# Patient Record
Sex: Female | Born: 1935 | Race: White | Hispanic: No | Smoking: Never smoker
Health system: Southern US, Community
[De-identification: ages and names within clinical notes are randomized; demographics above are authoritative.]

## PROBLEM LIST (undated history)

## (undated) DIAGNOSIS — R140 Abdominal distension (gaseous): Secondary | ICD-10-CM

## (undated) DIAGNOSIS — I10 Essential (primary) hypertension: Secondary | ICD-10-CM

## (undated) DIAGNOSIS — F32A Depression, unspecified: Secondary | ICD-10-CM

## (undated) DIAGNOSIS — H269 Unspecified cataract: Secondary | ICD-10-CM

## (undated) DIAGNOSIS — D649 Anemia, unspecified: Secondary | ICD-10-CM

## (undated) DIAGNOSIS — T7840XA Allergy, unspecified, initial encounter: Secondary | ICD-10-CM

## (undated) DIAGNOSIS — Z5189 Encounter for other specified aftercare: Secondary | ICD-10-CM

## (undated) DIAGNOSIS — F329 Major depressive disorder, single episode, unspecified: Secondary | ICD-10-CM

## (undated) DIAGNOSIS — K625 Hemorrhage of anus and rectum: Secondary | ICD-10-CM

## (undated) DIAGNOSIS — E039 Hypothyroidism, unspecified: Secondary | ICD-10-CM

## (undated) DIAGNOSIS — J449 Chronic obstructive pulmonary disease, unspecified: Secondary | ICD-10-CM

## (undated) DIAGNOSIS — IMO0001 Reserved for inherently not codable concepts without codable children: Secondary | ICD-10-CM

## (undated) DIAGNOSIS — C189 Malignant neoplasm of colon, unspecified: Secondary | ICD-10-CM

## (undated) DIAGNOSIS — F419 Anxiety disorder, unspecified: Secondary | ICD-10-CM

## (undated) DIAGNOSIS — E785 Hyperlipidemia, unspecified: Secondary | ICD-10-CM

## (undated) HISTORY — DX: Hyperlipidemia, unspecified: E78.5

## (undated) HISTORY — PX: TONSILLECTOMY: SUR1361

## (undated) HISTORY — DX: Hemorrhage of anus and rectum: K62.5

## (undated) HISTORY — DX: Allergy, unspecified, initial encounter: T78.40XA

## (undated) HISTORY — DX: Encounter for other specified aftercare: Z51.89

## (undated) HISTORY — DX: Abdominal distension (gaseous): R14.0

## (undated) HISTORY — DX: Unspecified cataract: H26.9

## (undated) HISTORY — DX: Anxiety disorder, unspecified: F41.9

## (undated) HISTORY — PX: BAND HEMORRHOIDECTOMY: SHX1213

## (undated) HISTORY — DX: Depression, unspecified: F32.A

## (undated) HISTORY — DX: Anemia, unspecified: D64.9

## (undated) HISTORY — DX: Reserved for inherently not codable concepts without codable children: IMO0001

## (undated) HISTORY — DX: Chronic obstructive pulmonary disease, unspecified: J44.9

## (undated) HISTORY — PX: COLON SURGERY: SHX602

## (undated) HISTORY — DX: Essential (primary) hypertension: I10

## (undated) HISTORY — DX: Major depressive disorder, single episode, unspecified: F32.9

---

## 1999-12-06 ENCOUNTER — Emergency Department (HOSPITAL_COMMUNITY): Admission: EM | Admit: 1999-12-06 | Discharge: 1999-12-06 | Payer: Self-pay | Admitting: Emergency Medicine

## 1999-12-06 ENCOUNTER — Encounter: Payer: Self-pay | Admitting: Emergency Medicine

## 2000-12-09 ENCOUNTER — Encounter: Admission: RE | Admit: 2000-12-09 | Discharge: 2000-12-09 | Payer: Self-pay

## 2006-05-24 HISTORY — PX: CATARACT EXTRACTION W/ INTRAOCULAR LENS IMPLANT: SHX1309

## 2007-05-25 DIAGNOSIS — Z5189 Encounter for other specified aftercare: Secondary | ICD-10-CM

## 2007-05-25 HISTORY — DX: Encounter for other specified aftercare: Z51.89

## 2009-04-04 ENCOUNTER — Encounter: Admission: RE | Admit: 2009-04-04 | Discharge: 2009-04-04 | Payer: Self-pay | Admitting: Family Medicine

## 2010-04-06 ENCOUNTER — Encounter: Admission: RE | Admit: 2010-04-06 | Discharge: 2010-04-06 | Payer: Self-pay | Admitting: Family Medicine

## 2010-06-15 ENCOUNTER — Encounter: Payer: Self-pay | Admitting: Family Medicine

## 2011-05-25 HISTORY — PX: RIGHT COLECTOMY: SHX853

## 2011-05-28 ENCOUNTER — Other Ambulatory Visit (INDEPENDENT_AMBULATORY_CARE_PROVIDER_SITE_OTHER): Payer: Self-pay

## 2011-05-28 DIAGNOSIS — Z719 Counseling, unspecified: Secondary | ICD-10-CM

## 2011-05-28 DIAGNOSIS — K639 Disease of intestine, unspecified: Secondary | ICD-10-CM

## 2011-05-28 DIAGNOSIS — D259 Leiomyoma of uterus, unspecified: Secondary | ICD-10-CM

## 2011-05-31 ENCOUNTER — Encounter: Payer: Self-pay | Admitting: Gastroenterology

## 2011-05-31 ENCOUNTER — Telehealth (INDEPENDENT_AMBULATORY_CARE_PROVIDER_SITE_OTHER): Payer: Self-pay

## 2011-05-31 NOTE — Telephone Encounter (Signed)
Called Susan Reed to give new appointment date & time, patient will bring CD with imaging from hospital visit last week.  Dr. Elesa Hacker will fax over office notes, diagnostic studies, lab's, etc.  Per Dr. Biagio Quint patient referral to Gastroenterologist and Gynecologist have been ordered and given to our referral coordinator (05/28/2011)

## 2011-06-02 ENCOUNTER — Encounter (INDEPENDENT_AMBULATORY_CARE_PROVIDER_SITE_OTHER): Payer: Self-pay | Admitting: General Surgery

## 2011-06-02 ENCOUNTER — Ambulatory Visit (INDEPENDENT_AMBULATORY_CARE_PROVIDER_SITE_OTHER): Payer: Medicaid Other | Admitting: General Surgery

## 2011-06-02 VITALS — BP 152/74 | HR 80 | Temp 97.3°F | Resp 16 | Ht 61.0 in | Wt 122.4 lb

## 2011-06-02 DIAGNOSIS — K639 Disease of intestine, unspecified: Secondary | ICD-10-CM

## 2011-06-02 DIAGNOSIS — Z789 Other specified health status: Secondary | ICD-10-CM

## 2011-06-02 DIAGNOSIS — Z9189 Other specified personal risk factors, not elsewhere classified: Secondary | ICD-10-CM

## 2011-06-02 DIAGNOSIS — K6389 Other specified diseases of intestine: Secondary | ICD-10-CM

## 2011-06-02 DIAGNOSIS — C189 Malignant neoplasm of colon, unspecified: Secondary | ICD-10-CM

## 2011-06-02 NOTE — Progress Notes (Signed)
Patient ID: Susan Reed, female   DOB: [birth year], 78 y.o.   MRN: [id number]  Chief Complaint  Patient presents with  . Other    new pt- eval cecal mass    HPI Susan Reed is a 78 y.o. female.   HPI   This patient was referred by Dr. Elesa Hacker for evaluation of a right colon mass and cecal mass which was found on CT scan of the abdomen to evaluate for abdominal pain and diarrhea at the current residual emergency room. She states that she has been feeling well until November when she began having diarrhea off and on and she felt that this was due to appreciate or her recent travel to [place]. She has had some black diarrhea as well and she felt that this was due to her current supplementation which she takes for treatment of chronic anemia. She saw her physician who on physical exam noted some right lower quadrant abdominal pain and she was sent to the [facility] ER for evaluation and to rule out appendicitis. CT scan of the abdomen demonstrated thickening of the wall of the cecum with some mesenteric lymph nodes in the right lower quadrant which were enlarged and this was concerning for cecal cancer. She states that she left had a colonoscopy in 2009 but this was not a complete colonoscopy as she describes a poor prep and they were unable to complete the procedure. No followup was recommended for the patient. She denies any family history of colon cancer. She ha bloateds complained of some abdominal pain since November which she describes as "bloating" Past Medical History  Diagnosis Date  . Blood transfusion   . Anemia   . COPD (chronic obstructive pulmonary disease)   . Hyperlipidemia   . Osteoporosis   . Thyroid disease     Past Surgical History  Procedure Date  . Tonsillectomy     Family History  Problem Relation Age of Onset  . Heart disease Father   . Cancer Sister     pt unaware of what kind, but knows it was female reproductive area  . Cancer Brother     lymphoma     Social History History  Substance Use Topics  . Smoking status: Never Smoker   . Smokeless tobacco: Not on file  . Alcohol Use: Yes    Allergies  Allergen Reactions  . Codeine Nausea And Vomiting    Current Outpatient Prescriptions  Medication Sig Dispense Refill  . alendronate (FOSAMAX) 70 MG tablet Take 70 mg by mouth every 7 (seven) days. Take with a full glass of water on an empty stomach.      . Calcium Carbonate-Vit D-Min (CALTRATE PLUS PO) Take by mouth daily.      . Cholecalciferol (VITAMIN D-3 PO) Take by mouth daily.      . ciprofloxacin (CIPRO) 500 MG tablet Take 500 mg by mouth 2 (two) times daily.      . cycloSPORINE (RESTASIS) 0.05 % ophthalmic emulsion 1 drop 2 (two) times daily.      . dorzolamide (TRUSOPT) 2 % ophthalmic solution 1 drop 2 (two) times daily.      . fish oil-omega-3 fatty acids 1000 MG capsule Take 2 g by mouth daily.      . IRON PO Take 65 mg by mouth daily.      Marland Kitchen levothyroxine (SYNTHROID, LEVOTHROID) 25 MCG tablet Take 25 mcg by mouth daily.      Marland Kitchen losartan (COZAAR) 25 MG tablet Take 25 mg by  mouth daily.      . metroNIDAZOLE (FLAGYL) 250 MG tablet Take 250 mg by mouth 2 (two) times daily.      . Multiple Vitamin (MULTIVITAMIN) capsule Take 1 capsule by mouth daily.      Marland Kitchen aspirin 81 MG tablet Take 160 mg by mouth daily.        Review of Systems Review of Systems All other review of systems negative or noncontributory except as stated in the HPI  Blood pressure 152/74, pulse 80, temperature 97.3 F (36.3 C), temperature source Temporal, resp. rate 16, height 5\' 1"  (1.549 m), weight 122 lb 6.4 oz (55.52 kg).  Physical Exam Physical Exam  Vitals reviewed. Constitutional: She is oriented to person, place, and time. She appears well-developed and well-nourished. No distress.  HENT:  Head: Normocephalic and atraumatic.  Mouth/Throat: No oropharyngeal exudate.  Eyes: Conjunctivae are normal. Pupils are equal, round, and reactive to light.  Right eye exhibits no discharge. Left eye exhibits no discharge. No scleral icterus.  Neck: Normal range of motion. No tracheal deviation present.  Cardiovascular: Normal rate, regular rhythm and normal heart sounds.   Pulmonary/Chest: Effort normal and breath sounds normal. No stridor. No respiratory distress. She has no wheezes. She has no rales. She exhibits no tenderness.  Abdominal: Soft. Bowel sounds are normal. She exhibits mass. She exhibits no distension. There is tenderness. There is no rebound and no guarding.       Mild tenderness and palpable mass just inferior and to the right of the umbilicus   Musculoskeletal: Normal range of motion. She exhibits no edema.  Neurological: She is alert and oriented to person, place, and time.  Skin: Skin is warm and dry. No rash noted. She is not diaphoretic. No erythema. No pallor.  Psychiatric: She has a normal mood and affect. Her behavior is normal. Judgment and thought content normal.    Data Reviewed CT, labs  Assessment    Cecal mass.  Likely due to malignancy however, we do not have any pathologic confirmation of this. I recommended GI evaluation and she is due to see Dr. Arlyce Dice tomorrow for further evaluation and hopefully colonoscopy in the near future to obtain biopsies. I will also check a CEA level and given her uterine mass and her family history of uterine cancer in her sister and we also requested that she have a a GYN evaluation.    Plan    She will follow up with me in approximately 2 weeks after her GI evaluation and GYN evaluation. If this is indeed a colon mass or malignancy we will proceed with right colectomy. We discussed the surgery and the risks and we will plan for laparoscopic right hemicolectomy if this is indeed a malignancy.       Lodema Pilot DAVID 06/02/2011, 6:17 PM

## 2011-06-03 ENCOUNTER — Other Ambulatory Visit: Payer: Medicare Other

## 2011-06-03 ENCOUNTER — Encounter: Payer: Self-pay | Admitting: Gastroenterology

## 2011-06-03 ENCOUNTER — Ambulatory Visit (INDEPENDENT_AMBULATORY_CARE_PROVIDER_SITE_OTHER): Payer: Medicare Other | Admitting: Gastroenterology

## 2011-06-03 VITALS — BP 124/62 | HR 60 | Ht 61.0 in | Wt 122.0 lb

## 2011-06-03 DIAGNOSIS — K6389 Other specified diseases of intestine: Secondary | ICD-10-CM

## 2011-06-03 DIAGNOSIS — C189 Malignant neoplasm of colon, unspecified: Secondary | ICD-10-CM

## 2011-06-03 DIAGNOSIS — J449 Chronic obstructive pulmonary disease, unspecified: Secondary | ICD-10-CM | POA: Insufficient documentation

## 2011-06-03 DIAGNOSIS — R933 Abnormal findings on diagnostic imaging of other parts of digestive tract: Secondary | ICD-10-CM

## 2011-06-03 MED ORDER — PEG-KCL-NACL-NASULF-NA ASC-C 100 G PO SOLR: 1.0000 | Freq: Once | ORAL | Status: DC

## 2011-06-03 NOTE — Progress Notes (Signed)
History of Present Illness: Susan Reed is a pleasant 78 year old referred at the request of Dr. Nicanor Alcon for evaluation of a cecal mass. Dark stools prompted evaluation at her family practitioner's office resulting in an abdominal CT, which I reviewed, which demonstrates a mass along the medial wall of the asscending colon and cecum suggestive of a neoplasm. Regional lymphadenopathy is also seen. She denies abdominal pain or frank bleeding. Stools are not fully formed. She is taking iron.    Past Medical History  Diagnosis Date  . Blood transfusion   . Anemia   . COPD (chronic obstructive pulmonary disease)   . Hyperlipidemia   . Osteoporosis   . Thyroid disease   . Hypertension   . Glaucoma    Past Surgical History  Procedure Date  . Tonsillectomy    family history includes Cancer in her brother and sister and Heart disease in her father.  There is no history of Colon cancer. Current Outpatient Prescriptions  Medication Sig Dispense Refill  . alendronate (FOSAMAX) 70 MG tablet Take 70 mg by mouth every 7 (seven) days. Take with a full glass of water on an empty stomach.      . Calcium Carbonate-Vit D-Min (CALTRATE PLUS PO) Take by mouth daily.      . Cholecalciferol (VITAMIN D-3 PO) Take by mouth daily.      . ciprofloxacin (CIPRO) 500 MG tablet Take 500 mg by mouth 2 (two) times daily.      . cycloSPORINE (RESTASIS) 0.05 % ophthalmic emulsion 1 drop 2 (two) times daily.      . dorzolamide (TRUSOPT) 2 % ophthalmic solution 1 drop 2 (two) times daily.      . fish oil-omega-3 fatty acids 1000 MG capsule Take 2 g by mouth daily.      . IRON PO Take 65 mg by mouth daily.      Marland Kitchen levothyroxine (SYNTHROID, LEVOTHROID) 25 MCG tablet Take 25 mcg by mouth daily.      Marland Kitchen losartan (COZAAR) 25 MG tablet Take 25 mg by mouth daily.      . metroNIDAZOLE (FLAGYL) 250 MG tablet Take 250 mg by mouth 2 (two) times daily.      . Multiple Vitamin (MULTIVITAMIN) capsule Take 1 capsule by mouth daily.       Marland Kitchen aspirin 81 MG tablet Take 160 mg by mouth daily. ON HOLD       Allergies as of 06/03/2011 - Review Complete 06/03/2011  Allergen Reaction Noted  . Codeine Nausea And Vomiting 06/02/2011    reports that she has never smoked. She has never used smokeless tobacco. She reports that she drinks alcohol. She reports that she does not use illicit drugs.     Review of Systems: Pertinent positive and negative review of systems were noted in the above HPI section. All other review of systems were otherwise negative.  Vital signs were reviewed in today's medical record Physical Exam: General: Well developed , well nourished, no acute distress Head: Normocephalic and atraumatic Eyes:  sclerae anicteric, EOMI Ears: Normal auditory acuity Mouth: No deformity or lesions Neck: Supple, no masses or thyromegaly Lungs: Clear throughout to auscultation Heart: Regular rate and rhythm; no murmurs, rubs or bruits Abdomen: Soft, non tender and non distended. No masses, hepatosplenomegaly or hernias noted. Normal Bowel sounds Rectal:deferred Musculoskeletal: Symmetrical with no gross deformities  Skin: No lesions on visible extremities Pulses:  Normal pulses noted Extremities: No clubbing, cyanosis, edema or deformities noted Neurological: Alert oriented x 4, grossly nonfocal  Cervical Nodes:  No significant cervical adenopathy Inguinal Nodes: No significant inguinal adenopathy Psychological:  Alert and cooperative. Normal mood and affect

## 2011-06-03 NOTE — Patient Instructions (Signed)
Colonoscopy A colonoscopy is an exam to evaluate your entire colon. In this exam, your colon is cleansed. A long fiberoptic tube is inserted through your rectum and into your colon. The fiberoptic scope (endoscope) is a long bundle of enclosed and very flexible fibers. These fibers transmit light to the area examined and send images from that area to your caregiver. Discomfort is usually minimal. You may be given a drug to help you sleep (sedative) during or prior to the procedure. This exam helps to detect lumps (tumors), polyps, inflammation, and areas of bleeding. Your caregiver may also take a small piece of tissue (biopsy) that will be examined under a microscope. LET YOUR CAREGIVER KNOW ABOUT:   Allergies to food or medicine.   Medicines taken, including vitamins, herbs, eyedrops, over-the-counter medicines, and creams.   Use of steroids (by mouth or creams).   Previous problems with anesthetics or numbing medicines.   History of bleeding problems or blood clots.   Previous surgery.   Other health problems, including diabetes and kidney problems.   Possibility of pregnancy, if this applies.    GO TO THE LAB TODAY YOUR COLONOSCOPY IS SCHEDULED ON 06/08/2011 AT 3PM BEFORE THE PROCEDURE   A clear liquid diet may be required for 2 days before the exam.   Ask your caregiver about changing or stopping your regular medications.   Liquid injections (enemas) or laxatives may be required.   A large amount of electrolyte solution may be given to you to drink over a short period of time. This solution is used to clean out your colon.   You should be present 60 minutes prior to your procedure or as directed by your caregiver.  AFTER THE PROCEDURE   If you received a sedative or pain relieving medication, you will need to arrange for someone to drive you home.   Occasionally, there is a little blood passed with the first bowel movement. Do not be concerned.  FINDING OUT THE RESULTS OF  YOUR TEST Not all test results are available during your visit. If your test results are not back during the visit, make an appointment with your caregiver to find out the results. Do not assume everything is normal if you have not heard from your caregiver or the medical facility. It is important for you to follow up on all of your test results. HOME CARE INSTRUCTIONS   It is not unusual to pass moderate amounts of gas and experience mild abdominal cramping following the procedure. This is due to air being used to inflate your colon during the exam. Walking or a warm pack on your belly (abdomen) may help.   You may resume all normal meals and activities after sedatives and medicines have worn off.   Only take over-the-counter or prescription medicines for pain, discomfort, or fever as directed by your caregiver. Do not use aspirin or blood thinners if a biopsy was taken. Consult your caregiver for medicine usage if biopsies were taken.  SEEK IMMEDIATE MEDICAL CARE IF:   You have a fever.   You pass large blood clots or fill a toilet with blood following the procedure. This may also occur 10 to 14 days following the procedure. This is more likely if a biopsy was taken.   You develop abdominal pain that keeps getting worse and cannot be relieved with medicine.  Document Released: 05/07/2000 Document Revised: 01/20/2011 Document Reviewed: 12/21/2007 [facility] Patient Information 2012 [place], [place].

## 2011-06-03 NOTE — Assessment & Plan Note (Signed)
CT findings are very suspicious for a neoplasm involving the right colon.  Recommendations #1 colonoscopy

## 2011-06-08 ENCOUNTER — Ambulatory Visit (AMBULATORY_SURGERY_CENTER): Payer: Medicare Other | Admitting: Gastroenterology

## 2011-06-08 ENCOUNTER — Encounter: Payer: Self-pay | Admitting: Gastroenterology

## 2011-06-08 DIAGNOSIS — K648 Other hemorrhoids: Secondary | ICD-10-CM

## 2011-06-08 DIAGNOSIS — K5730 Diverticulosis of large intestine without perforation or abscess without bleeding: Secondary | ICD-10-CM

## 2011-06-08 DIAGNOSIS — C189 Malignant neoplasm of colon, unspecified: Secondary | ICD-10-CM

## 2011-06-08 DIAGNOSIS — K6389 Other specified diseases of intestine: Secondary | ICD-10-CM

## 2011-06-08 MED ORDER — SODIUM CHLORIDE 0.9 % IV SOLN: 500.0000 mL | INTRAVENOUS | Status: DC

## 2011-06-08 NOTE — Progress Notes (Signed)
The pt tolerated the egd very well. Maw  Propofol administered by Iline Oven, CRNA. Maw  Hung 2nd bag of n.s. 0.9% At 15:22. Maw  The pt tolerated the colonoscopy very well. maw

## 2011-06-08 NOTE — Progress Notes (Signed)
Patient did not have preoperative order for IV antibiotic SSI prophylaxis. (G8918)  Patient did not experience any of the following events: a burn prior to discharge; a fall within the facility; wrong site/side/patient/procedure/implant event; or a hospital transfer or hospital admission upon discharge from the facility. (G8907)  

## 2011-06-08 NOTE — Patient Instructions (Signed)
FOLLOW DISCHARGE INSTRUCTIONS (BLUE AND GREEN SHEETS). SHEET FOR HIGH FIBER DIET GIVEN, SHEET RE DIVERTICULOSIS AND HEMORRHOIDS GIVEN

## 2011-06-08 NOTE — Op Note (Addendum)
[facility] [address] [place], [place]  [address]  COLONOSCOPY PROCEDURE REPORT  PATIENT:  Susan, Reed  MR#:  [id number] BIRTHDATE:  1924-06-01, 86 yrs. old  GENDER:  female ENDOSCOPIST:  Barbette Hair. Arlyce Dice, MD REF. BY: PROCEDURE DATE:  06/08/2011 PROCEDURE:  Colonoscopy with biopsy ASA CLASS:  Class II INDICATIONS:  Abnormal CT of abdomen MEDICATIONS:   MAC sedation, administered by CRNA propofol 200mg IV  DESCRIPTION OF PROCEDURE:   After the risks benefits and alternatives of the procedure were thoroughly explained, informed consent was obtained.  Digital rectal exam was performed and revealed no abnormalities.   The LB CF-[id number] endoscope was introduced through the anus and advanced to the cecum, which was identified by the ileocecal valve, without limitations.  The quality of the prep was good, using MoviPrep.  The instrument was then slowly withdrawn as the colon was fully examined. <<PROCEDUREIMAGES>>  FINDINGS:  A mass was found in the right colon. Malignant appearing obstructing partially cicumferential mass in the proximal right colon extending to the cecum. Mucosa is friable. Bxs taken (see image6 and image7).  Severe diverticulosis was found in the sigmoid colon (see image9).  Internal Hemorrhoids were found (see image10).  This was otherwise a normal examination of the colon.   Retroflexed views in the rectum revealed no abnormalities.    The time to cecum =  1) 15.0  minutes. The scope was then withdrawn in  1) 6.25  minutes from the cecum and the procedure completed. COMPLICATIONS:  None ENDOSCOPIC IMPRESSION: 1) Mass in the right colon 2) Severe diverticulosis in the sigmoid colon 3) Internal hemorrhoids 4) Otherwise normal examination RECOMMENDATIONS: 1) Surgery REPEAT EXAM:  1 year  ______________________________ Barbette Hair. Arlyce Dice, MD  CC:  Lodema Pilot DO  n. REVISED:  06/08/2011 03:44 PM eSIGNED:   Barbette Hair. Kaplan at  06/08/2011 03:44 PM  Vinnie Level, [phone]

## 2011-06-09 ENCOUNTER — Telehealth: Payer: Self-pay | Admitting: *Deleted

## 2011-06-09 NOTE — Telephone Encounter (Signed)

## 2011-06-10 ENCOUNTER — Telehealth: Payer: Self-pay | Admitting: Gastroenterology

## 2011-06-10 NOTE — Telephone Encounter (Signed)
Pt states she was told to stay away from ASA for 2 weeks. Pt wants to know if she needs to not take the ASA for 2 weeks. Pt instructed to hold ASA for 2 weeks as instructed following her procedure.

## 2011-06-11 ENCOUNTER — Ambulatory Visit (INDEPENDENT_AMBULATORY_CARE_PROVIDER_SITE_OTHER): Payer: Self-pay | Admitting: General Surgery

## 2011-06-15 ENCOUNTER — Encounter (HOSPITAL_COMMUNITY): Payer: Self-pay | Admitting: Pharmacy Technician

## 2011-06-16 ENCOUNTER — Telehealth (INDEPENDENT_AMBULATORY_CARE_PROVIDER_SITE_OTHER): Payer: Self-pay

## 2011-06-16 NOTE — Telephone Encounter (Signed)
Spoke to Ms. Diggins to confirm her OB/GYN appointment on 06/17/11 @ 12:45 w/Dr. Catalina Antigua at [facility]

## 2011-06-17 ENCOUNTER — Encounter (INDEPENDENT_AMBULATORY_CARE_PROVIDER_SITE_OTHER): Payer: Self-pay | Admitting: General Surgery

## 2011-06-17 ENCOUNTER — Ambulatory Visit (INDEPENDENT_AMBULATORY_CARE_PROVIDER_SITE_OTHER): Payer: Medicare Other | Admitting: Obstetrics and Gynecology

## 2011-06-17 ENCOUNTER — Ambulatory Visit (INDEPENDENT_AMBULATORY_CARE_PROVIDER_SITE_OTHER): Payer: Medicare Other | Admitting: General Surgery

## 2011-06-17 ENCOUNTER — Encounter: Payer: Self-pay | Admitting: Obstetrics and Gynecology

## 2011-06-17 VITALS — BP 102/70 | Temp 97.6°F | Resp 16 | Wt 120.0 lb

## 2011-06-17 DIAGNOSIS — C189 Malignant neoplasm of colon, unspecified: Secondary | ICD-10-CM | POA: Insufficient documentation

## 2011-06-17 DIAGNOSIS — N858 Other specified noninflammatory disorders of uterus: Secondary | ICD-10-CM

## 2011-06-17 DIAGNOSIS — N9489 Other specified conditions associated with female genital organs and menstrual cycle: Secondary | ICD-10-CM

## 2011-06-17 NOTE — Progress Notes (Signed)
  Subjective:    Patient ID: Susan Reed, female    DOB: August 29, 1924, 78 y.o.   MRN: [id number]  HPI 78 yo [id number]  Postmenopausal for greater than 30 years presenting today as a referral from [facility]. Patient has been recently diagnosed with colon cancer and is scheduled for surgery on 06/24/2011. In the process of her work-up, patient had a CT scan which demonstrated a 4 cm fundal uterine mass. Patient was referred for GYN evaluation in event that a concomitant gyn surgery may be required. Patient denies any h/o pelvic pain (other than recently) and most importantly denies postmenopausal bleeding. Patient is currently without any GYN complaints   Review of Systems     Objective:   Physical Exam  GENERAL: Well-developed, well-nourished female in no acute distress.  HEENT: Normocephalic, atraumatic. Sclerae anicteric.  NECK: Supple. Normal thyroid.  LUNGS: Clear to auscultation bilaterally.  HEART: Regular rate and rhythm. ABDOMEN: Soft, nontender, nondistended. No organomegaly. PELVIC: Normal external female genitalia. Vagina is atrophic.  No discharge. Normal appearing cervix. Very small uterus is normal in size. No adnexal mass or tenderness. EXTREMITIES: No cyanosis, clubbing, or edema, 2+ distal pulses.     Assessment & Plan:  78 yo [id number] with colon cancer and ? Uterine mass seen on CT scan - Benign GYN exam today - CT findins could be a fibroid for which surgical intervention is not needed - Will schedule ultrasound to better evaluate uterus and most importantly endometrial lining - Patient will be contacted if further intervention is required prior to her surgery.

## 2011-06-17 NOTE — Progress Notes (Signed)
Subjective:     Patient ID: Susan Reed, female   DOB: [birth year], 78 y.o.   MRN: [id number]  HPI This patient follows up after colonoscopy for a cecal mass found on CT scan.  She is doing well and her see results were shared. They were consistent with adenocarcinoma of ascending colon.  We have her scheduled for surgery next Thursday. She met with Dr. Jolayne Panther earlier today for evaluation of the 4 cm  uterine mass and I reviewed her to stay. She feels that this is most likely a benign leiomyoma but she said her up for an ultrasound tomorrow. CEA level was 2.1 Review of Systems     Objective:   Physical Exam No distress and nontoxic-appearing abdomen is benign no masses.    Assessment:     Right colon cancer I discussed with her the options for laparoscopic open repair and we are planning for laparoscopic right hemicolectomy. I discussed with her the risks of surgery including infection, bleeding, pain, scarring, recurrence, need for open surgery, anastomotic leaks and possible need for ostomy and she expressed understanding and desires to proceed with planned procedure. We will have her on clear liquids for 2 days prior to surgery and plan for right colectomy next Thursday.    Plan:     Laparoscopic right hemicolectomy next week.

## 2011-06-18 ENCOUNTER — Ambulatory Visit (HOSPITAL_COMMUNITY): Payer: Medicare Other

## 2011-06-18 ENCOUNTER — Telehealth: Payer: Self-pay | Admitting: Oncology

## 2011-06-18 NOTE — Telephone Encounter (Signed)
S/w pt today re appt for 2/19 w/BS. Per 1/24 NP pof see BS 2wks after surgery scheduled for 1/31. Date per pt.

## 2011-06-22 ENCOUNTER — Ambulatory Visit (HOSPITAL_COMMUNITY)
Admission: RE | Admit: 2011-06-22 | Discharge: 2011-06-22 | Disposition: A | Discharge status: Home / Self Care | Payer: Medicare Other | Source: Ambulatory Visit | Attending: Surgery | Admitting: Surgery

## 2011-06-22 ENCOUNTER — Encounter (HOSPITAL_COMMUNITY): Payer: Self-pay

## 2011-06-22 ENCOUNTER — Encounter (HOSPITAL_COMMUNITY)
Admission: RE | Admit: 2011-06-22 | Discharge: 2011-06-22 | Disposition: A | Discharge status: Home / Self Care | Payer: Medicare Other | Source: Ambulatory Visit | Attending: General Surgery | Admitting: General Surgery

## 2011-06-22 ENCOUNTER — Ambulatory Visit (HOSPITAL_COMMUNITY)
Admission: RE | Admit: 2011-06-22 | Discharge: 2011-06-22 | Disposition: A | Discharge status: Home / Self Care | Payer: Medicare Other | Source: Ambulatory Visit | Attending: Obstetrics and Gynecology | Admitting: Obstetrics and Gynecology

## 2011-06-22 ENCOUNTER — Encounter: Payer: Self-pay | Admitting: Obstetrics and Gynecology

## 2011-06-22 ENCOUNTER — Other Ambulatory Visit: Payer: Self-pay

## 2011-06-22 DIAGNOSIS — Z780 Asymptomatic menopausal state: Secondary | ICD-10-CM | POA: Insufficient documentation

## 2011-06-22 DIAGNOSIS — C189 Malignant neoplasm of colon, unspecified: Secondary | ICD-10-CM

## 2011-06-22 DIAGNOSIS — N858 Other specified noninflammatory disorders of uterus: Secondary | ICD-10-CM

## 2011-06-22 DIAGNOSIS — D252 Subserosal leiomyoma of uterus: Secondary | ICD-10-CM | POA: Insufficient documentation

## 2011-06-22 HISTORY — DX: Hypothyroidism, unspecified: E03.9

## 2011-06-22 LAB — BASIC METABOLIC PANEL
BUN: 12 mg/dL (ref 6–23)
GFR calc Af Amer: >90 mL/min (ref >90)
GFR calc non Af Amer: 79 mL/min — ABNORMAL LOW (ref >90)
Potassium: 4 mEq/L (ref 3.5–5.1)
Sodium: 137 mEq/L (ref 135–145)

## 2011-06-22 LAB — CBC
Hemoglobin: 10.4 g/dL — ABNORMAL LOW (ref 12.0–15.0)
MCHC: 30.9 g/dL (ref 30.0–36.0)
Platelets: 218 10*3/uL (ref 150–400)
RDW: 15.5 % (ref 11.5–15.5)

## 2011-06-22 LAB — SURGICAL PCR SCREEN: MRSA, PCR: NEGATIVE

## 2011-06-22 NOTE — Patient Instructions (Signed)
20 Susan Reed  06/22/2011   Your procedure is scheduled on:  06/24/11 0730-1030  Report to [facility] at 0530 AM.  Call this number if you have problems the morning of surgery: [phone]   Remember: 2 Day Bowel Prep    Do not eat food:After Midnight.  May have clear liquids:until Midnight .    Take these medicines the morning of surgery with A SIP OF WATER:    Do not wear jewelry, make-up or nail polish.  Do not wear lotions, powders, or perfumes.  Do not shave 48 hours prior to surgery.  Do not bring valuables to the hospital.  Contacts, dentures or bridgework may not be worn into surgery.  Leave suitcase in the car. After surgery it may be brought to your room.  For patients admitted to the hospital, checkout time is 11:00 AM the day of discharge.   Special Instructions: CHG Shower Use Special Wash: 1/2 bottle night before surgery and 1/2 bottle morning of surgery. shower chin to toes with CHG.  Wash face and private parts with regular soap.    Please read over the following fact sheets that you were given: MRSA Information, coughing and deep breathing exercises , leg exercises

## 2011-06-22 NOTE — Progress Notes (Incomplete)
06/22/2011 ultrasound reviewed which demonstrated a small uterus with a pedunculated fibroid, normal ovaries and thin endometrial lining.  In view of the thin endometrium and no history of postmenopausal vaginal bleeding, there is no indication for endometrial biopsy or gynecologic surgery. The presence of fibroids in the absence of symptoms is not an indication for surgery

## 2011-06-23 ENCOUNTER — Encounter (HOSPITAL_COMMUNITY): Payer: Self-pay

## 2011-06-23 NOTE — Pre-Procedure Instructions (Signed)
06/23/11 Nurse called [facility] and spoke with French Ana.  Nurse entered labs per anesthesia under Dr Gerrit Friends in error.  French Ana stated that was fine

## 2011-06-24 ENCOUNTER — Other Ambulatory Visit (INDEPENDENT_AMBULATORY_CARE_PROVIDER_SITE_OTHER): Payer: Self-pay | Admitting: General Surgery

## 2011-06-24 ENCOUNTER — Inpatient Hospital Stay (HOSPITAL_COMMUNITY)
Admission: RE | Admit: 2011-06-24 | Discharge: 2011-06-28 | DRG: 331 | Disposition: A | Discharge status: Home / Self Care | Payer: Medicare Other | Source: Ambulatory Visit | Attending: General Surgery | Admitting: General Surgery

## 2011-06-24 ENCOUNTER — Inpatient Hospital Stay (HOSPITAL_COMMUNITY): Payer: Medicare Other | Admitting: Anesthesiology

## 2011-06-24 ENCOUNTER — Other Ambulatory Visit: Payer: Self-pay

## 2011-06-24 ENCOUNTER — Encounter (HOSPITAL_COMMUNITY): Payer: Self-pay | Admitting: Anesthesiology

## 2011-06-24 ENCOUNTER — Encounter (HOSPITAL_COMMUNITY)
Admission: RE | Disposition: A | Discharge status: Home / Self Care | Payer: Self-pay | Source: Ambulatory Visit | Attending: General Surgery

## 2011-06-24 ENCOUNTER — Encounter (HOSPITAL_COMMUNITY): Payer: Self-pay | Admitting: *Deleted

## 2011-06-24 DIAGNOSIS — I10 Essential (primary) hypertension: Secondary | ICD-10-CM | POA: Diagnosis present

## 2011-06-24 DIAGNOSIS — C182 Malignant neoplasm of ascending colon: Secondary | ICD-10-CM

## 2011-06-24 DIAGNOSIS — E785 Hyperlipidemia, unspecified: Secondary | ICD-10-CM | POA: Diagnosis present

## 2011-06-24 DIAGNOSIS — Z8049 Family history of malignant neoplasm of other genital organs: Secondary | ICD-10-CM

## 2011-06-24 DIAGNOSIS — Z01812 Encounter for preprocedural laboratory examination: Secondary | ICD-10-CM

## 2011-06-24 DIAGNOSIS — D259 Leiomyoma of uterus, unspecified: Secondary | ICD-10-CM | POA: Diagnosis present

## 2011-06-24 DIAGNOSIS — I4949 Other premature depolarization: Secondary | ICD-10-CM | POA: Diagnosis not present

## 2011-06-24 DIAGNOSIS — H409 Unspecified glaucoma: Secondary | ICD-10-CM | POA: Diagnosis present

## 2011-06-24 DIAGNOSIS — Z7982 Long term (current) use of aspirin: Secondary | ICD-10-CM

## 2011-06-24 DIAGNOSIS — J449 Chronic obstructive pulmonary disease, unspecified: Secondary | ICD-10-CM | POA: Diagnosis present

## 2011-06-24 DIAGNOSIS — Z01810 Encounter for preprocedural cardiovascular examination: Secondary | ICD-10-CM

## 2011-06-24 DIAGNOSIS — R599 Enlarged lymph nodes, unspecified: Secondary | ICD-10-CM | POA: Diagnosis present

## 2011-06-24 DIAGNOSIS — C180 Malignant neoplasm of cecum: Secondary | ICD-10-CM

## 2011-06-24 DIAGNOSIS — K660 Peritoneal adhesions (postprocedural) (postinfection): Secondary | ICD-10-CM | POA: Diagnosis present

## 2011-06-24 DIAGNOSIS — R040 Epistaxis: Secondary | ICD-10-CM | POA: Diagnosis not present

## 2011-06-24 DIAGNOSIS — D649 Anemia, unspecified: Secondary | ICD-10-CM | POA: Diagnosis present

## 2011-06-24 DIAGNOSIS — M810 Age-related osteoporosis without current pathological fracture: Secondary | ICD-10-CM | POA: Diagnosis present

## 2011-06-24 DIAGNOSIS — C189 Malignant neoplasm of colon, unspecified: Secondary | ICD-10-CM

## 2011-06-24 DIAGNOSIS — J4489 Other specified chronic obstructive pulmonary disease: Secondary | ICD-10-CM | POA: Diagnosis present

## 2011-06-24 DIAGNOSIS — E039 Hypothyroidism, unspecified: Secondary | ICD-10-CM | POA: Diagnosis present

## 2011-06-24 DIAGNOSIS — Z79899 Other long term (current) drug therapy: Secondary | ICD-10-CM

## 2011-06-24 DIAGNOSIS — Z01818 Encounter for other preprocedural examination: Secondary | ICD-10-CM

## 2011-06-24 DIAGNOSIS — R748 Abnormal levels of other serum enzymes: Secondary | ICD-10-CM | POA: Diagnosis not present

## 2011-06-24 DIAGNOSIS — I493 Ventricular premature depolarization: Secondary | ICD-10-CM

## 2011-06-24 HISTORY — DX: Malignant neoplasm of colon, unspecified: C18.9

## 2011-06-24 LAB — BASIC METABOLIC PANEL
BUN: 8 mg/dL (ref 6–23)
CO2: 25 mEq/L (ref 19–32)
Calcium: 8.1 mg/dL — ABNORMAL LOW (ref 8.4–10.5)
Chloride: 104 mEq/L (ref 96–112)
Creatinine, Ser: 0.62 mg/dL (ref 0.50–1.10)
Glucose, Bld: 136 mg/dL — ABNORMAL HIGH (ref 70–99)

## 2011-06-24 LAB — CARDIAC PANEL(CRET KIN+CKTOT+MB+TROPI)
CK, MB: 4.2 ng/mL — ABNORMAL HIGH (ref 0.3–4.0)
Relative Index: 2.3 (ref 0.0–2.5)
Troponin I: 0.41 ng/mL (ref <0.30)

## 2011-06-24 LAB — TYPE AND SCREEN
ABO/RH(D): O POS
Antibody Screen: NEGATIVE

## 2011-06-24 SURGERY — LAPAROSCOPIC RIGHT HEMI COLECTOMY
Anesthesia: General | Site: Abdomen | Laterality: Right

## 2011-06-24 MED ORDER — LIDOCAINE-EPINEPHRINE (PF) 1 %-1:200000 IJ SOLN
INTRAMUSCULAR | Status: DC | PRN
  Administered 2011-06-24: 10:00:00 via SUBCUTANEOUS

## 2011-06-24 MED ORDER — LOSARTAN POTASSIUM 25 MG PO TABS
25.0000 mg | ORAL_TABLET | Freq: Every day | ORAL | Status: DC
  Administered 2011-06-24: 25 mg via ORAL
  Filled 2011-06-24 (×3): qty 1

## 2011-06-24 MED ORDER — ONDANSETRON HCL 4 MG/2ML IJ SOLN
4.0000 mg | Freq: Four times a day (QID) | INTRAMUSCULAR | Status: DC | PRN
  Administered 2011-06-25: 4 mg via INTRAVENOUS
  Filled 2011-06-24: qty 2

## 2011-06-24 MED ORDER — ENOXAPARIN SODIUM 40 MG/0.4ML ~~LOC~~ SOLN
40.0000 mg | SUBCUTANEOUS | Status: DC
  Administered 2011-06-24 – 2011-06-25 (×2): 40 mg via SUBCUTANEOUS
  Filled 2011-06-24 (×5): qty 0.4

## 2011-06-24 MED ORDER — CISATRACURIUM BESYLATE 2 MG/ML IV SOLN
INTRAVENOUS | Status: DC | PRN
  Administered 2011-06-24: 2 mg via INTRAVENOUS
  Administered 2011-06-24: 4 mg via INTRAVENOUS
  Administered 2011-06-24: 1 mg via INTRAVENOUS
  Administered 2011-06-24: 3 mg via INTRAVENOUS
  Administered 2011-06-24: 2 mg via INTRAVENOUS

## 2011-06-24 MED ORDER — LIDOCAINE HCL (CARDIAC) 20 MG/ML IV SOLN
INTRAVENOUS | Status: DC | PRN
  Administered 2011-06-24: 30 mg via INTRAVENOUS

## 2011-06-24 MED ORDER — ONDANSETRON HCL 4 MG PO TABS: 4.0000 mg | ORAL_TABLET | Freq: Four times a day (QID) | ORAL | Status: DC | PRN

## 2011-06-24 MED ORDER — ACETAMINOPHEN 10 MG/ML IV SOLN
INTRAVENOUS | Status: AC
  Filled 2011-06-24: qty 100

## 2011-06-24 MED ORDER — LACTATED RINGERS IV SOLN
INTRAVENOUS | Status: DC | PRN
  Administered 2011-06-24 (×3): via INTRAVENOUS

## 2011-06-24 MED ORDER — DORZOLAMIDE HCL 2 % OP SOLN
1.0000 [drp] | Freq: Two times a day (BID) | OPHTHALMIC | Status: DC
  Administered 2011-06-24 – 2011-06-28 (×8): 1 [drp] via OPHTHALMIC
  Filled 2011-06-24 (×3): qty 10

## 2011-06-24 MED ORDER — PROPOFOL 10 MG/ML IV EMUL
INTRAVENOUS | Status: DC | PRN
  Administered 2011-06-24: 20 mg via INTRAVENOUS
  Administered 2011-06-24: 80 mg via INTRAVENOUS

## 2011-06-24 MED ORDER — DEXAMETHASONE SODIUM PHOSPHATE 10 MG/ML IJ SOLN
INTRAMUSCULAR | Status: DC | PRN
  Administered 2011-06-24: 5 mg via INTRAVENOUS

## 2011-06-24 MED ORDER — LIDOCAINE-EPINEPHRINE 1 %-1:100000 IJ SOLN
INTRAMUSCULAR | Status: AC
  Filled 2011-06-24: qty 1

## 2011-06-24 MED ORDER — POTASSIUM CHLORIDE IN NACL 20-0.9 MEQ/L-% IV SOLN
INTRAVENOUS | Status: DC
  Administered 2011-06-24: 1000 mL via INTRAVENOUS
  Administered 2011-06-25 (×3): via INTRAVENOUS
  Filled 2011-06-24 (×7): qty 1000

## 2011-06-24 MED ORDER — DROPERIDOL 2.5 MG/ML IJ SOLN
INTRAMUSCULAR | Status: DC | PRN
  Administered 2011-06-24: .5 mg via INTRAVENOUS

## 2011-06-24 MED ORDER — ENOXAPARIN SODIUM 40 MG/0.4ML ~~LOC~~ SOLN
40.0000 mg | Freq: Once | SUBCUTANEOUS | Status: AC
  Administered 2011-06-24: 40 mg via SUBCUTANEOUS

## 2011-06-24 MED ORDER — SODIUM CHLORIDE 0.9 % IV SOLN
1.0000 g | INTRAVENOUS | Status: AC
  Administered 2011-06-24: 1 g via INTRAVENOUS

## 2011-06-24 MED ORDER — BUPIVACAINE HCL 0.25 % IJ SOLN
INTRAMUSCULAR | Status: AC
  Filled 2011-06-24: qty 1

## 2011-06-24 MED ORDER — HETASTARCH-ELECTROLYTES 6 % IV SOLN
INTRAVENOUS | Status: DC | PRN
  Administered 2011-06-24: 10:00:00 via INTRAVENOUS

## 2011-06-24 MED ORDER — HYDROMORPHONE HCL PF 1 MG/ML IJ SOLN
INTRAMUSCULAR | Status: AC
  Filled 2011-06-24: qty 1

## 2011-06-24 MED ORDER — MAGNESIUM SULFATE 40 MG/ML IJ SOLN
2.0000 g | Freq: Once | INTRAMUSCULAR | Status: AC
  Administered 2011-06-24: 2 g via INTRAVENOUS
  Filled 2011-06-24: qty 50

## 2011-06-24 MED ORDER — MORPHINE SULFATE 2 MG/ML IJ SOLN
2.0000 mg | INTRAMUSCULAR | Status: DC | PRN
  Administered 2011-06-24 – 2011-06-25 (×6): 2 mg via INTRAVENOUS
  Filled 2011-06-24 (×6): qty 1

## 2011-06-24 MED ORDER — SODIUM CHLORIDE 0.9 % IV SOLN
1.0000 g | Freq: Once | INTRAVENOUS | Status: AC
  Administered 2011-06-25: 1 g via INTRAVENOUS
  Filled 2011-06-24: qty 1

## 2011-06-24 MED ORDER — HYDROMORPHONE HCL PF 1 MG/ML IJ SOLN
0.2500 mg | INTRAMUSCULAR | Status: DC | PRN
  Administered 2011-06-24: 0.25 mg via INTRAVENOUS

## 2011-06-24 MED ORDER — LEVOTHYROXINE SODIUM 25 MCG PO TABS
25.0000 ug | ORAL_TABLET | Freq: Every day | ORAL | Status: DC
  Administered 2011-06-25 – 2011-06-28 (×4): 25 ug via ORAL
  Filled 2011-06-24 (×4): qty 1

## 2011-06-24 MED ORDER — PHENYLEPHRINE HCL 10 MG/ML IJ SOLN
INTRAMUSCULAR | Status: DC | PRN
  Administered 2011-06-24: 20 ug via INTRAVENOUS
  Administered 2011-06-24: 40 ug via INTRAVENOUS
  Administered 2011-06-24: 20 ug via INTRAVENOUS
  Administered 2011-06-24: 40 ug via INTRAVENOUS

## 2011-06-24 MED ORDER — ACETAMINOPHEN 10 MG/ML IV SOLN
INTRAVENOUS | Status: DC | PRN
  Administered 2011-06-24: 1000 mg via INTRAVENOUS

## 2011-06-24 MED ORDER — FENTANYL CITRATE 0.05 MG/ML IJ SOLN
INTRAMUSCULAR | Status: DC | PRN
  Administered 2011-06-24 (×2): 50 ug via INTRAVENOUS

## 2011-06-24 MED ORDER — PROMETHAZINE HCL 25 MG/ML IJ SOLN: 6.2500 mg | INTRAMUSCULAR | Status: DC | PRN

## 2011-06-24 MED ORDER — SUCCINYLCHOLINE CHLORIDE 20 MG/ML IJ SOLN
INTRAMUSCULAR | Status: DC | PRN
  Administered 2011-06-24: 40 mg via INTRAVENOUS
  Administered 2011-06-24: 80 mg via INTRAVENOUS

## 2011-06-24 MED ORDER — MULTIVITAMINS PO CAPS: 1.0000 | ORAL_CAPSULE | Freq: Every day | ORAL | Status: DC

## 2011-06-24 MED ORDER — NEOSTIGMINE METHYLSULFATE 1 MG/ML IJ SOLN
INTRAMUSCULAR | Status: DC | PRN
  Administered 2011-06-24: 3 mg via INTRAVENOUS

## 2011-06-24 MED ORDER — SODIUM CHLORIDE 0.9 % IV SOLN
INTRAVENOUS | Status: AC
  Filled 2011-06-24: qty 50

## 2011-06-24 MED ORDER — GLYCOPYRROLATE 0.2 MG/ML IJ SOLN
INTRAMUSCULAR | Status: DC | PRN
  Administered 2011-06-24: .4 mg via INTRAVENOUS

## 2011-06-24 MED ORDER — ADULT MULTIVITAMIN W/MINERALS CH
1.0000 | ORAL_TABLET | Freq: Every day | ORAL | Status: DC
  Administered 2011-06-25 – 2011-06-28 (×4): 1 via ORAL
  Filled 2011-06-24 (×5): qty 1

## 2011-06-24 MED ORDER — HYDROMORPHONE HCL PF 1 MG/ML IJ SOLN
INTRAMUSCULAR | Status: DC | PRN
  Administered 2011-06-24: .4 mg via INTRAVENOUS
  Administered 2011-06-24: .3 mg via INTRAVENOUS
  Administered 2011-06-24 (×2): .2 mg via INTRAVENOUS
  Administered 2011-06-24: .4 mg via INTRAVENOUS

## 2011-06-24 MED ORDER — CYCLOSPORINE 0.05 % OP EMUL
1.0000 [drp] | Freq: Two times a day (BID) | OPHTHALMIC | Status: DC
  Administered 2011-06-24 – 2011-06-28 (×8): 1 [drp] via OPHTHALMIC
  Filled 2011-06-24 (×11): qty 1

## 2011-06-24 MED ORDER — ONDANSETRON HCL 4 MG/2ML IJ SOLN
INTRAMUSCULAR | Status: DC | PRN
  Administered 2011-06-24 (×2): 2 mg via INTRAVENOUS

## 2011-06-24 SURGICAL SUPPLY — 82 items
APPLIER CLIP 5 13 M/L LIGAMAX5 (MISCELLANEOUS)
APPLIER CLIP ROT 10 11.4 M/L (STAPLE)
APR CLP MED LRG 11.4X10 (STAPLE)
APR CLP MED LRG 5 ANG JAW (MISCELLANEOUS)
BLADE EXTENDED COATED 6.5IN (ELECTRODE) ×1 IMPLANT
BLADE HEX COATED 2.75 (ELECTRODE) ×2 IMPLANT
BLADE SURG SZ10 CARB STEEL (BLADE) IMPLANT
CANISTER SUCTION 2500CC (MISCELLANEOUS) ×3 IMPLANT
CELLS DAT CNTRL 66122 CELL SVR (MISCELLANEOUS) ×1 IMPLANT
CLAMP ENDO BABCK 10MM (STAPLE) IMPLANT
CLIP APPLIE 5 13 M/L LIGAMAX5 (MISCELLANEOUS) IMPLANT
CLIP APPLIE ROT 10 11.4 M/L (STAPLE) IMPLANT
CLOTH BEACON ORANGE TIMEOUT ST (SAFETY) ×2 IMPLANT
CONNECTOR 5 IN 1 STRAIGHT STRL (MISCELLANEOUS) IMPLANT
COVER MAYO STAND STRL (DRAPES) ×2 IMPLANT
COVER SURGICAL LIGHT HANDLE (MISCELLANEOUS) ×2 IMPLANT
DECANTER SPIKE VIAL GLASS SM (MISCELLANEOUS) ×3 IMPLANT
DEVICE TROCAR PUNCTURE CLOSURE (ENDOMECHANICALS) IMPLANT
DRAPE LAPAROSCOPIC ABDOMINAL (DRAPES) ×2 IMPLANT
DRAPE LG THREE QUARTER DISP (DRAPES) ×1 IMPLANT
DRAPE WARM FLUID 44X44 (DRAPE) ×2 IMPLANT
DRSG PAD ABDOMINAL 8X10 ST (GAUZE/BANDAGES/DRESSINGS) ×1 IMPLANT
ELECT REM PT RETURN 9FT ADLT (ELECTROSURGICAL) ×2
ELECTRODE REM PT RTRN 9FT ADLT (ELECTROSURGICAL) ×1 IMPLANT
ENSEAL DEVICE STD TIP 35CM (ENDOMECHANICALS) ×1 IMPLANT
GAUZE SPONGE 4X4 12PLY STRL LF (GAUZE/BANDAGES/DRESSINGS) ×1 IMPLANT
GLOVE BIOGEL PI IND STRL 7.0 (GLOVE) ×1 IMPLANT
GLOVE BIOGEL PI INDICATOR 7.0 (GLOVE) ×1
GOWN STRL NON-REIN LRG LVL3 (GOWN DISPOSABLE) ×3 IMPLANT
GOWN STRL REIN XL XLG (GOWN DISPOSABLE) ×4 IMPLANT
HAND ACTIVATED (MISCELLANEOUS) ×2 IMPLANT
KIT BASIN OR (CUSTOM PROCEDURE TRAY) ×2 IMPLANT
LEGGING LITHOTOMY PAIR STRL (DRAPES) IMPLANT
LIGASURE IMPACT 36 18CM CVD LR (INSTRUMENTS) ×2 IMPLANT
NS IRRIG 1000ML POUR BTL (IV SOLUTION) ×4 IMPLANT
PENCIL BUTTON HOLSTER BLD 10FT (ELECTRODE) ×2 IMPLANT
RELOAD PROXIMATE 75MM BLUE (ENDOMECHANICALS) ×6 IMPLANT
RELOAD STAPLE 75 3.8 BLU REG (ENDOMECHANICALS) IMPLANT
RETRACTOR WND ALEXIS 18 MED (MISCELLANEOUS) IMPLANT
RTRCTR WOUND ALEXIS 18CM MED (MISCELLANEOUS) ×2
SCISSORS LAP 5X35 DISP (ENDOMECHANICALS) IMPLANT
SET IRRIG TUBING LAPAROSCOPIC (IRRIGATION / IRRIGATOR) ×1 IMPLANT
SLEEVE Z-THREAD 5X100MM (TROCAR) ×2 IMPLANT
SOLUTION ANTI FOG 6CC (MISCELLANEOUS) ×2 IMPLANT
SPONGE GAUZE 4X4 12PLY (GAUZE/BANDAGES/DRESSINGS) ×2 IMPLANT
SPONGE LAP 18X18 X RAY DECT (DISPOSABLE) ×4 IMPLANT
STAPLER GUN LINEAR PROX 60 (STAPLE) ×1 IMPLANT
STAPLER PROXIMATE 75MM BLUE (STAPLE) ×1 IMPLANT
STAPLER VISISTAT 35W (STAPLE) ×2 IMPLANT
STRIP CLOSURE SKIN 1/2X4 (GAUZE/BANDAGES/DRESSINGS) ×1 IMPLANT
SUCTION POOLE TIP (SUCTIONS) ×2 IMPLANT
SUT PDS AB 1 CT1 27 (SUTURE) IMPLANT
SUT PDS AB 1 CTX 36 (SUTURE) IMPLANT
SUT PDS AB 4-0 SH 27 (SUTURE) IMPLANT
SUT PROLENE 2 0 KS (SUTURE) IMPLANT
SUT SILK 2 0 (SUTURE) ×2
SUT SILK 2 0 SH (SUTURE) ×2 IMPLANT
SUT SILK 2 0 SH CR/8 (SUTURE) ×3 IMPLANT
SUT SILK 2-0 18XBRD TIE 12 (SUTURE) ×1 IMPLANT
SUT SILK 3 0 (SUTURE) ×2
SUT SILK 3 0 SH CR/8 (SUTURE) ×2 IMPLANT
SUT SILK 3-0 18XBRD TIE 12 (SUTURE) ×1 IMPLANT
SUT VIC AB 2-0 CT1 27 (SUTURE)
SUT VIC AB 2-0 CT1 27XBRD (SUTURE) IMPLANT
SUT VIC AB 3-0 PS2 18 (SUTURE)
SUT VIC AB 3-0 PS2 18XBRD (SUTURE) IMPLANT
SUT VIC AB 4-0 SH 18 (SUTURE) IMPLANT
SUT VICRYL 0 UR6 27IN ABS (SUTURE) IMPLANT
SYR 30ML LL (SYRINGE) ×2 IMPLANT
SYR BULB IRRIGATION 50ML (SYRINGE) ×2 IMPLANT
TOWEL OR 17X26 10 PK STRL BLUE (TOWEL DISPOSABLE) ×2 IMPLANT
TRAY FOLEY CATH 14FRSI W/METER (CATHETERS) ×2 IMPLANT
TRAY LAP CHOLE (CUSTOM PROCEDURE TRAY) ×2 IMPLANT
TROCAR BLADELESS OPT 5 100 (ENDOMECHANICALS) IMPLANT
TROCAR HASSON GELL 12X100 (TROCAR) IMPLANT
TROCAR Z-THREAD FIOS 11X100 BL (TROCAR) ×1 IMPLANT
TROCAR Z-THREAD FIOS 5X100MM (TROCAR) ×1 IMPLANT
TROCAR Z-THREAD SLEEVE 11X100 (TROCAR) IMPLANT
TUBING CONNECTING 10 (TUBING) IMPLANT
TUBING FILTER THERMOFLATOR (ELECTROSURGICAL) ×2 IMPLANT
YANKAUER SUCT BULB TIP 10FT TU (MISCELLANEOUS) ×2 IMPLANT
YANKAUER SUCT BULB TIP NO VENT (SUCTIONS) ×2 IMPLANT

## 2011-06-24 NOTE — Progress Notes (Signed)
Pt noted to be in ventricular trigeminy on ICU cardiac monitor. No previous mention of abnormal rhythm in history. Dr. Andrey Campanile paged and order for 12 lead EKG, BMET, Mg, Phos and cardiac enzymes X 1. Pt  asymptomatic of rhythm. Will continue to monitor.

## 2011-06-24 NOTE — Brief Op Note (Signed)
06/24/2011  10:57 AM  PATIENT:  Susan Reed  78 y.o. female  PRE-OPERATIVE DIAGNOSIS:  colon cancer  POST-OPERATIVE DIAGNOSIS:  colon cancer  PROCEDURE:  Procedure(s): LAPAROSCOPIC RIGHT HEMI COLECTOMY  SURGEON:  Surgeon(s): Rulon Abide, DO Almond Lint, MD  PHYSICIAN ASSISTANT:   ASSISTANTSDonell Beers   ANESTHESIA:   general  EBL:  Total I/O In: 2200 [I.V.:2000; IV Piggyback:200] Out: 141 [Urine:40; Other:1; Blood:100]  BLOOD ADMINISTERED:none  DRAINS: none   LOCAL MEDICATIONS USED:  MARCAINE 20CC and LIDOCAINE 20CC  SPECIMEN:  Source of Specimen:  right colon and small bowel resection  DISPOSITION OF SPECIMEN:  PATHOLOGY  COUNTS:  YES  TOURNIQUET:  * No tourniquets in log *  DICTATION: .Other Dictation: Dictation Number [id number]  PLAN OF CARE: Admit to inpatient   PATIENT DISPOSITION:  ICU - extubated and stable.   Delay start of Pharmacological VTE agent (>24hrs) due to surgical blood loss or risk of bleeding:  {YES/NO/NOT APPLICABLE:20182

## 2011-06-24 NOTE — H&P (View-Only) (Signed)
Patient ID: Susan Reed, female   DOB: 08/29/1924, 78 y.o.   MRN: [id number]  Chief Complaint  Patient presents with  . Other    new pt- eval cecal mass    HPI Susan Reed is a 78 y.o. female.   HPI   This patient was referred by Dr. Church for evaluation of a right colon mass and cecal mass which was found on CT scan of the abdomen to evaluate for abdominal pain and diarrhea at the current residual emergency room. She states that she has been feeling well until November when she began having diarrhea off and on and she felt that this was due to appreciate or her recent travel to [place]. She has had some black diarrhea as well and she felt that this was due to her current supplementation which she takes for treatment of chronic anemia. She saw her physician who on physical exam noted some right lower quadrant abdominal pain and she was sent to the [place] [facility] ER for evaluation and to rule out appendicitis. CT scan of the abdomen demonstrated thickening of the wall of the cecum with some mesenteric lymph nodes in the right lower quadrant which were enlarged and this was concerning for cecal cancer. She states that she left had a colonoscopy in 2009 but this was not a complete colonoscopy as she describes a poor prep and they were unable to complete the procedure. No followup was recommended for the patient. She denies any family history of colon cancer. She ha bloateds complained of some abdominal pain since November which she describes as "bloating" Past Medical History  Diagnosis Date  . Blood transfusion   . Anemia   . COPD (chronic obstructive pulmonary disease)   . Hyperlipidemia   . Osteoporosis   . Thyroid disease     Past Surgical History  Procedure Date  . Tonsillectomy     Family History  Problem Relation Age of Onset  . Heart disease Father   . Cancer Sister     pt unaware of what kind, but knows it was female reproductive area  . Cancer Brother     lymphoma     Social History History  Substance Use Topics  . Smoking status: Never Smoker   . Smokeless tobacco: Not on file  . Alcohol Use: Yes    Allergies  Allergen Reactions  . Codeine Nausea And Vomiting    Current Outpatient Prescriptions  Medication Sig Dispense Refill  . alendronate (FOSAMAX) 70 MG tablet Take 70 mg by mouth every 7 (seven) days. Take with a full glass of water on an empty stomach.      . Calcium Carbonate-Vit D-Min (CALTRATE PLUS PO) Take by mouth daily.      . Cholecalciferol (VITAMIN D-3 PO) Take by mouth daily.      . ciprofloxacin (CIPRO) 500 MG tablet Take 500 mg by mouth 2 (two) times daily.      . cycloSPORINE (RESTASIS) 0.05 % ophthalmic emulsion 1 drop 2 (two) times daily.      . dorzolamide (TRUSOPT) 2 % ophthalmic solution 1 drop 2 (two) times daily.      . fish oil-omega-3 fatty acids 1000 MG capsule Take 2 g by mouth daily.      . IRON PO Take 65 mg by mouth daily.      . levothyroxine (SYNTHROID, LEVOTHROID) 25 MCG tablet Take 25 mcg by mouth daily.      . losartan (COZAAR) 25 MG tablet Take 25 mg by   mouth daily.      . metroNIDAZOLE (FLAGYL) 250 MG tablet Take 250 mg by mouth 2 (two) times daily.      . Multiple Vitamin (MULTIVITAMIN) capsule Take 1 capsule by mouth daily.      . aspirin 81 MG tablet Take 160 mg by mouth daily.        Review of Systems Review of Systems All other review of systems negative or noncontributory except as stated in the HPI  Blood pressure 152/74, pulse 80, temperature 97.3 F (36.3 C), temperature source Temporal, resp. rate 16, height 5' 1" (1.549 m), weight 122 lb 6.4 oz (55.52 kg).  Physical Exam Physical Exam  Vitals reviewed. Constitutional: She is oriented to person, place, and time. She appears well-developed and well-nourished. No distress.  HENT:  Head: Normocephalic and atraumatic.  Mouth/Throat: No oropharyngeal exudate.  Eyes: Conjunctivae are normal. Pupils are equal, round, and reactive to light.  Right eye exhibits no discharge. Left eye exhibits no discharge. No scleral icterus.  Neck: Normal range of motion. No tracheal deviation present.  Cardiovascular: Normal rate, regular rhythm and normal heart sounds.   Pulmonary/Chest: Effort normal and breath sounds normal. No stridor. No respiratory distress. She has no wheezes. She has no rales. She exhibits no tenderness.  Abdominal: Soft. Bowel sounds are normal. She exhibits mass. She exhibits no distension. There is tenderness. There is no rebound and no guarding.       Mild tenderness and palpable mass just inferior and to the right of the umbilicus   Musculoskeletal: Normal range of motion. She exhibits no edema.  Neurological: She is alert and oriented to person, place, and time.  Skin: Skin is warm and dry. No rash noted. She is not diaphoretic. No erythema. No pallor.  Psychiatric: She has a normal mood and affect. Her behavior is normal. Judgment and thought content normal.    Data Reviewed CT, labs  Assessment    Cecal mass.  Likely due to malignancy however, we do not have any pathologic confirmation of this. I recommended GI evaluation and she is due to see Dr. Kaplan tomorrow for further evaluation and hopefully colonoscopy in the near future to obtain biopsies. I will also check a CEA level and given her uterine mass and her family history of uterine cancer in her sister and we also requested that she have a a GYN evaluation.    Plan    She will follow up with me in approximately 2 weeks after her GI evaluation and GYN evaluation. If this is indeed a colon mass or malignancy we will proceed with right colectomy. We discussed the surgery and the risks and we will plan for laparoscopic right hemicolectomy if this is indeed a malignancy.       Aarianna Hoadley DAVID 06/02/2011, 6:17 PM    

## 2011-06-24 NOTE — Anesthesia Preprocedure Evaluation (Signed)
Anesthesia Evaluation  Patient identified by MRN, date of birth, ID band Patient awake    Reviewed: Allergy & Precautions, H&P , NPO status , Patient's Chart, lab work & pertinent test results  Airway Mallampati: II TM Distance: >3 FB Neck ROM: Full    Dental No notable dental hx.    Pulmonary COPD clear to auscultation  Pulmonary exam normal       Cardiovascular hypertension, Regular Normal    Neuro/Psych Negative Neurological ROS  Negative Psych ROS   GI/Hepatic negative GI ROS, Neg liver ROS,   Endo/Other  Negative Endocrine ROSHypothyroidism   Renal/GU negative Renal ROS  Genitourinary negative   Musculoskeletal negative musculoskeletal ROS (+)   Abdominal   Peds negative pediatric ROS (+)  Hematology negative hematology ROS (+)   Anesthesia Other Findings   Reproductive/Obstetrics negative OB ROS                           Anesthesia Physical Anesthesia Plan  ASA: II  Anesthesia Plan: General   Post-op Pain Management:    Induction: Intravenous  Airway Management Planned: Oral ETT  Additional Equipment:   Intra-op Plan:   Post-operative Plan: Extubation in OR  Informed Consent: I have reviewed the patients History and Physical, chart, labs and discussed the procedure including the risks, benefits and alternatives for the proposed anesthesia with the patient or authorized representative who has indicated his/her understanding and acceptance.   Dental advisory given  Plan Discussed with: CRNA  Anesthesia Plan Comments:         Anesthesia Quick Evaluation

## 2011-06-24 NOTE — Anesthesia Postprocedure Evaluation (Signed)
  Anesthesia Post-op Note  Patient: Susan Reed  Procedure(s) Performed:  LAPAROSCOPIC RIGHT HEMI COLECTOMY - Laparoscopic Right Hemicolectomy and small bowel rescection  Patient Location: PACU  Anesthesia Type: General  Reed of Consciousness: awake and alert   Airway and Oxygen Therapy: Patient Spontanous Breathing  Post-op Pain: mild  Post-op Assessment: Post-op Vital signs reviewed, Patient's Cardiovascular Status Stable, Respiratory Function Stable, Patent Airway and No signs of Nausea or vomiting  Post-op Vital Signs: stable  Complications: No apparent anesthesia complications:  DIFFICULT AIRWAY/GLIDESCOPE UTILIZED

## 2011-06-24 NOTE — Interval H&P Note (Signed)
History and Physical Interval Note:  06/24/2011 7:09 AM  Susan Reed  has presented today for surgery, with the diagnosis of colon cancer  The various methods of treatment have been discussed with the patient and family. After consideration of risks, benefits and other options for treatment, the patient has consented to  Procedure(s): LAPAROSCOPIC RIGHT HEMI COLECTOMY as a surgical intervention .  The patients' history has been reviewed, patient examined, no change in status, stable for surgery.  I have reviewed the patients' chart and labs.  Questions were answered to the patient's satisfaction.  Pt has seen Dr. 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  We again discussed the risks of infection, bleeding, pain, scarring, recurrence, injury to bowel or ureter, need for open surgery, and anastamotic leaks all discussed and she desires to proceed with lap right hemicolectomy.   Lodema Pilot DAVID

## 2011-06-24 NOTE — Transfer of Care (Signed)
Immediate Anesthesia Transfer of Care Note  Patient: Susan Reed  Procedure(s) Performed:  LAPAROSCOPIC RIGHT HEMI COLECTOMY - Laparoscopic Right Hemicolectomy and small bowel rescection  Patient Location: PACU  Anesthesia Type: General  Reed of Consciousness: awake and alert   Airway & Oxygen Therapy: Patient Spontanous Breathing and Patient connected to face mask oxygen  Post-op Assessment: Report given to PACU RN and Post -op Vital signs reviewed and stable  Post vital signs: Reviewed and stable  Complications: No apparent anesthesia complications

## 2011-06-25 ENCOUNTER — Encounter (HOSPITAL_COMMUNITY): Payer: Self-pay | Admitting: Cardiology

## 2011-06-25 DIAGNOSIS — I10 Essential (primary) hypertension: Secondary | ICD-10-CM | POA: Insufficient documentation

## 2011-06-25 DIAGNOSIS — E039 Hypothyroidism, unspecified: Secondary | ICD-10-CM | POA: Insufficient documentation

## 2011-06-25 DIAGNOSIS — I493 Ventricular premature depolarization: Secondary | ICD-10-CM

## 2011-06-25 LAB — COMPREHENSIVE METABOLIC PANEL
ALT: 11 U/L (ref 0–35)
AST: 20 U/L (ref 0–37)
Alkaline Phosphatase: 64 U/L (ref 39–117)
Calcium: 7.8 mg/dL — ABNORMAL LOW (ref 8.4–10.5)
Glucose, Bld: 118 mg/dL — ABNORMAL HIGH (ref 70–99)
Potassium: 4.6 mEq/L (ref 3.5–5.1)
Sodium: 137 mEq/L (ref 135–145)
Total Protein: 5.9 g/dL — ABNORMAL LOW (ref 6.0–8.3)

## 2011-06-25 LAB — CBC
Hemoglobin: 8.8 g/dL — ABNORMAL LOW (ref 12.0–15.0)
MCH: 27.2 pg (ref 26.0–34.0)
MCHC: 30.2 g/dL (ref 30.0–36.0)
Platelets: 208 10*3/uL (ref 150–400)
RBC: 3.23 MIL/uL — ABNORMAL LOW (ref 3.87–5.11)

## 2011-06-25 LAB — CARDIAC PANEL(CRET KIN+CKTOT+MB+TROPI)
CK, MB: 4.1 ng/mL — ABNORMAL HIGH (ref 0.3–4.0)
CK, MB: 3.5 ng/mL (ref 0.3–4.0)
Total CK: 260 U/L — ABNORMAL HIGH (ref 7–177)
Troponin I: 0.66 ng/mL (ref <0.30)

## 2011-06-25 MED ORDER — METOPROLOL TARTRATE 25 MG PO TABS
25.0000 mg | ORAL_TABLET | Freq: Two times a day (BID) | ORAL | Status: DC
  Administered 2011-06-25 – 2011-06-28 (×6): 25 mg via ORAL
  Filled 2011-06-25 (×9): qty 1

## 2011-06-25 NOTE — Progress Notes (Signed)
1 Day Post-Op  Subjective: Had some trigeminy overnight, asymptomatic.  Feels weak but otherwise no chest pain.  Pain controlled  Objective: Vital signs in last 24 hours: Temp:  [94 F (34.4 C)-98.1 F (36.7 C)] 98 F (36.7 C) (02/01 0400) Pulse Rate:  [31-92] 87  (02/01 0500) Resp:  [8-26] 16  (02/01 0500) BP: (124-159)/(37-80) 126/52 mmHg (02/01 0500) SpO2:  [95 %-100 %] 99 % (02/01 0500) Weight:  [129 lb 9.6 oz (58.786 kg)] 129 lb 9.6 oz (58.786 kg) (01/31 1200) Last BM Date:  (prior to admission )  Intake/Output from previous day: 01/31 0701 - 02/01 0700 In: 4400 [I.V.:4200; IV Piggyback:200] Out: 1071 [Urine:970; Blood:100] Intake/Output this shift:    General appearance: alert, cooperative and no distress Resp: clear to auscultation bilaterally Cardio: regular rate and rhythm, S1, S2 normal, no murmur, click, rub or gallop GI: soft, appropriate tenderness, nd, mild bruising in midline but no sign of infection Pulses: 2+ and symmetric Skin: Skin color, texture, turgor normal. No rashes or lesions Neurologic: Alert and oriented X 3, normal strength and tone. Normal symmetric reflexes. Normal coordination and gait  Lab Results:   Basename 06/25/11 0315 06/22/11 1355  WBC 11.2* 6.4  HGB 8.8* 10.4*  HCT 29.1* 33.7*  PLT 208 218   BMET  Basename 06/25/11 0315 06/24/11 1800  NA 137 137  K 4.6 4.1  CL 105 104  CO2 26 25  GLUCOSE 118* 136*  BUN 7 8  CREATININE 0.62 0.62  CALCIUM 7.8* 8.1*   PT/INR No results found for this basename: LABPROT:2,INR:2 in the last 72 hours ABG No results found for this basename: PHART:2,PCO2:2,PO2:2,HCO3:2 in the last 72 hours  Studies/Results: No results found.  Anti-infectives: Anti-infectives     Start     Dose/Rate Route Frequency Ordered Stop   06/25/11 0700   ertapenem (INVANZ) 1 g in sodium chloride 0.9 % 50 mL IVPB        1 g 100 mL/hr over 30 Minutes Intravenous  Once 06/24/11 1156     06/24/11 0615   ertapenem  (INVANZ) 1 g in sodium chloride 0.9 % 50 mL IVPB        1 g 100 mL/hr over 30 Minutes Intravenous 60 min pre-op 06/24/11 0608 06/24/11 0700          Assessment/Plan: s/p Procedure(s): LAPAROSCOPIC RIGHT HEMI COLECTOMY d/c foley Advance diet given elevated cardiac enzymes, will consult cardiology but overall she seems to be doing well.  She looks good and is appropriate for this stage postop  LOS: 1 day    Susan Reed DAVID 06/25/2011

## 2011-06-25 NOTE — Op Note (Signed)
NAMEMarland Kitchen  Susan Reed, Susan Reed NO.:  [id number]  MEDICAL RECORD NO.:  [id number]  LOCATION:  1229                         FACILITY:  [facility]  PHYSICIAN:  Lodema Pilot, MD       DATE OF BIRTH:  [birth year]  DATE OF PROCEDURE:  06/24/2011 DATE OF DISCHARGE:                              OPERATIVE REPORT   PREOPERATIVE DIAGNOSIS:  Colon cancer.  POSTOPERATIVE DIAGNOSIS:  Colon cancer.  PROCEDURE:  Laparoscopic right hemicolectomy with small bowel resection.  SURGEON:  Lodema Pilot, M.D.  ASSISTANT:  Almond Lint, M.D.  ANESTHESIA:  General endotracheal anesthesia with 40 cc of 1% lidocaine with epinephrine and 0.25% Marcaine in a 50:50 mixture.  FLUIDS:  2 L crystalloid and 300 cc of Hextend.  ESTIMATED BLOOD LOSS:  Less than 50 cc.  DRAINS:  None.  SPECIMENS:  Right colon and terminal ileum sent to Pathology for permanent sectioning.  FINDINGS:  No evidence of metastatic disease.  A small uterine fibroid. Ovaries appeared normal.  She had a large cecal mass, which was growing into the 2 loops of small bowel, proximal to the ileocecal valve, which were included and resected a block with the specimen.  There was no evidence of sidewall extension or retroperitoneal involvement.  Right ureter was identified, side-to-side functional end-to-end anastomosis was created.  The mesenteric defect was closed.  INDICATION FOR PROCEDURE:  Susan Reed is an 78 year old female with a history of chronic anemia and she has had some bleeding and weakness and presented to [facility] for evaluation.  CT scan noted a large cecal mass with lymphadenopathy and she was referred to me for further evaluation.  Colonoscopy revealed biopsy-proven poorly differentiated adenocarcinoma, and she was set up for surgical excision.  OPERATIVE DETAILS:  Susan Reed was seen and evaluated in the preoperative area and risks and benefits of the procedure were again discussed in lay terms.   Informed consent was obtained.  She was then taken to the operating room, placed on the table in a supine position with her arms tucked and Foley catheter was placed.  General endotracheal anesthesia was obtained.  Her abdomen was prepped and draped in the standard surgical fashion.  A supraumbilical midline incision was made in the skin and the fascia was sharply divided and peritoneum entered using blunt dissection.  A 12 mm balloon trocar was placed at the umbilicus and a 5 mm left upper quadrant trocar was placed, a 11 mm left lower quadrant trocar was placed, and a 5 mm suprapubic trocar was placed, all under direct visualization, the patient was positioned on the table and terminal ileum was adhered to the right lower quadrant.  The sigmoid colon was also loosely adhered to the right lower quadrant.  These were easily taken down a sharp dissection.  The omentum and small bowel were retracted in the left upper quadrant and the right colon and cecum were elevated.  She was very thin and allowed [place] to visualize the ileocolic vascular pedicle. We were also able to see the duodenum through the mesentery.  I scored the peritoneum overlying the ileocolic pedicle near its takeoff from the SMA to SMV and window was created around the vessels  and ensured that these vessels were not coursing onto any small bowel and I using the EnSeal bipolar cautery.  The vascular pedicle was divided, proximal to some visible abnormal lymphadenopathy near its takeoff.  This was well sealed and there was no evidence of any bleeding and then bluntly dissected through the mesocolon, superficial to the duodenum, which was easily visualized and created a window through the mesocolon just distal with the middle colic branches to the right and left branches of the middle colic vessels.  The colon was very mobile, but I divided the omentum from the transverse colon and took down the hepatic flexure and that was able  to carry just down into the pelvis using sharp dissection and then Bovie electrocautery, and mobilized the cecum and terminal ileum from the right pelvic sidewall, which was easily done with small amount of monopolar cautery.  The ureter was identified and preserved. After the colon was completely mobilized, we will pass the midline.  I laparoscopically placed a 2-0 silk stitch on the proximal transverse colon, where the right branch of the middle colic vessels entered into the transverse colon, this was going to be my distal transection point. I identified what I thought was the terminal ileum and ran this proximal and I was planning on placing a stitch laparoscopic as well to identify my proximal transition point; however, she had 2 loops of ileum, which were adhered to the cecum apparently from the malignancy, so decided to not divide the colon intracorporeally and decided to extend my 12 mm supraumbilical incision and exteriorized the intestine, so I could visualize more clearly if this was small intestine, which needed to be resected or not.  I had ran the small bowel proximally from the ileocecal valve to these attachments to the tumor and it did not appear that I could take these down laparoscopically after I extended the incision, and placed the wound protection retractor.  The colon was exteriorized and it was only approximately 1.5 feet of small bowel, which was adherent to the tumor and so Dr. Donell Beers and I decided that it would be the best oncologically to resect this segment of small bowel as well, and also be safer tab 1 single anastomosis instead of 2 anastomoses for this patient, decided to resect the small bowel en bloc with the tumor.  A window was created through the mesocolon at the area of my previously placed suture and the colon was divided with a GIA-75 stapler.  The similar window was created to the mesentery on normal small bowel and small bowel was divided with  the GIA-75 blue load stapler as well, most of the mesentery, although the right colon mesentery had been divided and ligated; however, I used the [place] device to divide the small bowel mesentery for the affected small bowel. There were some palpable and enlarged lymph nodes within the small bowel mesentery as well, and these were incorporated in the specimen.  A 2-0 silk stick tie was used to ligate the base of this mesentery and the specimen was completely resected and passed off the table and sent to Pathology for permanent section.  I inspected the small bowel, which appeared healthy and the colon appeared healthy, however, I took another 4-inch segment of small bowel, which did not look like it had good blood flow, although the bowel appeared healthy, the vessels coursing to the small bowel in this area were not as extensive and that would be better to take additional 4 inches  for better blood flow to her anastomosis. The small bowel reached without tension to the transverse colon, and small enterotomies were made.  The corner of the staple line was taken off both the colon and the small bowel.  Side-to-side functional end-to- end anastomosis was created with the GIA-75 blue load stapler.  The anastomosis was created on the antimesenteric side of the bowel and the colon and a TA-60 stapler was used to close the common enterotomy. After inspecting the internal staple line for hemostasis.  The TA staple line was oversewn with 3-0 silk Lembert sutures, and crotch stitch was placed on the bowel.  The mesenteric defect was approximated with running 2-0 silk stitch to close the mesenteric defect, anastomosis filled widely open and appeared hemostatic, staples appeared well formed.  The anastomosis was replaced in the abdomen and the abdomen was irrigated with several liters of sterile saline solution, the irrigation returned clear.  We had inspected the liver laparoscopically, there  was no evidence of metastatic disease.  We also palpated in both the right and left lobes of the liver and there was no palpable disease.  We had looked at the uterus laparoscopically and the ovaries.  She had a small uterine fibroid, which appeared benign.  There was no evidence of metastatic disease or side wall extension.  The stomach was suctioned from any air with an OG tube and the tube was removed.  The fascia was then approximated with #1 PDS suture x2, taking care to avoid injury to underlying bowel contents.  The subcutaneous tissue was irrigated with sterile saline solution and the wounds were injected with a total of 40 cc of 1% lidocaine with epinephrine, 0.25% Marcaine in a 50:50 mixture and the skin edges were approximated with 4-0 Monocryl subcuticular suture to all skin incisions.  Skin was washed and dried and benzoin and Steri-Strips were applied.  Sterile dressings were applied.  All sponge, needle, instrument counts were correct in the case.  The patient tolerated the procedure well without apparent complication.          ______________________________ Lodema Pilot, MD     BL/MEDQ  D:  06/24/2011  T:  06/25/2011  Job:  [id number]

## 2011-06-25 NOTE — Progress Notes (Signed)
Pt refused to take her 2200 dose of 25mg  Losartan, pt said she takes 12.5mg  of Losartan at home. MD will be notified. Will continue to monitor pt.

## 2011-06-25 NOTE — Consult Note (Signed)
Admit date: 06/24/2011 Name: Susan Reed DOB:  1924/12/12 MRN:  [id number]  Referring Physician:   Dr. Christoper Allegra  Primary Physician:    Dr. Maryelizabeth Rowan  Reason for Consultation:  PVCs, abnormal cardiac enzymes  IMPRESSIONS:  1. PVCs which were transient occurring in the postoperative phase. 2. Borderline abnormal troponin and minimal elevation of CPK-MB of uncertain significance occurring postoperatively 3. Recent partial colectomy for colon cancer 4. Hypertension controlled 5. Previous hyperlipidemia 6. Hypothyroidism  RECOMMENDATION:  Clinically the patient has no symptoms of cardiac ischemia. Her EKG is completely normal. She had PVCs which occurred last evening that were asymptomatic and occurred postoperatively. It's unclear whether this is a chronic problem that was just detected because she was monitored in the hospital. I don't think this represents an acute problem or acute ischemia or acute myocardial infarction. The significance of very minimal elevation of troponin are unclear in the immediate postoperative phase especially in association with acute anemia. I would be inclined to just observe this for the time being. I will get an echocardiogram to assess for structural heart disease. Also would restart her aspirin and place her on low-dose beta blocker for the time being. She may proceed with normal rehabilitation and recovery following surgery.  HISTORY: This very nice 78 year-old female has a history of mild hypertension as well as mild hyperlipidemia and hypothyroidism. She lives independently in a facility for older individuals and normally walks 1-2 miles per day without cardiac symptoms. She normally denies angina and has no PND, orthopnea, syncope, palpitations, or claudication. She recently became anemic and was diagnosed with a colon cancer and yesterday had a partial colectomy by Dr. Biagio Quint. Last evening while on telemetry she had some trigeminal PVCs. She was  asymptomatic at the time and was not complaining of chest pain or shortness of breath. An EKG was normal except for PVCs and cardiac enzymes were ordered by the physician last evening. She had minimal elevation of troponin which has trended down and also minimal elevation of CPK-MB with an elevation of total CPK which likely represents  muscle injury from the surgery. She does have a history of mild COPD. She currently denies any chest pain or shortness of breath and has not really had much in the way of arrhythmias. There is no history of syncope or palpitations.   Past Medical History  Diagnosis Date  . Anemia   . COPD (chronic obstructive pulmonary disease)   . Hyperlipidemia   . Osteoporosis   . Hypertension   . Glaucoma   . Hypothyroidism   . Blood transfusion     2009  . Colon cancer     colon cancer       Past Surgical History  Procedure Date  . Tonsillectomy   . Right colectomy 2013    Allergies:  is allergic to codeine.   Medications: Prior to Admission medications   Medication Sig Start Date End Date Taking? Authorizing Provider  alendronate (FOSAMAX) 70 MG tablet Take 70 mg by mouth every 7 (seven) days. Take with a full glass of water on an empty stomach. Wednesday     Historical Provider, MD  aspirin 81 MG tablet Take 160 mg by mouth daily with breakfast.     Historical Provider, MD  Calcium Carbonate-Vit D-Min (CALTRATE PLUS PO) Take 1 tablet by mouth daily.     Historical Provider, MD  Cholecalciferol (VITAMIN D-3 PO) Take 1,000 Units by mouth daily.     Historical Provider, MD  cycloSPORINE (RESTASIS) 0.05 % ophthalmic emulsion Place 1 drop into both eyes 2 (two) times daily.     Historical Provider, MD  dorzolamide (TRUSOPT) 2 % ophthalmic solution Place 1 drop into both eyes 2 (two) times daily.     Historical Provider, MD  fish oil-omega-3 fatty acids 1000 MG capsule Take 1 g by mouth daily.     Historical Provider, MD  IRON PO Take 65 mg by mouth daily.     Historical Provider, MD  levothyroxine (SYNTHROID, LEVOTHROID) 25 MCG tablet Take 25 mcg by mouth daily before breakfast.     Historical Provider, MD  losartan (COZAAR) 25 MG tablet Take 25 mg by mouth at bedtime.     Historical Provider, MD  Multiple Vitamin (MULTIVITAMIN) capsule Take 1 capsule by mouth daily.    Historical Provider, MD   Family History: family history includes Cancer in her brother and sister and Heart disease in her father.  There is no history of Colon cancer.  Social History:   reports that she has never smoked. She has never used smokeless tobacco. She reports that she drinks alcohol. She reports that she does not use illicit drugs.   Social History Narrative   Widow.  Originally from [place].  Lives in independent living.    Review of Systems:  Negative except for a history of COPD. She has had some mild sinusitis. She is very mild arthritis at times. She has no history of claudication and does have a previous history of anemia. Other than as noted above the remainder of the review of systems is unremarkable  Physical Exam: Blood pressure 130/49, pulse 85, temperature 98.3 F (36.8 C), temperature source Oral, resp. rate 16, height 5\' 1"  (1.549 m), weight 58.786 kg (129 lb 9.6 oz), SpO2 99.00%.    General appearance: appears stated age and no distress Head: Normocephalic, without obvious abnormality, atraumatic Eyes: EOMI, PERRLA, C&S clear Throat: lips, mucosa, and tongue normal; teeth and gums normal Neck: no adenopathy, no carotid bruit, no JVD, supple, symmetrical, trachea midline and thyroid not enlarged, symmetric, no tenderness/mass/nodules Lungs: clear to auscultation bilaterally Heart: regular rate and rhythm, S1, S2 normal, no murmur, click, rub or gallop Abdomen: Mild tenderness from previous surgery, Extremities: 2+ and symmetrical Pulses: 2+ and symmetric Neurologic: Grossly normal  Labs: Results for orders placed during the hospital  encounter of 06/24/11 (from the past 24 hour(s))  BASIC METABOLIC PANEL     Status: Abnormal   Collection Time   06/24/11  6:00 PM      Component Value Range   Sodium 137  135 - 145 (mEq/L)   Potassium 4.1  3.5 - 5.1 (mEq/L)   Chloride 104  96 - 112 (mEq/L)   CO2 25  19 - 32 (mEq/L)   Glucose, Bld 136 (*) 70 - 99 (mg/dL)   BUN 8  6 - 23 (mg/dL)   Creatinine, Ser 9.56  0.50 - 1.10 (mg/dL)   Calcium 8.1 (*) 8.4 - 10.5 (mg/dL)   GFR calc non Af Amer 81 (*) >90 (mL/min)   GFR calc Af Amer >90  >90 (mL/min)  MAGNESIUM     Status: Normal   Collection Time   06/24/11  6:00 PM      Component Value Range   Magnesium 1.8  1.5 - 2.5 (mg/dL)  PHOSPHORUS     Status: Normal   Collection Time   06/24/11  6:00 PM      Component Value Range   Phosphorus 3.9  2.3 - 4.6 (mg/dL)  CBC     Status: Abnormal   Collection Time   06/25/11  3:15 AM      Component Value Range   WBC 11.2 (*) 4.0 - 10.5 (K/uL)   RBC 3.23 (*) 3.87 - 5.11 (MIL/uL)   Hemoglobin 8.8 (*) 12.0 - 15.0 (g/dL)   HCT 40.9 (*) 81.1 - 46.0 (%)   MCV 90.1  78.0 - 100.0 (fL)   MCH 27.2  26.0 - 34.0 (pg)   MCHC 30.2  30.0 - 36.0 (g/dL)   RDW 91.4  78.2 - 95.6 (%)   Platelets 208  150 - 400 (K/uL)  COMPREHENSIVE METABOLIC PANEL     Status: Abnormal   Collection Time   06/25/11  3:15 AM      Component Value Range   Sodium 137  135 - 145 (mEq/L)   Potassium 4.6  3.5 - 5.1 (mEq/L)   Chloride 105  96 - 112 (mEq/L)   CO2 26  19 - 32 (mEq/L)   Glucose, Bld 118 (*) 70 - 99 (mg/dL)   BUN 7  6 - 23 (mg/dL)   Creatinine, Ser 2.13  0.50 - 1.10 (mg/dL)   Calcium 7.8 (*) 8.4 - 10.5 (mg/dL)   Total Protein 5.9 (*) 6.0 - 8.3 (g/dL)   Albumin 2.6 (*) 3.5 - 5.2 (g/dL)   AST 20  0 - 37 (U/L)   ALT 11  0 - 35 (U/L)   Alkaline Phosphatase 64  39 - 117 (U/L)   Total Bilirubin 0.3  0.3 - 1.2 (mg/dL)   GFR calc non Af Amer 81 (*) >90 (mL/min)   GFR calc Af Amer >90  >90 (mL/min)    Basename 06/25/11 1122 06/25/11 0315 06/24/11 1800  CKTOTAL 308*  260* 183*  CKMB 3.5 4.1* 4.2*  CKMBINDEX -- -- --  TROPONINI 0.56* 0.66* 0.41*    EKG: Normal sinus rhythm, normal EKG serially.  Signed:  Darden Palmer MD [facility]   Cardiology Consultant 06/25/2011, 3:58 PM

## 2011-06-26 LAB — CBC
HCT: 26.9 % — ABNORMAL LOW (ref 36.0–46.0)
HCT: 27.1 % — ABNORMAL LOW (ref 36.0–46.0)
MCH: 27.3 pg (ref 26.0–34.0)
MCHC: 31 g/dL (ref 30.0–36.0)
MCV: 90.6 fL (ref 78.0–100.0)
MCV: 90.6 fL (ref 78.0–100.0)
Platelets: 187 10*3/uL (ref 150–400)
RBC: 2.97 MIL/uL — ABNORMAL LOW (ref 3.87–5.11)
RDW: 15.5 % (ref 11.5–15.5)
RDW: 15.6 % — ABNORMAL HIGH (ref 11.5–15.5)
WBC: 8.1 10*3/uL (ref 4.0–10.5)

## 2011-06-26 LAB — COMPREHENSIVE METABOLIC PANEL
AST: 19 U/L (ref 0–37)
Albumin: 2.4 g/dL — ABNORMAL LOW (ref 3.5–5.2)
Alkaline Phosphatase: 55 U/L (ref 39–117)
BUN: 6 mg/dL (ref 6–23)
Chloride: 105 mEq/L (ref 96–112)
Potassium: 4.2 mEq/L (ref 3.5–5.1)
Total Bilirubin: 0.2 mg/dL — ABNORMAL LOW (ref 0.3–1.2)
Total Protein: 5.4 g/dL — ABNORMAL LOW (ref 6.0–8.3)

## 2011-06-26 LAB — DIFFERENTIAL
Eosinophils Absolute: 0 10*3/uL (ref 0.0–0.7)
Eosinophils Relative: 0 % (ref 0–5)
Lymphocytes Relative: 13 % (ref 12–46)
Lymphs Abs: 1 10*3/uL (ref 0.7–4.0)
Monocytes Absolute: 0.7 10*3/uL (ref 0.1–1.0)

## 2011-06-26 MED ORDER — HYDROMORPHONE HCL 2 MG PO TABS: 1.0000 mg | ORAL_TABLET | ORAL | Status: DC | PRN

## 2011-06-26 MED ORDER — LOSARTAN POTASSIUM 25 MG PO TABS
12.5000 mg | ORAL_TABLET | Freq: Every day | ORAL | Status: DC
  Administered 2011-06-26 – 2011-06-27 (×2): 12.5 mg via ORAL
  Filled 2011-06-26 (×3): qty 0.5

## 2011-06-26 NOTE — Progress Notes (Signed)
2 Days Post-Op  Subjective: She feels well today.  Still no flatus or BM but tolerating liquids.  No further EKG issues. Denies chest pains.  Objective: Vital signs in last 24 hours: Temp:  [97.8 F (36.6 C)-99.6 F (37.6 C)] 99.6 F (37.6 C) (02/02 0400) Pulse Rate:  [84-96] 86  (02/02 0406) Resp:  [1-19] 17  (02/02 0406) BP: (130-142)/(35-68) 137/51 mmHg (02/02 0406) SpO2:  [94 %-99 %] 94 % (02/02 0406) Weight:  [110 lb 3.7 oz (50 kg)] 110 lb 3.7 oz (50 kg) (02/02 0400) Last BM Date:  (prior to admission )  Intake/Output from previous day: 02/01 0701 - 02/02 0700 In: 2190 [P.O.:90; I.V.:2100] Out: 1925 [Urine:1925] Intake/Output this shift:    General appearance: alert, cooperative and no distress Resp: nonlabored Cardio: normal rate, regular rhythm GI: soft, mild right sided tenderness, ND, wounds okay. no peritonitis  Lab Results:   Basename 06/26/11 0030 06/25/11 0315  WBC 8.1 11.2*  HGB 8.1* 8.8*  HCT 26.9* 29.1*  PLT 182 208   BMET  Basename 06/26/11 0030 06/25/11 0315  NA 135 137  K 4.2 4.6  CL 105 105  CO2 27 26  GLUCOSE 106* 118*  BUN 6 7  CREATININE 0.51 0.62  CALCIUM 7.7* 7.8*   PT/INR No results found for this basename: LABPROT:2,INR:2 in the last 72 hours ABG No results found for this basename: PHART:2,PCO2:2,PO2:2,HCO3:2 in the last 72 hours  Studies/Results: No results found.  Anti-infectives: Anti-infectives     Start     Dose/Rate Route Frequency Ordered Stop   06/25/11 0700   ertapenem (INVANZ) 1 g in sodium chloride 0.9 % 50 mL IVPB        1 g 100 mL/hr over 30 Minutes Intravenous  Once 06/24/11 1156 06/25/11 0846   06/24/11 0615   ertapenem (INVANZ) 1 g in sodium chloride 0.9 % 50 mL IVPB        1 g 100 mL/hr over 30 Minutes Intravenous 60 min pre-op 06/24/11 0608 06/24/11 0700          Assessment/Plan: s/p Procedure(s): LAPAROSCOPIC RIGHT HEMI COLECTOMY Advance diet will transfer to telemetry today. PT  LOS: 2 days     Lodema Pilot DAVID 06/26/2011

## 2011-06-26 NOTE — Progress Notes (Signed)
  Echocardiogram 2D Echocardiogram has been performed.  Mercy Moore 06/26/2011, 9:49 AM

## 2011-06-26 NOTE — Progress Notes (Signed)
Subjective:  NO c/o chest pain or SOB.  C/o nosebleeds today.  Feels weak.  Objective:  Vital Signs in the last 24 hours: BP 137/51  Pulse 86  Temp(Src) 99.6 F (37.6 C) (Oral)  Resp 17  Ht 5\' 1"  (1.549 m)  Wt 50 kg (110 lb 3.7 oz)  BMI 20.83 kg/m2  SpO2 94%  Physical Exam: Pleasant WF in NAD Lungs:  Clear to A&P Cardiac:  Regular rhythm, normal S1 and S2, no S3 Extremities:  No edema present  Intake/Output from previous day: 02/01 0701 - 02/02 0700 In: 2190 [P.O.:90; I.V.:2100] Out: 1925 [Urine:1925] Weight change: -8.786 kg (-19 lb 5.9 oz)  Unclear if accurate?  Lab Results: Basic Metabolic Panel:  Basename 06/26/11 0030 06/25/11 0315  NA 135 137  K 4.2 4.6  CL 105 105  CO2 27 26  GLUCOSE 106* 118*  BUN 6 7  CREATININE 0.51 0.62   CBC:  Basename 06/26/11 0030 06/25/11 0315  WBC 8.1 11.2*  NEUTROABS 6.4 --  HGB 8.1* 8.8*  HCT 26.9* 29.1*  MCV 90.6 90.1  PLT 182 208   Cardiac Enzymes:  Basename 06/25/11 1122 06/25/11 0315 06/24/11 1800  CKTOTAL 308* 260* 183*  CKMB 3.5 4.1* 4.2*  CKMBINDEX -- -- --  TROPONINI 0.56* 0.66* 0.41*     Telemetry: Reviewed occasional PVC's  Assessment/Plan:  1. PVC's are probably benign 2. Borderline elevation of Troponin in setting of surgery and anemia probably not of clinical significance  REC:  Continue beta blocker Check ECHO today OK to move to floor. May advance activity Watch anemia and consider transfusion if below 8 Hgb.      Susan Reed.  MD [facility] 06/26/2011, 7:56 AM

## 2011-06-27 NOTE — Progress Notes (Signed)
3 Days Post-Op  Subjective: Still feels well.  Has not needed pain medication.  Tolerating regular diet but small amounts.  She has had 3 BM's  Objective: Vital signs in last 24 hours: Temp:  [98.1 F (36.7 C)-98.6 F (37 C)] 98.1 F (36.7 C) (02/03 0451) Pulse Rate:  [37-96] 84  (02/03 0451) Resp:  [13-22] 18  (02/03 0451) BP: (128-160)/(65-78) 157/69 mmHg (02/03 0451) SpO2:  [91 %-96 %] 95 % (02/03 0451) Last BM Date: 06/26/11  Intake/Output from previous day: 02/02 0701 - 02/03 0700 In: 490 [P.O.:30; I.V.:460] Out: 1 [Stool:1] Intake/Output this shift:    General appearance: alert, cooperative and no distress Resp: nonlabored Cardio: normal rate, regular rhythm GI: soft, nontender on exam, bruising around extraction site but no infection, nondistended  Lab Results:   Basename 06/26/11 1417 06/26/11 0030  WBC 7.7 8.1  HGB 8.4* 8.1*  HCT 27.1* 26.9*  PLT 187 182   BMET  Basename 06/26/11 0030 06/25/11 0315  NA 135 137  K 4.2 4.6  CL 105 105  CO2 27 26  GLUCOSE 106* 118*  BUN 6 7  CREATININE 0.51 0.62  CALCIUM 7.7* 7.8*   PT/INR No results found for this basename: LABPROT:2,INR:2 in the last 72 hours ABG No results found for this basename: PHART:2,PCO2:2,PO2:2,HCO3:2 in the last 72 hours  Studies/Results: No results found.  Anti-infectives: Anti-infectives     Start     Dose/Rate Route Frequency Ordered Stop   06/25/11 0700   ertapenem (INVANZ) 1 g in sodium chloride 0.9 % 50 mL IVPB        1 g 100 mL/hr over 30 Minutes Intravenous  Once 06/24/11 1156 06/25/11 0846   06/24/11 0615   ertapenem (INVANZ) 1 g in sodium chloride 0.9 % 50 mL IVPB        1 g 100 mL/hr over 30 Minutes Intravenous 60 min pre-op 06/24/11 0608 06/24/11 0700          Assessment/Plan: s/p Procedure(s): LAPAROSCOPIC RIGHT HEMI COLECTOMY heplock IV and mobilize.  If she can sustain her nutrition and take enough PO intake today, then she should be okay for discharge  tomorrow. Overall, she looks great and there is no sign of postoperative complication.  LOS: 3 days    Susan Reed DAVID 06/27/2011

## 2011-06-27 NOTE — Progress Notes (Signed)
Subjective:  No c/o chest pain or SOB. Walking in hall and eating well.  C/o feeling sleepy on beta blocker, but unclear if this is also post op Objective:  Vital Signs in the last 24 hours: BP 157/69  Pulse 84  Temp(Src) 98.1 F (36.7 C) (Oral)  Resp 18  Ht 5\' 1"  (1.549 m)  Wt 50 kg (110 lb 3.7 oz)  BMI 20.83 kg/m2  SpO2 95%  Physical Exam: Pleasant WF in NAD Lungs:  Clear to A&P Cardiac:  Regular rhythm, normal S1 and S2, no S3 occasional PVC's heard Extremities:  No edema present  Intake/Output from previous day: 02/02 0701 - 02/03 0700 In: 490 [P.O.:30; I.V.:460] Out: 1 [Stool:1]  Lab Results: Basic Metabolic Panel:  Basename 06/26/11 0030 06/25/11 0315  NA 135 137  K 4.2 4.6  CL 105 105  CO2 27 26  GLUCOSE 106* 118*  BUN 6 7  CREATININE 0.51 0.62   CBC:  Basename 06/26/11 1417 06/26/11 0030  WBC 7.7 8.1  NEUTROABS -- 6.4  HGB 8.4* 8.1*  HCT 27.1* 26.9*  MCV 90.6 90.6  PLT 187 182   Cardiac Enzymes:  Basename 06/25/11 1122 06/25/11 0315 06/24/11 1800  CKTOTAL 308* 260* 183*  CKMB 3.5 4.1* 4.2*  CKMBINDEX -- -- --  TROPONINI 0.56* 0.66* 0.41*   Echo: LVH with mild to moderate MR and mild AI, Normal EF  Telemetry: Reviewed occasional PVC's  Assessment/Plan:  1. PVC's are probably benign 2. Borderline elevation of Troponin in setting of surgery and anemia probably not of clinical significance  REC:  Continue beta blocker.  Restart aspirin 81 mg if OK with surgery.  I will need her to see me 2 weeks post d/c.  Should go home on metoprolol BID.  Susan Reed.  MD [facility] 06/27/2011, 8:34 AM

## 2011-06-28 MED ORDER — HYDROMORPHONE HCL 2 MG PO TABS: 1.0000 mg | ORAL_TABLET | ORAL | Status: AC | PRN

## 2011-06-28 MED ORDER — METOPROLOL TARTRATE 25 MG PO TABS: 25.0000 mg | ORAL_TABLET | Freq: Two times a day (BID) | ORAL | Status: DC

## 2011-06-28 NOTE — Discharge Summary (Signed)
  Physician Discharge Summary  Patient ID: Susan Reed MRN: [id number] DOB/AGE: 27-Dec-1924 78 y.o.  Admit date: 06/24/2011 Discharge date: 06/28/2011  Admission Diagnoses: colon cancer  Discharge Diagnoses: colon cancer Active Problems:  Neoplasm of colon, malignant  PVC (premature ventricular contraction)  Elevated troponin   Discharged Condition: good  Hospital Course: to OR for lap right colectomy 06/24/11.  Had some PVC's and elevated troponin on POD 0 but asymptomatic and felt to be insignificant by cardiology.  Did well and diet advanced POD 1.  Bowel function returned and diet advanced and she was feeling well and ready for discharge on POD 4.  Consults: cardiology  Significant Diagnostic Studies: echo  Treatments: surgery: lap right colectomy 06/24/11  Disposition: Final discharge disposition not confirmed  Discharge Orders    Future Appointments: Provider: Department: Dept Phone: Center:   07/09/2011 10:30 AM Rulon Abide, DO [facility] [phone] None   07/13/2011 1:30 PM [facility] [phone] None   07/13/2011 1:45 PM Jarrett Soho Hickerson [facility] [phone] None   07/13/2011 2:00 PM Lucile Shutters, MD [facility] [phone] None     Medication List  As of 06/28/2011 11:09 AM   ASK your doctor about these medications         alendronate 70 MG tablet   Commonly known as: FOSAMAX   Take 70 mg by mouth every 7 (seven) days. Take with a full glass of water on an empty stomach. Wednesday        aspirin 81 MG tablet   Take 160 mg by mouth daily with breakfast.      CALTRATE PLUS PO   Take 1 tablet by mouth daily.      cycloSPORINE 0.05 % ophthalmic emulsion   Commonly known as: RESTASIS   Place 1 drop into both eyes 2 (two) times daily.      dorzolamide 2 % ophthalmic solution   Commonly known as: TRUSOPT   Place 1 drop into both eyes 2 (two) times daily.      fish oil-omega-3 fatty acids  1000 MG capsule   Take 1 g by mouth daily.      IRON PO   Take 65 mg by mouth daily.      levothyroxine 25 MCG tablet   Commonly known as: SYNTHROID, LEVOTHROID   Take 25 mcg by mouth daily before breakfast.      losartan 25 MG tablet   Commonly known as: COZAAR   Take 25 mg by mouth at bedtime.      multivitamin capsule   Take 1 capsule by mouth daily.      VITAMIN D-3 PO   Take 1,000 Units by mouth daily.           Follow-up Information    Follow up with TILLEY JR,W SPENCER, MD. Schedule an appointment as soon as possible for a visit in 2 weeks.   Contact information:   [address] [place] [address] [phone]          Signed: Lodema Pilot DAVID 06/28/2011, 11:09 AM

## 2011-06-28 NOTE — Progress Notes (Signed)
4 Days Post-Op  Subjective: Doing well.  Tolerating regular diet.  +BM. Pain controlled   Objective: Vital signs in last 24 hours: Temp:  [98.2 F (36.8 C)-98.6 F (37 C)] 98.2 F (36.8 C) (02/04 0438) Pulse Rate:  [71-80] 77  (02/04 0438) Resp:  [19-20] 20  (02/04 0438) BP: (142-156)/(59-75) 152/70 mmHg (02/04 0438) SpO2:  [95 %-97 %] 96 % (02/04 0438) Last BM Date: 06/27/11  Intake/Output from previous day: 02/03 0701 - 02/04 0700 In: 720 [P.O.:720] Out: -  Intake/Output this shift: Total I/O In: 240 [P.O.:240] Out: -   General appearance: alert, cooperative and no distress Resp: nonlabored Cardio: normal rate, regular rhythm GI: soft, so significant tenderness, ND, no infection  Lab Results:   Basename 06/26/11 1417 06/26/11 0030  WBC 7.7 8.1  HGB 8.4* 8.1*  HCT 27.1* 26.9*  PLT 187 182   BMET  Basename 06/26/11 0030  NA 135  K 4.2  CL 105  CO2 27  GLUCOSE 106*  BUN 6  CREATININE 0.51  CALCIUM 7.7*   PT/INR No results found for this basename: LABPROT:2,INR:2 in the last 72 hours ABG No results found for this basename: PHART:2,PCO2:2,PO2:2,HCO3:2 in the last 72 hours  Studies/Results: No results found.  Anti-infectives: Anti-infectives     Start     Dose/Rate Route Frequency Ordered Stop   06/25/11 0700   ertapenem (INVANZ) 1 g in sodium chloride 0.9 % 50 mL IVPB        1 g 100 mL/hr over 30 Minutes Intravenous  Once 06/24/11 1156 06/25/11 0846   06/24/11 0615   ertapenem (INVANZ) 1 g in sodium chloride 0.9 % 50 mL IVPB        1 g 100 mL/hr over 30 Minutes Intravenous 60 min pre-op 06/24/11 0608 06/24/11 0700          Assessment/Plan: s/p Procedure(s): LAPAROSCOPIC RIGHT HEMI COLECTOMY looks good, should be okay for discharge if she feels strong enough.  I have asked PT to evaluate for home safety as well.  If okay with PT, should be ready for discharge.   LOS: 4 days    Lodema Pilot DAVID 06/28/2011 Pager [phone]

## 2011-06-28 NOTE — Progress Notes (Signed)
06/28/11 Patient is eager to be discharged. Patient had the PIV removed. The patient will be discharged with a family member to take her home by private car.

## 2011-06-28 NOTE — Evaluation (Signed)
Physical Therapy Evaluation One time eval and D/C from acute PT Patient Details Name: Susan Reed MRN: [id number] DOB: 09-28-1924 Today's Date: 06/28/2011 Time: 4098-1191 Charge: EVII  Problem List:  Patient Active Problem List  Diagnoses  . COPD (chronic obstructive pulmonary disease)  . Neoplasm of colon, malignant  . PVC (premature ventricular contraction)  . Elevated troponin  . Hypertension  . Hypothyroidism    Past Medical History:  Past Medical History  Diagnosis Date  . Anemia   . COPD (chronic obstructive pulmonary disease)   . Hyperlipidemia   . Osteoporosis   . Hypertension   . Glaucoma   . Hypothyroidism   . Blood transfusion     2009  . Colon cancer     colon cancer    Past Surgical History:  Past Surgical History  Procedure Date  . Tonsillectomy   . Right colectomy 2013    PT Assessment/Plan/Recommendation PT Assessment Clinical Impression Statement: Pt s/p R hemi colectomy and has planned d/c for today.  Pt and daughter educated on bed mobility (log roll technique).  Daughter reports pt will have 24/7 supervision upon d/c as pt is d/cing to daughter's home.  Educated pt to have someone with her for first day or 2 when getting up or ambulating for safety 2* her reporting weakness.  HEP to be given by PT prior to d/c.  Pt and daughter feel with this and moving around house that pt will improve strength so they decline HHPT. PT Recommendation/Assessment: Patent does not need any further PT services No Skilled PT: Patient will have necessary level of assist by caregiver at discharge;All education completed PT Recommendation Follow Up Recommendations: No PT follow up;Supervision for mobility/OOB Equipment Recommended: None recommended by PT PT Goals     PT Evaluation Precautions/Restrictions    Prior Functioning  Home Living Type of Home: Independent living facility Home Adaptive Equipment: None Additional Comments: Pt plans to d/c home to  daughter's house.  One level with no stairs.  Daughter reports pt will have 24/7 supervision.  Daughter's shower is walk in with corner shower seat. Prior Function Level of Independence: Independent with basic ADLs;Independent with gait Cognition Cognition Arousal/Alertness: Awake/alert Overall Cognitive Status: Appears within functional limits for tasks assessed Sensation/Coordination   Extremity Assessment RLE Strength Right Hip Flexion: 3+/5 Right Knee Extension: 4/5 Right Ankle Dorsiflexion: 4/5 LLE Strength Left Hip Flexion: 3+/5 Left Knee Extension: 4/5 Left Ankle Dorsiflexion: 4/5 Mobility (including Balance) Bed Mobility Bed Mobility: Yes Rolling Right: 5: Supervision Rolling Right Details (indicate cue type and reason): verbal cues for technique Right Sidelying to Sit: 4: Min assist Right Sidelying to Sit Details (indicate cue type and reason): assist for trunk, verbal cues for technique, daughter present and educated on assist as she will be helping at home Transfers Transfers: Yes Sit to Stand: 4: Min assist;From bed;With upper extremity assist Sit to Stand Details (indicate cue type and reason): min/guard, bed slightly elevated to height of bed at home Stand to Sit: With upper extremity assist;4: Min assist;To chair/3-in-1 Stand to Sit Details: min/guard Ambulation/Gait Ambulation/Gait: Yes Ambulation/Gait Assistance: 4: Min assist Ambulation/Gait Assistance Details (indicate cue type and reason): min/guard, pt reports feeling slightly unsteady but no LOB observed and pt declined assistive device Ambulation Distance (Feet): 160 Feet Assistive device: None Gait Pattern: Decreased trunk rotation;Step-through pattern Gait velocity: slow    Exercise    End of Session PT - End of Session Activity Tolerance: Patient tolerated treatment well Patient left: in chair;with family/visitor  present General Behavior During Session: [facility] for tasks performed Cognition: [facility]  for tasks performed  Susan Reed,Susan Reed 06/28/2011, 11:44 AM Pager: [phone]

## 2011-07-05 ENCOUNTER — Telehealth (INDEPENDENT_AMBULATORY_CARE_PROVIDER_SITE_OTHER): Payer: Self-pay

## 2011-07-05 NOTE — Telephone Encounter (Signed)
error 

## 2011-07-06 ENCOUNTER — Telehealth (INDEPENDENT_AMBULATORY_CARE_PROVIDER_SITE_OTHER): Payer: Self-pay

## 2011-07-06 NOTE — Telephone Encounter (Signed)
Left message with [place] to call our office to discuss appointment times on 07/07/11, patient scheduled for 07/09/11 they are not able to come in the morning and need to r/s.

## 2011-07-07 ENCOUNTER — Encounter (INDEPENDENT_AMBULATORY_CARE_PROVIDER_SITE_OTHER): Payer: Self-pay | Admitting: General Surgery

## 2011-07-07 ENCOUNTER — Ambulatory Visit (INDEPENDENT_AMBULATORY_CARE_PROVIDER_SITE_OTHER): Payer: Medicare Other | Admitting: General Surgery

## 2011-07-07 VITALS — BP 140/70 | HR 68 | Temp 98.1°F | Resp 18 | Ht 63.0 in | Wt 114.0 lb

## 2011-07-07 DIAGNOSIS — Z4889 Encounter for other specified surgical aftercare: Secondary | ICD-10-CM

## 2011-07-07 DIAGNOSIS — Z5189 Encounter for other specified aftercare: Secondary | ICD-10-CM

## 2011-07-07 DIAGNOSIS — C189 Malignant neoplasm of colon, unspecified: Secondary | ICD-10-CM

## 2011-07-07 NOTE — Progress Notes (Signed)
Subjective:     Patient ID: Susan Reed, female   DOB: 1924-07-20, 78 y.o.   MRN: [id number]  HPI This patient follows up 2 weeks status post laparoscopic right hemicolectomy for colon cancer. She complains of usual postoperative problem such as decreased appetite and lethargy as well as more frequent bowel movements. She states that she feels like she needs to the bathroom after each time she eats and has 3 bowel movements today. Otherwise, she has no fevers or chills. She has not taken any pain medications since her procedure.  Review of Systems     Objective:   Physical Exam No acute distress and nontoxic-appearing  Her abdomen is soft and nontender on exam her incisions are healing well without sign of infection.    Assessment:     Status post laparoscopic lectomy for cancer Think overall she's doing extremely well. She has usual type complaints of decreased energy decreased appetite. I think a lot of her she issues can be attributed to surgery as well as having started and a beta blocker due to frequent PVCs while in the hospital. She also has an appointment with the oncologist next week. I reviewed her pathology with her and she has a T4 N2 tumor.    Plan:     She obviously has a high-grade tumor and advanced tumor with nodal spread and I recommended that she see an oncologist to discuss chemotherapy and adjuvant options. Given her age, I do think it unlikely that she will receive chemotherapy but she has appointment next week with the oncologist. I also recommended that she increase her fiber intake and discussed with her cardiologist possibly changing her beta blocker to an alternative medication and I think this might help with some of her energy issues. I recommended that we get a baseline CEA level now and we will see her back in 6 months and follow her CEA levels.

## 2011-07-09 ENCOUNTER — Encounter (INDEPENDENT_AMBULATORY_CARE_PROVIDER_SITE_OTHER): Payer: Medicare Other | Admitting: General Surgery

## 2011-07-13 ENCOUNTER — Ambulatory Visit (HOSPITAL_BASED_OUTPATIENT_CLINIC_OR_DEPARTMENT_OTHER): Payer: Medicare Other | Admitting: Oncology

## 2011-07-13 ENCOUNTER — Ambulatory Visit: Payer: Medicare Other

## 2011-07-13 ENCOUNTER — Other Ambulatory Visit: Payer: Medicare Other | Admitting: Lab

## 2011-07-13 VITALS — BP 151/70 | HR 78 | Temp 98.5°F | Ht 63.0 in | Wt 115.7 lb

## 2011-07-13 DIAGNOSIS — J449 Chronic obstructive pulmonary disease, unspecified: Secondary | ICD-10-CM

## 2011-07-13 DIAGNOSIS — D649 Anemia, unspecified: Secondary | ICD-10-CM

## 2011-07-13 DIAGNOSIS — C189 Malignant neoplasm of colon, unspecified: Secondary | ICD-10-CM

## 2011-07-13 NOTE — Progress Notes (Signed)
Referring MD: Murlean Caller 78 y.o.  25-Jan-1925    Reason for Referral: New diagnosis of colon cancer     HPI: She developed diarrhea beginning in November 2012. She reports seen Dr. Duanne Guess in early January 2012 and was noted to have right lower quadrant tenderness. She was referred to the emergency room on 05/26/2011 and a CT of the abdomen revealed an unremarkable liver, spleen, pancreas, and adrenal glands. The kidneys were unremarkable. Thickening of the medial wall of the cecum was worrisome for a cecal neoplasm. No gallop structure. Fluid filled nondistended appendix without wall thickening was noted. Slightly enlarged mesenteric lymph nodes were seen. A 4 cm mass in the fundus of the uterus was felt to represent a fibroid versus a carcinoma.  She was per Dr. Arlyce Dice and underwent a colonoscopy on 06/08/2011. This confirmed a mass at the cecum. Diverticulosis was noted in the sigmoid colon. A biopsy confirmed adenocarcinoma.  She was taken to the operating room by Dr. Nicanor Alcon on 06/24/2011. A laparoscopic right hemicolectomy and small bowel resection was performed. There was no evidence of metastatic disease. A small uterine fibroid was noted. A large cecal mass was found growing into 2 loops of small bowel proximal to the ileocecal valve. These areas of small bowel were resected. Palpable lymph nodes within the small bowel mesentery were noted and were incorporated into the specimen. No evidence of metastatic disease to the liver.  The pathology ([id number]) confirmed an invasive poorly differentiated adenocarcinoma measuring 7 cm. Adenocarcinoma extending through the serosa of the large bowel. Lymphovascular invasion was present. Perineural invasion was not identified. 5 of 15 lymph nodes contained metastatic carcinoma. The surgical resection margins were negative  She has recovered from surgery. She reports abdominal "bloating" and anorexia since surgery.  Past Medical  History  Diagnosis Date  . Anemia  06/22/2011  . COPD (chronic obstructive pulmonary disease)   . Hyperlipidemia   . Osteoporosis   . Hypertension   . Glaucoma   . Hypothyroidism   . Blood transfusion-she reports a transfusion while living in [place]      2009  . Colon cancer (T4 N2), adenocarcinoma of the cecum, status post a right colectomy   06/24/2011     colon cancer     Past Surgical History  Procedure Date  . Tonsillectomy   . Right colectomy 2013    Family History  Problem Relation Age of Onset  . Heart disease Father   . Cancer Sister     pt unaware of what kind, but knows it was female reproductive area  . Cancer Brother     lymphoma  . Colon cancer Neg Hx    .  Gastric cancer                                               Sister  .   Breast cancer                                                Niece  Current outpatient prescriptions:alendronate (FOSAMAX) 70 MG tablet, Take 70 mg by mouth every 7 (seven) days. Take with a full glass of water on an empty stomach. Wednesday , Disp: ,  Rfl: ;  aspirin 81 MG tablet, Take 81 mg by mouth daily with breakfast. , Disp: , Rfl: ;  Calcium Carbonate-Vit D-Min (CALTRATE PLUS PO), Take 1 tablet by mouth daily. , Disp: , Rfl: ;  Cholecalciferol (VITAMIN D-3 PO), Take 1,000 Units by mouth daily. , Disp: , Rfl:  cycloSPORINE (RESTASIS) 0.05 % ophthalmic emulsion, Place 1 drop into both eyes 2 (two) times daily. , Disp: , Rfl: ;  dorzolamide (TRUSOPT) 2 % ophthalmic solution, Place 1 drop into both eyes 2 (two) times daily. , Disp: , Rfl: ;  fish oil-omega-3 fatty acids 1000 MG capsule, Take 1 g by mouth daily. , Disp: , Rfl: ;  IRON PO, Take 65 mg by mouth daily., Disp: , Rfl:  levothyroxine (SYNTHROID, LEVOTHROID) 25 MCG tablet, Take 25 mcg by mouth daily before breakfast. , Disp: , Rfl: ;  losartan (COZAAR) 25 MG tablet, Take 12.5 mg by mouth at bedtime. , Disp: , Rfl: ;  metoprolol tartrate (LOPRESSOR) 25 MG tablet, Take 1 tablet (25  mg total) by mouth 2 (two) times daily., Disp: 60 tablet, Rfl: 1;  Multiple Vitamin (MULTIVITAMIN) capsule, Take 1 capsule by mouth daily., Disp: , Rfl:   Allergies:  Allergies  Allergen Reactions  . Codeine Nausea And Vomiting    Social History: She lives alone in [place]. She moved from [place] 10 years ago. She does not use tobacco or alcohol. She was exposed to secondhand smoke from her husband for many years. She received a red cell transfusion in 2009. She denies risk factors for HIV and hepatitis.    History  Alcohol Use  . Yes    occ    History  Smoking status  . Never Smoker   Smokeless tobacco  . Never Used     ROS:   Positives include: 3 pound weight loss prior to surgery, anorexia following surgery, COPD symptoms have improved since moving to the [place], abdominal "bloating" since surgery, nausea and anorexia following surgery, dark stool while taking iron, numbness in both feet following surgery  A complete ROS was otherwise negative.  Physical Exam:  Blood pressure 151/70, pulse 78, temperature 98.5 F (36.9 C), temperature source Oral, height 5\' 3"  (1.6 m), weight 115 lb 11.2 oz (52.481 kg).  HEENT: Oropharynx without visible mass, neck without mass Lungs: Clear bilaterally Cardiac: Regular rate and rhythm Abdomen: Soft and nontender. No hepatomegaly, no mass, healed surgical incisions  Vascular: No leg edema Lymph nodes: No cervical, supraclavicular, axillary, or inguinal nodes Neurologic: Alert and oriented. The motor examination appears grossly intact. Skin: No rash   LAB:  CBC  Lab Results  Component Value Date   WBC 7.7 06/26/2011   HGB 8.4* 06/26/2011   HCT 27.1* 06/26/2011   MCV 90.6 06/26/2011   PLT 187 06/26/2011     CMP      Component Value Date/Time   NA 135 06/26/2011 0030   K 4.2 06/26/2011 0030   CL 105 06/26/2011 0030   CO2 27 06/26/2011 0030   GLUCOSE 106* 06/26/2011 0030   BUN 6 06/26/2011 0030   CREATININE 0.51 06/26/2011  0030   CALCIUM 7.7* 06/26/2011 0030   PROT 5.4* 06/26/2011 0030   ALBUMIN 2.4* 06/26/2011 0030   AST 19 06/26/2011 0030   ALT 12 06/26/2011 0030   ALKPHOS 55 06/26/2011 0030   BILITOT 0.2* 06/26/2011 0030   GFRNONAA 86* 06/26/2011 0030   GFRAA >90 06/26/2011 0030   CEA 2.1 on 06/03/2011 CEA 3.4  on 07/07/2011  Radiology: Chest x-ray 06/22/2011-hyperinflation with slight flattening of the diaphragm. No nodules at .    Assessment/Plan:   1. Stage IIIc (T4 N2) poorly differentiated adenocarcinoma of the cecum, status post a right colectomy on 06/24/2011.  2. Anemia-? Related to GI bleeding.  3. COPD  4. Family history of multiple cancers   Disposition:   She has been diagnosed with stage III colon cancer. I discussed the diagnosis, prognosis, and adjuvant treatment options with the patient and her family. We reviewed the surgical pathology report in detail. She has a high risk of developing recurrent colon cancer over the next several years. I explained the standard of care is to recommend adjuvant systemic chemotherapy in patients with stage III colon cancer. She appears to be a good candidate for adjuvant systemic therapy.  We discussed the disease-free survival benefit associated with 5-fluorouracil (capecitabine) and the small absolute additional benefit associated with oxaliplatin. She is interested in quality of life and favors single agent capecitabine chemotherapy.  I reviewed the potential toxicities associated with capecitabine including the chance for mucositis, diarrhea, skin rash, hyperpigmentation, hematologic toxicity, and the hand/foot syndrome. She will attend chemotherapy teaching class.  She has a significant family history of multiple cancers. We will ask pathology to submit the colon cancer specimen for microsatellite instability testing. We referred her to the cancer Center nutritionist to a prudent diet with her diagnosis of colon cancer. She will continue aspirin  therapy.  The plan is to begin a first cycle of capecitabine on 07/26/2011. She will return for an office visit on 08/11/2011.  Susan Reed 07/13/2011, 5:14 PM

## 2011-07-13 NOTE — Progress Notes (Signed)
Met with pt and family regarding new diagnosis of colon ca.  Patient education material given to them along with phone numbers for contact at [facility].  Encouraged pt to call for assistance or questions when needed.  Referral made to dietician. Spoke with Evalina Field in Pathology requesting MSI testing on path from 06-24-11 per request of Dr. Truett Perna. Tumor blocks to be sent per pathology on 07-14-11.

## 2011-07-14 ENCOUNTER — Telehealth (INDEPENDENT_AMBULATORY_CARE_PROVIDER_SITE_OTHER): Payer: Self-pay

## 2011-07-14 ENCOUNTER — Encounter: Payer: Self-pay | Admitting: Obstetrics & Gynecology

## 2011-07-14 DIAGNOSIS — Z9049 Acquired absence of other specified parts of digestive tract: Secondary | ICD-10-CM

## 2011-07-14 NOTE — Telephone Encounter (Signed)
Patient called stating she's having some bleeding with bowel movements, abdominal cramping and bloating, patient denies dizziness, lightheadedness or nausea/vomiting. Recommended the patient go to the ED, she states she will if her symptoms increase but, will come into our Urgent Office for further evaluation on 07/15/11 w/Dr. Gerrit Friends.  Reviewed with Dr. Johna Sheriff and he suggest a CBC prior to being seen.  Susan Reed will go to [place] for lab work.  Dr. Biagio Quint will be notified as well.

## 2011-07-15 ENCOUNTER — Ambulatory Visit (INDEPENDENT_AMBULATORY_CARE_PROVIDER_SITE_OTHER): Payer: Medicare Other | Admitting: Surgery

## 2011-07-15 ENCOUNTER — Encounter (INDEPENDENT_AMBULATORY_CARE_PROVIDER_SITE_OTHER): Payer: Self-pay | Admitting: Surgery

## 2011-07-15 ENCOUNTER — Other Ambulatory Visit: Payer: Self-pay | Admitting: *Deleted

## 2011-07-15 VITALS — BP 160/80 | HR 72 | Temp 97.8°F | Resp 16 | Ht 61.0 in | Wt 115.2 lb

## 2011-07-15 DIAGNOSIS — K648 Other hemorrhoids: Secondary | ICD-10-CM | POA: Insufficient documentation

## 2011-07-15 LAB — CBC WITH DIFFERENTIAL/PLATELET
MCH: 27.4 pg (ref 26.0–34.0)
MCHC: 31.3 g/dL (ref 30.0–36.0)
MCV: 88.1 fL (ref 78.0–100.0)
Platelets: 235 10*3/uL (ref 150–400)
RDW: 17.9 % — ABNORMAL HIGH (ref 11.5–15.5)

## 2011-07-15 MED ORDER — HYDROCORTISONE ACETATE 25 MG RE SUPP: 25.0000 mg | Freq: Two times a day (BID) | RECTAL | Status: AC

## 2011-07-15 MED ORDER — SIMETHICONE 40 MG/0.6ML PO SUSP: 40.0000 mg | Freq: Four times a day (QID) | ORAL | Status: AC | PRN

## 2011-07-15 NOTE — Progress Notes (Signed)
Visit Diagnoses: 1. Hemorrhoids, internal, with complication     HISTORY: The patient is an 78 year old female who recently underwent laparoscopic colon resection by my partner for adenocarcinoma of the colon. Over the past 2 weeks she has noted bleeding per rectum with her bowel movements. She is having discomfort. She is also complaining of gaseous distention. She presents today for evaluation of internal hemorrhoids.  PERTINENT REVIEW OF SYSTEMS: Bleeding with bowel movements, anal rectal discomfort, gaseous abdominal distention  EXAM: HEENT: normocephalic; pupils equal and reactive; sclerae clear; dentition good; mucous membranes moist NECK:  symmetric on extension; no palpable anterior or posterior cervical lymphadenopathy; no supraclavicular masses; no tenderness CHEST: clear to auscultation bilaterally without rales, rhonchi, or wheezes CARDIAC: regular rate and rhythm without significant murmur; peripheral pulses are full ABDOMEN: Surgical incisions well healed; bowel sounds present; no palpable masses; no significant tenderness RECTAL: benign external anal skin tag in the anterior midline; mild tenderness on digital rectal examination; anoscopy shows grade 1-2 internal hemorrhoids. There is a bleeding hemorrhoid in the right anterior position. Rubber band ligation is performed with minor discomfort. EXT:  non-tender without edema; no deformity NEURO: no gross focal deficits; no sign of tremor   IMPRESSION: #1 recent laparoscopic partial colectomy for adenocarcinoma #2 grade 2 internal hemorrhoids with bleeding  PLAN: Rubber band ligation is performed today. Patient will start on Anusol HC suppositories for one week. Patient will begin stool softeners. She will perform tub soaks. I have also called a prescription for Mylicon for gas.  The patient will return for evaluation by my partner in 2 weeks.  Velora Heckler, MD, FACS General & Endocrine Surgery [facility]

## 2011-07-15 NOTE — Patient Instructions (Signed)
ANORECTAL PROCEDURES: 1.  Tub soaks 2-3 times daily in warm water (may add Epsom salts if desired) 2.  Stool softener for one month (store brand Miralax or Colace) 3.  Avoid toilet paper - use baby wipes or Tucks pads 4.  Increase water intake - 6-8 glasses daily 5.  Apply dry pad to area until drainage stops 

## 2011-07-16 ENCOUNTER — Encounter: Payer: Medicare Other | Admitting: Nutrition

## 2011-07-16 MED ORDER — CAPECITABINE 500 MG PO TABS: 1500.0000 mg | ORAL_TABLET | Freq: Two times a day (BID) | ORAL | Status: AC

## 2011-07-21 ENCOUNTER — Encounter: Payer: Self-pay | Admitting: *Deleted

## 2011-07-21 ENCOUNTER — Other Ambulatory Visit: Payer: Self-pay | Admitting: *Deleted

## 2011-07-21 ENCOUNTER — Ambulatory Visit: Payer: Medicare Other | Admitting: Nutrition

## 2011-07-21 ENCOUNTER — Other Ambulatory Visit: Payer: Medicare Other

## 2011-07-21 ENCOUNTER — Telehealth (INDEPENDENT_AMBULATORY_CARE_PROVIDER_SITE_OTHER): Payer: Self-pay

## 2011-07-21 ENCOUNTER — Other Ambulatory Visit (HOSPITAL_BASED_OUTPATIENT_CLINIC_OR_DEPARTMENT_OTHER): Payer: Medicare Other | Admitting: Lab

## 2011-07-21 DIAGNOSIS — C189 Malignant neoplasm of colon, unspecified: Secondary | ICD-10-CM

## 2011-07-21 LAB — COMPREHENSIVE METABOLIC PANEL
BUN: 14 mg/dL (ref 6–23)
CO2: 28 mEq/L (ref 19–32)
Calcium: 9 mg/dL (ref 8.4–10.5)
Chloride: 103 mEq/L (ref 96–112)
Creatinine, Ser: 0.62 mg/dL (ref 0.50–1.10)

## 2011-07-21 LAB — CBC WITH DIFFERENTIAL/PLATELET
Basophils Absolute: 0 10*3/uL (ref 0.0–0.1)
Eosinophils Absolute: 0.2 10*3/uL (ref 0.0–0.5)
HCT: 33.5 % — ABNORMAL LOW (ref 34.8–46.6)
HGB: 10.8 g/dL — ABNORMAL LOW (ref 11.6–15.9)
MONO#: 0.6 10*3/uL (ref 0.1–0.9)
NEUT%: 58.9 % (ref 38.4–76.8)
WBC: 5.9 10*3/uL (ref 3.9–10.3)
lymph#: 1.6 10*3/uL (ref 0.9–3.3)

## 2011-07-21 MED ORDER — PROCHLORPERAZINE MALEATE 10 MG PO TABS: 10.0000 mg | ORAL_TABLET | Freq: Four times a day (QID) | ORAL | Status: DC | PRN

## 2011-07-21 NOTE — Assessment & Plan Note (Signed)
MEDICAL DIAGNOSIS:  Susan Reed and her daughter present to nutrition consult.  She is an 78 year old female patient of Dr. Kalman Drape diagnosed with stage III colon cancer.  MEDICAL HISTORY INCLUDES: 1. Right hemicolectomy and bowel resection. 2. Anemia. 3. COPD. 4. Hyperlipidemia. 5. Osteoporosis. 6. Hypertension. 7. Hypothyroidism.  MEDICATIONS INCLUDE: 1. Fosamax. 2. Calcium with vitamin D. 3. Omega-3 fatty acids. 4. Synthroid. 5. Multivitamin.  LABS:  Include a glucose of 106 on February 2nd.  HEIGHT:  63 inches WEIGHT:  Weight today was 113.8 pounds. USUAL BODY WEIGHT:  122 pounds BMI:  20.15  PATIENT STATES:  The patient reports that she has moved in with her daughter, who will be helping care for her and cooking for her.  The patient reports pretty excessive diarrhea.  She has had a history of hemorrhoid issues.  She has gas pains with eating a lot of different foods and she has a poor appetite. In general, the patient has always consumed a fairly healthy diet, consuming fruits, vegetables, fish, yogurt, and oatmeal primarily.  She is lactose intolerant but tolerates Lactaid milk and some yogurt and cheese.  Since her surgery, she was told to eat a high-fiber diet.  However, the patient's diarrhea has worsened.  She prefers not to take Metamucil, which was recommended, but she does incorporate small amounts of flaxseed which has some fiber in it.  She has had weight loss of approximately 9 pounds or so over the last 6 weeks.  NUTRITION DIAGNOSIS:  Unintended weight loss related to new diagnosis of colon cancer and associated treatments as evidenced by a 7% weight loss in the last 6 weeks.  INTERVENTION:  I have educated the patient on the importance of small frequent meals with high-protein and high-calorie foods.  We have discussed lactose-free choices.  I have encouraged her to consume more soluble fiber in her diet to help with her diarrhea.  I have recommended that for now  she eliminate skins on fruit, nuts, and seeds.  It is fine for her to continue flaxseed in small amounts.  I have recommended gradually adding fiber back in as she tolerates it and, of course, based on her bowel movements.  She was educated to avoid gas forming fruits and vegetables. I encouraged her to increase her clear liquid nutritional supplements to b.i.d. between meals.  I provided fact sheets and coupons for the patient along with my contact information.  MONITORING/EVALUATION (GOALS):  The patient will tolerate increased oral intake to minimize any further weight loss and to minimize symptoms.  NEXT VISIT:  I have not scheduled followup with the patient.  However,they agreed to call me if have any questions or concerns.    ______________________________ Susan Reed, RD, LDN Clinical Nutrition Specialist BN/MEDQ  D:  07/21/2011  T:  07/21/2011  Job:  [id number]

## 2011-07-21 NOTE — Telephone Encounter (Signed)
Return call:  Left message for patient to call our office

## 2011-08-02 ENCOUNTER — Encounter (INDEPENDENT_AMBULATORY_CARE_PROVIDER_SITE_OTHER): Payer: Self-pay | Admitting: General Surgery

## 2011-08-02 ENCOUNTER — Ambulatory Visit (INDEPENDENT_AMBULATORY_CARE_PROVIDER_SITE_OTHER): Payer: Medicare Other | Admitting: General Surgery

## 2011-08-02 VITALS — BP 156/70 | HR 80 | Temp 97.5°F | Resp 16 | Ht 61.0 in | Wt 114.8 lb

## 2011-08-02 DIAGNOSIS — Z85038 Personal history of other malignant neoplasm of large intestine: Secondary | ICD-10-CM

## 2011-08-02 DIAGNOSIS — Z4889 Encounter for other specified surgical aftercare: Secondary | ICD-10-CM

## 2011-08-02 DIAGNOSIS — Z5189 Encounter for other specified aftercare: Secondary | ICD-10-CM

## 2011-08-02 NOTE — Progress Notes (Signed)
Subjective:     Patient ID: Susan Reed, female   DOB: May 06, 1925, 78 y.o.   MRN: [id number]  HPI Susan Reed follows up 2 weeks status post banding of hemorrhoids for bleeding. She has been recovering pretty well from her surgery and has started her chemotherapy. She moves her bowels approximately 3 times per day and has bleeding approximately once per day. Prior to her surgery she had 2 bowel movements per day. She is taking some fiber supplements and has been improving her appetite has been improving and she has not lost any weight although she still has a hard time maintaining her caloric intake.  Review of Systems     Objective:   Physical Exam Her abdomen is soft and nontender exam her incisions are healing well without sign of infection. Rectal exam showed some external skin and some small external hemorrhoids. Digital rectal exam revealed no evidence of bleeding and no internal masses.    Assessment:     Status post laparoscopic right hemicolectomy for colon cancer Bleeding hemorrhoids She seems to be doing pretty well from her surgery she does have more frequent bowel movements which is not unexpected after her partial colectomy. She has loose bowels prior to her surgery and in she is still having some occasional discomfort from her hemorrhoids with a small amount of daily bleeding.    Plan:     I recommended continue conservative management of the hemorrhoids and symptomatic treatment.  F/u with me in 6 months with repeat CEA at that time.

## 2011-08-11 ENCOUNTER — Other Ambulatory Visit (HOSPITAL_BASED_OUTPATIENT_CLINIC_OR_DEPARTMENT_OTHER): Payer: Medicare Other | Admitting: Lab

## 2011-08-11 ENCOUNTER — Ambulatory Visit (HOSPITAL_BASED_OUTPATIENT_CLINIC_OR_DEPARTMENT_OTHER): Payer: Medicare Other | Admitting: Oncology

## 2011-08-11 ENCOUNTER — Telehealth: Payer: Self-pay | Admitting: Certified Registered Nurse Anesthetist

## 2011-08-11 ENCOUNTER — Telehealth: Payer: Self-pay | Admitting: Oncology

## 2011-08-11 VITALS — BP 133/62 | HR 68 | Temp 97.1°F | Ht 61.0 in | Wt 114.1 lb

## 2011-08-11 DIAGNOSIS — C189 Malignant neoplasm of colon, unspecified: Secondary | ICD-10-CM

## 2011-08-11 DIAGNOSIS — D649 Anemia, unspecified: Secondary | ICD-10-CM

## 2011-08-11 DIAGNOSIS — C180 Malignant neoplasm of cecum: Secondary | ICD-10-CM

## 2011-08-11 DIAGNOSIS — J4489 Other specified chronic obstructive pulmonary disease: Secondary | ICD-10-CM

## 2011-08-11 DIAGNOSIS — Z809 Family history of malignant neoplasm, unspecified: Secondary | ICD-10-CM

## 2011-08-11 DIAGNOSIS — J449 Chronic obstructive pulmonary disease, unspecified: Secondary | ICD-10-CM

## 2011-08-11 LAB — CBC WITH DIFFERENTIAL/PLATELET
BASO%: 0.6 % (ref 0.0–2.0)
Basophils Absolute: 0 10*3/uL (ref 0.0–0.1)
HCT: 33.1 % — ABNORMAL LOW (ref 34.8–46.6)
HGB: 11 g/dL — ABNORMAL LOW (ref 11.6–15.9)
MCHC: 33.1 g/dL (ref 31.5–36.0)
MONO#: 0.6 10*3/uL (ref 0.1–0.9)
NEUT%: 51.6 % (ref 38.4–76.8)
RDW: 18.8 % — ABNORMAL HIGH (ref 11.2–14.5)
WBC: 5.2 10*3/uL (ref 3.9–10.3)
lymph#: 1.7 10*3/uL (ref 0.9–3.3)

## 2011-08-11 NOTE — Progress Notes (Signed)
Met with patient and daughter to assess for any needs or assistance.  Patient states she would like help from "meals on wheels".  Referral made to SW who will be in touch with pt.  Pt was also given information on upcoming cancer support group. Will continue to follow.

## 2011-08-11 NOTE — Telephone Encounter (Signed)
appt made and printed for pt aom °

## 2011-08-11 NOTE — Telephone Encounter (Signed)
Pharmacy Care Plan received from [facility].  Record sent to be scanned. HL

## 2011-08-12 NOTE — Progress Notes (Signed)
OFFICE PROGRESS NOTE   INTERVAL HISTORY:   She returns as scheduled. She completed 1 cycle of Xeloda chemotherapy. She has several loose bowel movements per day. This has increased slightly since beginning the chemotherapy. She has soreness surrounding a left lower tooth. No hand or foot pain.  Objective:  Vital signs in last 24 hours:  Blood pressure 133/62, pulse 68, temperature 97.1 F (36.2 C), temperature source Oral, height 5\' 1"  (1.549 m), weight 114 lb 1.6 oz (51.755 kg).    HEENT: Small ulceration at the gum line and a left premolar. No other ulcers, no thrush Resp: Lungs clear bilaterally Cardio: Regular rate and rhythm GI: No hepatomegaly, nontender Vascular: No leg edema  Skin: Palms without erythema    Lab Results:  Lab Results  Component Value Date   WBC 5.2 08/11/2011   HGB 11.0* 08/11/2011   HCT 33.1* 08/11/2011   MCV 89.1 08/11/2011   PLT 254 08/11/2011   ANC 3.5    Medications: I have reviewed the patient's current medications.  Assessment/Plan: 1.Stage IIIc (T4 N2) poorly differentiated adenocarcinoma of the cecum, status post a right colectomy on 06/24/2011.  -Status post cycle 1 of adjuvant Xeloda 07/26/2011 2. Anemia-? Related to GI bleeding.   3. COPD   4. Family history of multiple cancers       Disposition:  She is computed 1 cycle of adjuvant Xeloda chemotherapy. She appears to have tolerated chemotherapy well. She will begin cycle 2 on 08/16/2011. She will return for an office visit on 09/01/2011   Lucile Shutters, MD  08/12/2011  8:16 AM

## 2011-08-13 ENCOUNTER — Encounter: Payer: Self-pay | Admitting: *Deleted

## 2011-08-13 NOTE — Progress Notes (Addendum)
ON 08/11/11 RECEIVED A FAX FROM [facility] CONCERNING A CONFIRMATION OF PRESCRIPTION SHIPMENT FOR XELODA. 

## 2011-08-17 ENCOUNTER — Encounter: Payer: Self-pay | Admitting: *Deleted

## 2011-08-17 NOTE — Progress Notes (Signed)
[facility] Brief Psychosocial Assessment Clinical Social Work  Clinical Social Work was referred by patient navigator for assessment of psychosocial needs.  Clinical Social Worker contacted patient at home to offer support and assess for needs.  Pt had requested information on food resources and assistance with food/meal preparation.  CSW had provided pt with information on the [employer] and their food assistance program at her last appointment .  CSW call pt to follow up and offer any assistance.  Pt stated she was currently staying with her daughter and planned to stay for a few more weeks, but planned to call [employer] when she is living on her own.  CSW encouraged pt to go ahead and call [employer], as there may be a waiting list or other programs she could benefit from.  Pt verbalized understanding and stated she would call when her daughter was home and able to assist her.  CSW encouraged pt to call if she has any difficulty or need further information.    Tamala Julian, MSW, LCSW Clinical Social Worker [facility] [phone]

## 2011-08-31 ENCOUNTER — Encounter: Payer: Self-pay | Admitting: *Deleted

## 2011-08-31 ENCOUNTER — Other Ambulatory Visit: Payer: Self-pay | Admitting: *Deleted

## 2011-08-31 DIAGNOSIS — C189 Malignant neoplasm of colon, unspecified: Secondary | ICD-10-CM

## 2011-08-31 MED ORDER — CAPECITABINE 500 MG PO TABS: 1500.0000 mg | ORAL_TABLET | Freq: Two times a day (BID) | ORAL | Status: DC

## 2011-08-31 NOTE — Progress Notes (Signed)
RECEIVED A FAX FROM [facility] CONCERNING A CONFIRMATION OF FACSIMILE RECEIPT. 

## 2011-09-01 ENCOUNTER — Ambulatory Visit (HOSPITAL_BASED_OUTPATIENT_CLINIC_OR_DEPARTMENT_OTHER): Payer: Medicare Other | Admitting: Nurse Practitioner

## 2011-09-01 ENCOUNTER — Telehealth: Payer: Self-pay | Admitting: Oncology

## 2011-09-01 ENCOUNTER — Other Ambulatory Visit (HOSPITAL_BASED_OUTPATIENT_CLINIC_OR_DEPARTMENT_OTHER): Payer: Medicare Other | Admitting: Lab

## 2011-09-01 VITALS — BP 127/72 | HR 75 | Temp 97.7°F | Ht 61.0 in | Wt 112.0 lb

## 2011-09-01 DIAGNOSIS — C180 Malignant neoplasm of cecum: Secondary | ICD-10-CM

## 2011-09-01 DIAGNOSIS — R197 Diarrhea, unspecified: Secondary | ICD-10-CM

## 2011-09-01 DIAGNOSIS — C189 Malignant neoplasm of colon, unspecified: Secondary | ICD-10-CM

## 2011-09-01 DIAGNOSIS — D649 Anemia, unspecified: Secondary | ICD-10-CM

## 2011-09-01 DIAGNOSIS — R110 Nausea: Secondary | ICD-10-CM

## 2011-09-01 LAB — CBC WITH DIFFERENTIAL/PLATELET
Basophils Absolute: 0 10*3/uL (ref 0.0–0.1)
EOS%: 0.6 % (ref 0.0–7.0)
Eosinophils Absolute: 0.1 10*3/uL (ref 0.0–0.5)
HCT: 32 % — ABNORMAL LOW (ref 34.8–46.6)
HGB: 10.6 g/dL — ABNORMAL LOW (ref 11.6–15.9)
MCH: 31.6 pg (ref 25.1–34.0)
MONO#: 1 10*3/uL — ABNORMAL HIGH (ref 0.1–0.9)
NEUT#: 7.9 10*3/uL — ABNORMAL HIGH (ref 1.5–6.5)
NEUT%: 74.9 % (ref 38.4–76.8)
lymph#: 1.6 10*3/uL (ref 0.9–3.3)

## 2011-09-01 MED ORDER — CAPECITABINE 500 MG PO TABS: 1500.0000 mg | ORAL_TABLET | Freq: Two times a day (BID) | ORAL | Status: DC

## 2011-09-01 NOTE — Progress Notes (Signed)
OFFICE PROGRESS NOTE  Interval history:  Susan Reed returns as scheduled. She completed cycle 2 Xeloda beginning 08/16/2011. She denies mouth sores. She is having intermittent nausea. Compazine is effective for the nausea. She is having periodic loose stools. She takes Imodium as needed. Appetite is poor. She is interested in an appetite stimulant.   Objective: Blood pressure 127/72, pulse 75, temperature 97.7 F (36.5 C), temperature source Oral, height 5\' 1"  (1.549 m), weight 112 lb (50.803 kg).  Oropharynx is without thrush or ulceration. Small ecchymosis at the right buccal mucosa. Lungs are clear. Regular cardiac rhythm. Abdomen is soft and nontender. No hepatomegaly. Extremities are without edema. Calves are soft and nontender. Palms and soles are without erythema.  Lab Results: Lab Results  Component Value Date   WBC 10.6* 09/01/2011   HGB 10.6* 09/01/2011   HCT 32.0* 09/01/2011   MCV 95.7 09/01/2011   PLT 145 09/01/2011    Chemistry:    Chemistry      Component Value Date/Time   NA 142 07/21/2011 0932   K 3.7 07/21/2011 0932   CL 103 07/21/2011 0932   CO2 28 07/21/2011 0932   BUN 14 07/21/2011 0932   CREATININE 0.62 07/21/2011 0932      Component Value Date/Time   CALCIUM 9.0 07/21/2011 0932   ALKPHOS 70 07/21/2011 0932   AST 16 07/21/2011 0932   ALT 16 07/21/2011 0932   BILITOT 0.3 07/21/2011 0932       Studies/Results: No results found.  Medications: I have reviewed the patient's current medications.  Assessment/Plan:  1. Stage IIIc (T4 N2) disease poorly differentiated adenocarcinoma the cecum status post right colectomy on 06/24/2011. She completed cycle 1 adjuvant Xeloda beginning 07/26/2011. She completed cycle 2 beginning 08/16/2011. 2. Anemia. Question if related to GI bleeding. Hemoglobin is stable. 3. Intermittent nausea. Question etiology. She will continue Compazine as needed. 4. Anorexia/weight loss. She will begin Megace 40 mg per cc 5 cc twice  daily. 5. COPD. 6. Family history multiple cancers.  Disposition-[name] has completed 2 cycles of adjuvant Xeloda chemotherapy. She will begin cycle 3 as scheduled on 09/06/2011. She will begin Megace as outlined above. She will return for a followup visit on 09/21/2011. She will contact the office in the interim with any problems.  Plan reviewed with Dr. Truett Perna.  Susan Reed ANP/GNP-BC

## 2011-09-01 NOTE — Telephone Encounter (Signed)
Gv pt appt for april2013 °

## 2011-09-02 ENCOUNTER — Encounter: Payer: Self-pay | Admitting: *Deleted

## 2011-09-02 LAB — COMPREHENSIVE METABOLIC PANEL
Albumin: 3.5 g/dL (ref 3.5–5.2)
BUN: 13 mg/dL (ref 6–23)
CO2: 21 mEq/L (ref 19–32)
Calcium: 8.9 mg/dL (ref 8.4–10.5)
Chloride: 103 mEq/L (ref 96–112)
Glucose, Bld: 167 mg/dL — ABNORMAL HIGH (ref 70–99)
Potassium: 3.6 mEq/L (ref 3.5–5.3)
Sodium: 134 mEq/L — ABNORMAL LOW (ref 135–145)
Total Protein: 5.9 g/dL — ABNORMAL LOW (ref 6.0–8.3)

## 2011-09-02 NOTE — Progress Notes (Signed)
RECEIVED A FAX FROM [facility] CONCERNING A CONFIRMATION OF PRESCRIPTION SHIPMENT FOR XELODA. 

## 2011-09-21 ENCOUNTER — Other Ambulatory Visit (HOSPITAL_BASED_OUTPATIENT_CLINIC_OR_DEPARTMENT_OTHER): Payer: Medicare Other | Admitting: Lab

## 2011-09-21 ENCOUNTER — Telehealth: Payer: Self-pay | Admitting: Oncology

## 2011-09-21 ENCOUNTER — Ambulatory Visit (HOSPITAL_BASED_OUTPATIENT_CLINIC_OR_DEPARTMENT_OTHER): Payer: Medicare Other | Admitting: Oncology

## 2011-09-21 ENCOUNTER — Other Ambulatory Visit: Payer: Self-pay | Admitting: *Deleted

## 2011-09-21 VITALS — BP 161/68 | HR 69 | Temp 97.7°F | Ht 61.0 in | Wt 110.7 lb

## 2011-09-21 DIAGNOSIS — C189 Malignant neoplasm of colon, unspecified: Secondary | ICD-10-CM

## 2011-09-21 DIAGNOSIS — C180 Malignant neoplasm of cecum: Secondary | ICD-10-CM

## 2011-09-21 DIAGNOSIS — R630 Anorexia: Secondary | ICD-10-CM

## 2011-09-21 DIAGNOSIS — J449 Chronic obstructive pulmonary disease, unspecified: Secondary | ICD-10-CM

## 2011-09-21 DIAGNOSIS — T451X5A Adverse effect of antineoplastic and immunosuppressive drugs, initial encounter: Secondary | ICD-10-CM

## 2011-09-21 LAB — CBC WITH DIFFERENTIAL/PLATELET
Basophils Absolute: 0 10*3/uL (ref 0.0–0.1)
Eosinophils Absolute: 0.1 10*3/uL (ref 0.0–0.5)
HCT: 32.8 % — ABNORMAL LOW (ref 34.8–46.6)
HGB: 10.9 g/dL — ABNORMAL LOW (ref 11.6–15.9)
MCH: 33.4 pg (ref 25.1–34.0)
MONO#: 0.6 10*3/uL (ref 0.1–0.9)
NEUT#: 4 10*3/uL (ref 1.5–6.5)
NEUT%: 59.1 % (ref 38.4–76.8)
RDW: 27.1 % — ABNORMAL HIGH (ref 11.2–14.5)
lymph#: 2.1 10*3/uL (ref 0.9–3.3)

## 2011-09-21 LAB — COMPREHENSIVE METABOLIC PANEL
Albumin: 3.9 g/dL (ref 3.5–5.2)
Alkaline Phosphatase: 51 U/L (ref 39–117)
BUN: 15 mg/dL (ref 6–23)
CO2: 24 mEq/L (ref 19–32)
Calcium: 9.3 mg/dL (ref 8.4–10.5)
Chloride: 107 mEq/L (ref 96–112)
Glucose, Bld: 122 mg/dL — ABNORMAL HIGH (ref 70–99)
Potassium: 3.6 mEq/L (ref 3.5–5.3)
Sodium: 138 mEq/L (ref 135–145)
Total Protein: 6 g/dL (ref 6.0–8.3)

## 2011-09-21 NOTE — Progress Notes (Signed)
Met with patient to check in with her and assess for any needs.  She states things are uneventful and she has no complaints at this time.  Denies any help and is very appreciative of the care she is receiving.  Encouraged patient to call if she needs any assistance with her care.  Will continue to follow.

## 2011-09-21 NOTE — Progress Notes (Signed)
OFFICE PROGRESS NOTE   INTERVAL HISTORY:   She returns as scheduled. She completed another cycle of Xeloda on 09/19/2011. She has a formed bowel movement in the morning and 2 loose stools later in the day. This pattern has not changed recently. No mouth sores or hand/foot pain. The appetite has improved since she started Megace.  Objective:  Vital signs in last 24 hours:  Blood pressure 161/68, pulse 69, temperature 97.7 F (36.5 C), temperature source Oral, height 5\' 1"  (1.549 m), weight 110 lb 11.2 oz (50.213 kg).    HEENT: No thrush or ulcers Resp: Lungs clear bilaterally Cardio: Regular rate and rhythm GI: No hepatomegaly, nontender Vascular: No leg edema  Skin: Mild hyperpigmentation at the palms, no skin breakdown   Lab Results:  Lab Results  Component Value Date   WBC 6.8 09/21/2011   HGB 10.9* 09/21/2011   HCT 32.8* 09/21/2011   MCV 100.6 09/21/2011   PLT 183 09/21/2011   ANC 4.0    Medications: I have reviewed the patient's current medications.  Assessment/Plan: 1.Stage IIIc (T4 N2) poorly differentiated adenocarcinoma of the cecum, status post a right colectomy on 06/24/2011.  -Status post cycle 1 of adjuvant Xeloda 07/26/2011 . She completed cycle 3 beginning on 09/06/2011 to 2. Anemia-? Related to GI bleeding at presentation and now due to chemotherapy. Stable. She will discontinue iron therapy since there is no clear evidence of iron deficiency. 3. COPD  4. Family history of multiple cancers  5. Anorexia/weight loss-her weight is down 1 pound today, but she reports a significant improvement in her appetite since starting Megace   Disposition:  She appears stable. She has completed 3 cycles of adjuvant Xeloda chemotherapy. She will begin cycle 4 on 09/27/2011. She will return for an office visit on 10/14/2011.   Thornton Papas, MD  09/21/2011  10:37 AM

## 2011-09-21 NOTE — Telephone Encounter (Signed)
gv pt appt schedule for may.  °

## 2011-09-21 NOTE — Telephone Encounter (Signed)
Called patient and made her aware that she still has refill on her Xeloda. Call [facility] and give her name and DOB and they will ship medication. Confirmed her bottle had #1 refill on it and confirmed the phone # of [facility] with her.

## 2011-09-23 ENCOUNTER — Telehealth: Payer: Self-pay | Admitting: *Deleted

## 2011-09-23 NOTE — Telephone Encounter (Signed)
Received fax from [facility] confirmaing shipment on 09/22/11 for delivery next business day.

## 2011-10-12 ENCOUNTER — Other Ambulatory Visit: Payer: Self-pay | Admitting: *Deleted

## 2011-10-12 ENCOUNTER — Encounter: Payer: Self-pay | Admitting: *Deleted

## 2011-10-12 DIAGNOSIS — C189 Malignant neoplasm of colon, unspecified: Secondary | ICD-10-CM

## 2011-10-12 MED ORDER — CAPECITABINE 500 MG PO TABS: 1500.0000 mg | ORAL_TABLET | Freq: Two times a day (BID) | ORAL | Status: DC

## 2011-10-12 NOTE — Progress Notes (Signed)
RECEIVED A FAX FROM [facility] CONCERNING A CONFIRMATION OF FACSIMILE RECEIPT. 

## 2011-10-12 NOTE — Progress Notes (Signed)
Script for Xeloda refill to Dr Kalman Drape desk for [employer]. dph

## 2011-10-14 ENCOUNTER — Ambulatory Visit (HOSPITAL_BASED_OUTPATIENT_CLINIC_OR_DEPARTMENT_OTHER): Payer: Medicare Other | Admitting: Nurse Practitioner

## 2011-10-14 ENCOUNTER — Other Ambulatory Visit (HOSPITAL_BASED_OUTPATIENT_CLINIC_OR_DEPARTMENT_OTHER): Payer: Medicare Other | Admitting: Lab

## 2011-10-14 ENCOUNTER — Telehealth: Payer: Self-pay | Admitting: Oncology

## 2011-10-14 VITALS — BP 144/68 | HR 68 | Temp 96.8°F | Ht 61.0 in | Wt 114.2 lb

## 2011-10-14 DIAGNOSIS — C189 Malignant neoplasm of colon, unspecified: Secondary | ICD-10-CM

## 2011-10-14 DIAGNOSIS — J449 Chronic obstructive pulmonary disease, unspecified: Secondary | ICD-10-CM

## 2011-10-14 DIAGNOSIS — R630 Anorexia: Secondary | ICD-10-CM

## 2011-10-14 DIAGNOSIS — D649 Anemia, unspecified: Secondary | ICD-10-CM

## 2011-10-14 LAB — CBC WITH DIFFERENTIAL/PLATELET
EOS%: 0.9 % (ref 0.0–7.0)
Eosinophils Absolute: 0.1 10*3/uL (ref 0.0–0.5)
HGB: 11.2 g/dL — ABNORMAL LOW (ref 11.6–15.9)
MCV: 109.2 fL — ABNORMAL HIGH (ref 79.5–101.0)
MONO%: 10.1 % (ref 0.0–14.0)
NEUT#: 6 10*3/uL (ref 1.5–6.5)
RBC: 3.03 10*6/uL — ABNORMAL LOW (ref 3.70–5.45)
RDW: 27.2 % — ABNORMAL HIGH (ref 11.2–14.5)
lymph#: 2.4 10*3/uL (ref 0.9–3.3)
nRBC: 0 % (ref 0–0)

## 2011-10-14 NOTE — Telephone Encounter (Signed)
Gv pt appt for june2013 

## 2011-10-14 NOTE — Progress Notes (Signed)
OFFICE PROGRESS NOTE  Interval history:  Ms. Perin returns as scheduled. She completed cycle 4 adjuvant Xeloda chemotherapy beginning 09/27/2011. She denies nausea/vomiting. No mouth sores. She continues to have 2-3 loose stools per day. She takes Imodium periodically. She denies hand or foot pain or redness. She intermittently notes her feet feel numb in the mornings.    Objective: Blood pressure 144/68, pulse 68, temperature 96.8 F (36 C), temperature source Oral, height 5\' 1"  (1.549 m), weight 114 lb 3.2 oz (51.801 kg).  Oropharynx is without thrush or ulceration. Lungs are clear. Regular cardiac rhythm. Abdomen is soft and nontender. No organomegaly. Extremities are without edema. Palms with mild hyperpigmentation. No skin breakdown. The skin over the soles of the feet has a thickened appearance.  Lab Results: Lab Results  Component Value Date   WBC 9.5 10/14/2011   HGB 11.2* 10/14/2011   HCT 33.1* 10/14/2011   MCV 109.2* 10/14/2011   PLT 237 10/14/2011    Chemistry:    Chemistry      Component Value Date/Time   NA 138 09/21/2011 0949   K 3.6 09/21/2011 0949   CL 107 09/21/2011 0949   CO2 24 09/21/2011 0949   BUN 15 09/21/2011 0949   CREATININE 0.64 09/21/2011 0949      Component Value Date/Time   CALCIUM 9.3 09/21/2011 0949   ALKPHOS 51 09/21/2011 0949   AST 13 09/21/2011 0949   ALT 12 09/21/2011 0949   BILITOT 0.8 09/21/2011 0949       Studies/Results: No results found.  Medications: I have reviewed the patient's current medications.  Assessment/Plan:  1. Stage IIIc (T4 N2) poorly differentiated adenocarcinoma of the cecum status post right colectomy 06/24/2011. She began adjuvant Xeloda chemotherapy on 07/26/2011. 2. Anemia. Question if related to GI bleeding at presentation and now due to chemotherapy. Hemoglobin is stable to slightly improved. 3. COPD. 4. Family history multiple cancers. 5. Anorexia/weight loss. She continues Megace. She has a good appetite and is  gaining weight.  Disposition-Ms. Heitmeyer appears stable. She has completed 4 cycles of adjuvant Xeloda chemotherapy. She will begin cycle 5 on 10/18/2011. She will return for an office visit on 11/04/2011. She will contact the office in the interim with any problems.  Plan reviewed with Dr. Truett Perna.  Lonna Cobb ANP/GNP-BC

## 2011-10-15 ENCOUNTER — Encounter: Payer: Self-pay | Admitting: *Deleted

## 2011-10-15 NOTE — Progress Notes (Signed)
Fax confirmation per [facility] that Xeloda shipped on 10/14/11

## 2011-11-04 ENCOUNTER — Ambulatory Visit (HOSPITAL_BASED_OUTPATIENT_CLINIC_OR_DEPARTMENT_OTHER): Payer: Medicare Other | Admitting: Oncology

## 2011-11-04 ENCOUNTER — Telehealth: Payer: Self-pay | Admitting: Oncology

## 2011-11-04 ENCOUNTER — Other Ambulatory Visit (HOSPITAL_BASED_OUTPATIENT_CLINIC_OR_DEPARTMENT_OTHER): Payer: Medicare Other | Admitting: Lab

## 2011-11-04 VITALS — BP 147/68 | HR 75 | Temp 97.8°F | Ht 61.0 in | Wt 114.9 lb

## 2011-11-04 DIAGNOSIS — C189 Malignant neoplasm of colon, unspecified: Secondary | ICD-10-CM

## 2011-11-04 DIAGNOSIS — J449 Chronic obstructive pulmonary disease, unspecified: Secondary | ICD-10-CM

## 2011-11-04 DIAGNOSIS — J4489 Other specified chronic obstructive pulmonary disease: Secondary | ICD-10-CM

## 2011-11-04 DIAGNOSIS — C180 Malignant neoplasm of cecum: Secondary | ICD-10-CM

## 2011-11-04 DIAGNOSIS — D649 Anemia, unspecified: Secondary | ICD-10-CM

## 2011-11-04 LAB — COMPREHENSIVE METABOLIC PANEL
Alkaline Phosphatase: 54 U/L (ref 39–117)
BUN: 16 mg/dL (ref 6–23)
Glucose, Bld: 122 mg/dL — ABNORMAL HIGH (ref 70–99)
Sodium: 138 mEq/L (ref 135–145)
Total Bilirubin: 0.7 mg/dL (ref 0.3–1.2)
Total Protein: 6.2 g/dL (ref 6.0–8.3)

## 2011-11-04 LAB — CBC WITH DIFFERENTIAL/PLATELET
EOS%: 1.2 % (ref 0.0–7.0)
Eosinophils Absolute: 0.1 10*3/uL (ref 0.0–0.5)
LYMPH%: 25.2 % (ref 14.0–49.7)
MCH: 39 pg — ABNORMAL HIGH (ref 25.1–34.0)
MCHC: 33.5 g/dL (ref 31.5–36.0)
MCV: 116.2 fL — ABNORMAL HIGH (ref 79.5–101.0)
MONO%: 8.3 % (ref 0.0–14.0)
NEUT#: 5.8 10*3/uL (ref 1.5–6.5)
Platelets: 231 10*3/uL (ref 145–400)
RBC: 2.86 10*6/uL — ABNORMAL LOW (ref 3.70–5.45)

## 2011-11-04 NOTE — Telephone Encounter (Signed)
Gv pt appt for july2013 °

## 2011-11-04 NOTE — Progress Notes (Signed)
   [facility]    OFFICE PROGRESS NOTE   INTERVAL HISTORY:   She returns as scheduled. She began another cycle of Xeloda on 10/18/11. She continues to have approximately 2 loose stools per day. She noted mild bleeding from the right side of the nose. Increased "gas ".  Objective:  Vital signs in last 24 hours:  Blood pressure 147/68, pulse 75, temperature 97.8 F (36.6 C), temperature source Oral, height 5\' 1"  (1.549 m), weight 114 lb 14.4 oz (52.118 kg).    HEENT: Few small abrasions and old blood at the right nasal septum. Oropharynx without thrush or ulcers Resp: Lungs clear bilaterally Cardio: Regular rate and rhythm GI: No hepatosplenomegaly, no mass Vascular: No leg edema  Skin: Mild hyperpigmentation and skin thickening at the palms and soles. Mild erythema at the soles   Lab Results:  Lab Results  Component Value Date   WBC 8.9 11/04/2011   HGB 11.1* 11/04/2011   HCT 33.2* 11/04/2011   MCV 116.2* 11/04/2011   PLT 231 11/04/2011      Medications: I have reviewed the patient's current medications.  Assessment/Plan: 1. Stage IIIc (T4 N2) poorly differentiated adenocarcinoma of the cecum status post right colectomy 06/24/2011. She began adjuvant Xeloda chemotherapy on 07/26/2011. 2. Anemia. Question if related to GI bleeding at presentation and now due to chemotherapy. Hemoglobin is stable to slightly improved. 3. COPD. 4. Family history multiple cancers. 5. Anorexia/weight loss. She continues Megace. She has a good appetite and her weight is stabl  Disposition:  She appears stable. She has completed 5 cycles of adjuvant Xeloda. She will complete cycle 6 beginning on 11/08/2011. Ms. Fabre will return for an office visit on 11/24/2011.   Thornton Papas, MD  11/04/2011  11:22 AM

## 2011-11-05 ENCOUNTER — Encounter: Payer: Self-pay | Admitting: *Deleted

## 2011-11-05 NOTE — Progress Notes (Signed)
RECEIVED A FAX FROM [facility] CONCERNING A CONFIRMATION OF PRESCRIPTION SHIPMENT FOR XELODA. 

## 2011-11-23 ENCOUNTER — Other Ambulatory Visit: Payer: Self-pay | Admitting: *Deleted

## 2011-11-23 DIAGNOSIS — C189 Malignant neoplasm of colon, unspecified: Secondary | ICD-10-CM

## 2011-11-23 MED ORDER — CAPECITABINE 500 MG PO TABS: 1500.0000 mg | ORAL_TABLET | Freq: Two times a day (BID) | ORAL | Status: DC

## 2011-11-23 NOTE — Telephone Encounter (Signed)
THIS REFILL REQUEST FOR XELODA WAS GIVEN TO DR.SHERRILL'S NURSE, SUSAN COWARD,RN. 

## 2011-11-23 NOTE — Telephone Encounter (Signed)
RECEIVED A FAX FROM [facility] CONCERNING A CONFIRMATION OF FACSIMILE RECEIPT FOR XELODA REFERRAL.

## 2011-11-24 ENCOUNTER — Telehealth: Payer: Self-pay | Admitting: Oncology

## 2011-11-24 ENCOUNTER — Other Ambulatory Visit: Payer: Self-pay | Admitting: *Deleted

## 2011-11-24 ENCOUNTER — Other Ambulatory Visit (HOSPITAL_BASED_OUTPATIENT_CLINIC_OR_DEPARTMENT_OTHER): Payer: Medicare Other | Admitting: Lab

## 2011-11-24 ENCOUNTER — Ambulatory Visit (HOSPITAL_BASED_OUTPATIENT_CLINIC_OR_DEPARTMENT_OTHER): Payer: Medicare Other | Admitting: Oncology

## 2011-11-24 VITALS — BP 153/81 | HR 86 | Temp 98.7°F | Ht 61.0 in | Wt 115.6 lb

## 2011-11-24 DIAGNOSIS — R279 Unspecified lack of coordination: Secondary | ICD-10-CM

## 2011-11-24 DIAGNOSIS — C180 Malignant neoplasm of cecum: Secondary | ICD-10-CM

## 2011-11-24 DIAGNOSIS — M810 Age-related osteoporosis without current pathological fracture: Secondary | ICD-10-CM

## 2011-11-24 DIAGNOSIS — R2689 Other abnormalities of gait and mobility: Secondary | ICD-10-CM

## 2011-11-24 DIAGNOSIS — R270 Ataxia, unspecified: Secondary | ICD-10-CM

## 2011-11-24 DIAGNOSIS — C189 Malignant neoplasm of colon, unspecified: Secondary | ICD-10-CM

## 2011-11-24 DIAGNOSIS — D649 Anemia, unspecified: Secondary | ICD-10-CM

## 2011-11-24 LAB — CBC WITH DIFFERENTIAL/PLATELET
Basophils Absolute: 0 10*3/uL (ref 0.0–0.1)
Eosinophils Absolute: 0.1 10*3/uL (ref 0.0–0.5)
HGB: 11.2 g/dL — ABNORMAL LOW (ref 11.6–15.9)
LYMPH%: 29.2 % (ref 14.0–49.7)
MCV: 118.4 fL — ABNORMAL HIGH (ref 79.5–101.0)
MONO%: 9.8 % (ref 0.0–14.0)
NEUT#: 4.5 10*3/uL (ref 1.5–6.5)
Platelets: 212 10*3/uL (ref 145–400)

## 2011-11-24 MED ORDER — ALENDRONATE SODIUM 70 MG PO TABS: 70.0000 mg | ORAL_TABLET | ORAL | Status: DC

## 2011-11-24 NOTE — Telephone Encounter (Signed)
Gave pt appt for July 29th, ML on [facility] 12/16/11

## 2011-11-24 NOTE — Progress Notes (Signed)
   [facility]    OFFICE PROGRESS NOTE   INTERVAL HISTORY:   She completed another cycle of Xeloda beginning on 11/08/2011. She denies nausea and mouth sores. There is dryness of the feet, but no pain. She reports some numbness in the feet and difficulty with balance. She continues to have one normal bowel movement and 2 loose stools daily. This has been a pattern since the time of surgery.  Objective:  Vital signs in last 24 hours:  Blood pressure 153/81, pulse 86, temperature 98.7 F (37.1 C), temperature source Oral, height 5\' 1"  (1.549 m), weight 115 lb 9.6 oz (52.436 kg).    HEENT: No thrush,? 1-2 mm healing ulcer at the left buccal mucosa Resp: Lungs clear bilaterally Cardio: Regular rate and rhythm GI: Nontender, no hepatomegaly Vascular: No leg edema Neuro: She ambulates without difficulty, sensation is intact to touch at the soles of the feet  Skin: No erythema at the palms, mild erythema and dry desquamation at the soles of the feet. No ulcers.   Lab Results:  Lab Results  Component Value Date   WBC 7.6 11/24/2011   HGB 11.2* 11/24/2011   HCT 33.4* 11/24/2011   MCV 118.4* 11/24/2011   PLT 212 11/24/2011   ANC 4.5    Medications: I have reviewed the patient's current medications.  Assessment/Plan: 1. Stage IIIc (T4 N2) poorly differentiated adenocarcinoma of the cecum status post right colectomy 06/24/2011. She began adjuvant Xeloda chemotherapy on 07/26/2011. 2. Anemia. Question if related to GI bleeding at presentation and now due to chemotherapy. Hemoglobin is stable.COPD. 3. Family history multiple cancers. 4. Anorexia/weight loss. She continues Megace. Her weight is increased over the past 2 months 5. Report of balance difficulty and numbness in the feet-we will check a vitamin B 12 level at the next visit.   Disposition:  She continues to tolerate the chemotherapy well. She will begin cycle 7 of adjuvant Xeloda on 11/29/2011. Ms. Canan will  return for an office and lab visit on 12/16/2011.   Thornton Papas, MD  11/24/2011  3:27 PM

## 2011-11-29 ENCOUNTER — Encounter: Payer: Self-pay | Admitting: *Deleted

## 2011-11-29 NOTE — Progress Notes (Signed)
RECEIVED A FAX FROM [facility] CONCERNING A CONFIRMATION OF PRESCRIPTION SHIPMENT FOR XELODA. 

## 2011-12-07 ENCOUNTER — Encounter (INDEPENDENT_AMBULATORY_CARE_PROVIDER_SITE_OTHER): Payer: Self-pay | Admitting: General Surgery

## 2011-12-07 ENCOUNTER — Telehealth: Payer: Self-pay | Admitting: *Deleted

## 2011-12-07 NOTE — Telephone Encounter (Signed)
Due to resume next cycle of Xeloda on 7/29, but appointment not until the afternoon and asking how to get refill? Confirmed her bottle has #1 refill left and instructed her to call [facility] for refill just as she does for any other script she takes at home. Reassured her OK to begin taking her Xeloda in the afternoon 7/29 after visit. Need to check her counts and assess her status prior to next cycle. She verbalizes understanding.

## 2011-12-17 ENCOUNTER — Encounter: Payer: Self-pay | Admitting: *Deleted

## 2011-12-17 NOTE — Progress Notes (Signed)
RECEIVED A FAX FROM [facility] CONCERNING A CONFIRMATION OF PRESCRIPTION SHIPMENT FOR XELODA ON 12/16/11.

## 2011-12-20 ENCOUNTER — Telehealth: Payer: Self-pay | Admitting: Oncology

## 2011-12-20 ENCOUNTER — Other Ambulatory Visit (HOSPITAL_BASED_OUTPATIENT_CLINIC_OR_DEPARTMENT_OTHER): Payer: Medicare Other | Admitting: Lab

## 2011-12-20 ENCOUNTER — Ambulatory Visit: Payer: Medicare Other | Admitting: Nurse Practitioner

## 2011-12-20 ENCOUNTER — Ambulatory Visit (HOSPITAL_BASED_OUTPATIENT_CLINIC_OR_DEPARTMENT_OTHER): Payer: Medicare Other | Admitting: Nurse Practitioner

## 2011-12-20 VITALS — BP 152/79 | HR 68 | Temp 97.3°F | Ht 61.0 in | Wt 116.9 lb

## 2011-12-20 DIAGNOSIS — C180 Malignant neoplasm of cecum: Secondary | ICD-10-CM

## 2011-12-20 DIAGNOSIS — D649 Anemia, unspecified: Secondary | ICD-10-CM

## 2011-12-20 DIAGNOSIS — R270 Ataxia, unspecified: Secondary | ICD-10-CM

## 2011-12-20 DIAGNOSIS — C189 Malignant neoplasm of colon, unspecified: Secondary | ICD-10-CM

## 2011-12-20 DIAGNOSIS — R634 Abnormal weight loss: Secondary | ICD-10-CM

## 2011-12-20 DIAGNOSIS — J449 Chronic obstructive pulmonary disease, unspecified: Secondary | ICD-10-CM

## 2011-12-20 LAB — CBC WITH DIFFERENTIAL/PLATELET
BASO%: 0.5 % (ref 0.0–2.0)
EOS%: 1.9 % (ref 0.0–7.0)
HCT: 34 % — ABNORMAL LOW (ref 34.8–46.6)
LYMPH%: 28.6 % (ref 14.0–49.7)
MCH: 39.9 pg — ABNORMAL HIGH (ref 25.1–34.0)
MCHC: 33.9 g/dL (ref 31.5–36.0)
MCV: 117.7 fL — ABNORMAL HIGH (ref 79.5–101.0)
MONO%: 12 % (ref 0.0–14.0)
NEUT%: 57 % (ref 38.4–76.8)
Platelets: 208 10*3/uL (ref 145–400)
RBC: 2.89 10*6/uL — ABNORMAL LOW (ref 3.70–5.45)
WBC: 6.7 10*3/uL (ref 3.9–10.3)

## 2011-12-20 LAB — COMPREHENSIVE METABOLIC PANEL
ALT: 11 U/L (ref 0–35)
Alkaline Phosphatase: 48 U/L (ref 39–117)
CO2: 23 mEq/L (ref 19–32)
Creatinine, Ser: 0.69 mg/dL (ref 0.50–1.10)
Sodium: 140 mEq/L (ref 135–145)
Total Bilirubin: 0.7 mg/dL (ref 0.3–1.2)
Total Protein: 6.2 g/dL (ref 6.0–8.3)

## 2011-12-20 LAB — CEA: CEA: 7.5 ng/mL — ABNORMAL HIGH (ref 0.0–5.0)

## 2011-12-20 NOTE — Progress Notes (Signed)
OFFICE PROGRESS NOTE  Interval history:  Susan Reed returns as scheduled. She completed cycle 7 Xeloda beginning 11/29/2011. She denies nausea/vomiting. No mouth sores. She continues to have 1 "normal" bowel movement and 2 loose stools daily. She has developed redness and pain over the soles of the feet. She describes the pain as "burning".   Objective: Blood pressure 152/79, pulse 68, temperature 97.3 F (36.3 C), temperature source Oral, height 5\' 1"  (1.549 m), weight 116 lb 14.4 oz (53.025 kg).  Oropharynx is without thrush or ulceration. Lungs are clear. Regular cardiac rhythm. Abdomen is soft and nontender. No hepatomegaly. Extremities are without edema. Fingertips with erythema. No skin breakdown. Soles of the feet with erythema and dry desquamation. No ulcers.  Lab Results: Lab Results  Component Value Date   WBC 6.7 12/20/2011   HGB 11.5* 12/20/2011   HCT 34.0* 12/20/2011   MCV 117.7* 12/20/2011   PLT 208 12/20/2011    Chemistry:    Chemistry      Component Value Date/Time   NA 138 11/04/2011 1034   K 3.6 11/04/2011 1034   CL 107 11/04/2011 1034   CO2 22 11/04/2011 1034   BUN 16 11/04/2011 1034   CREATININE 0.74 11/04/2011 1034      Component Value Date/Time   CALCIUM 9.2 11/04/2011 1034   ALKPHOS 54 11/04/2011 1034   AST 13 11/04/2011 1034   ALT 10 11/04/2011 1034   BILITOT 0.7 11/04/2011 1034       Studies/Results: No results found.  Medications: I have reviewed the patient's current medications.  Assessment/Plan:  1. Stage IIIc (T4 N2) poorly differentiated adenocarcinoma of the cecum status post right colectomy 06/24/2011. She began adjuvant Xeloda chemotherapy on 07/26/2011. 2. Anemia. Question if related to GI bleeding at presentation and now due to chemotherapy. Hemoglobin is stable. 3. COPD. 4. Family history multiple cancers. 5. Anorexia/weight loss. She continues Megace. Weight overall is stable to slightly improved. 6. Report of balance difficulty and  numbness in the feet when here 11/24/2011. Vtamin B 12 level is pending. 7. Hand-foot syndrome secondary to Xeloda.  Disposition-[name] has hand-foot syndrome related to Xeloda. Plan to proceed with the eighth and final cycle of Xeloda as scheduled beginning 12/21/2011. If current symptoms worsen she will discontinue Xeloda and contact the office. Otherwise she will return for a followup visit in 4 weeks.  Plan reviewed with Dr. Truett Perna.   Susan Reed ANP/GNP-BC

## 2011-12-20 NOTE — Telephone Encounter (Signed)
appts made and printed for pt  °

## 2011-12-22 ENCOUNTER — Other Ambulatory Visit: Payer: Self-pay | Admitting: *Deleted

## 2011-12-22 DIAGNOSIS — D649 Anemia, unspecified: Secondary | ICD-10-CM

## 2011-12-22 DIAGNOSIS — C189 Malignant neoplasm of colon, unspecified: Secondary | ICD-10-CM

## 2011-12-22 DIAGNOSIS — R200 Anesthesia of skin: Secondary | ICD-10-CM

## 2011-12-24 ENCOUNTER — Telehealth: Payer: Self-pay | Admitting: *Deleted

## 2011-12-24 NOTE — Telephone Encounter (Signed)
Reports some lightheadedness and loss of balance with position changes. Noted this began yesterday. Denies any new medications and she is not taking her Compazine. Says she is eating and drinking without difficulty. She has a BP monitor at home, but battery is dead. She will have daughter replace and check her BP and will report Monday. Instructed her to push fluids and change positions slowly.

## 2011-12-27 ENCOUNTER — Telehealth: Payer: Self-pay | Admitting: *Deleted

## 2011-12-27 NOTE — Telephone Encounter (Signed)
Reports her vertigo persists and she is now having a pressure-like feeling in one of her ears. She reports her blood pressure readings were normal. Instructed her to call her PCP to be seen. Highly doubtful this is related to her chemotherapy. If an antibiotic is required, it will not interfere with her chemo.

## 2012-01-04 ENCOUNTER — Other Ambulatory Visit: Payer: Self-pay | Admitting: *Deleted

## 2012-01-04 NOTE — Telephone Encounter (Addendum)
THIS REFILL REQUEST FOR XELODA WAS GIVEN TO DR.SHERRILL'S NURSE, SUSAN COWARD,RN. 

## 2012-01-18 ENCOUNTER — Other Ambulatory Visit (HOSPITAL_BASED_OUTPATIENT_CLINIC_OR_DEPARTMENT_OTHER): Payer: Medicare Other | Admitting: Lab

## 2012-01-18 ENCOUNTER — Ambulatory Visit (HOSPITAL_BASED_OUTPATIENT_CLINIC_OR_DEPARTMENT_OTHER): Payer: Medicare Other | Admitting: Nurse Practitioner

## 2012-01-18 ENCOUNTER — Telehealth: Payer: Self-pay | Admitting: Internal Medicine

## 2012-01-18 VITALS — BP 161/71 | HR 72 | Temp 98.5°F | Resp 18 | Ht 61.0 in | Wt 118.6 lb

## 2012-01-18 DIAGNOSIS — C189 Malignant neoplasm of colon, unspecified: Secondary | ICD-10-CM

## 2012-01-18 DIAGNOSIS — L270 Generalized skin eruption due to drugs and medicaments taken internally: Secondary | ICD-10-CM

## 2012-01-18 DIAGNOSIS — C180 Malignant neoplasm of cecum: Secondary | ICD-10-CM

## 2012-01-18 DIAGNOSIS — D649 Anemia, unspecified: Secondary | ICD-10-CM

## 2012-01-18 DIAGNOSIS — R200 Anesthesia of skin: Secondary | ICD-10-CM

## 2012-01-18 DIAGNOSIS — R42 Dizziness and giddiness: Secondary | ICD-10-CM

## 2012-01-18 LAB — CBC WITH DIFFERENTIAL/PLATELET
Basophils Absolute: 0 10*3/uL (ref 0.0–0.1)
Eosinophils Absolute: 0.2 10*3/uL (ref 0.0–0.5)
HCT: 35.7 % (ref 34.8–46.6)
HGB: 12.1 g/dL (ref 11.6–15.9)
MCH: 37 pg — ABNORMAL HIGH (ref 25.1–34.0)
MCV: 109.2 fL — ABNORMAL HIGH (ref 79.5–101.0)
MONO%: 10.8 % (ref 0.0–14.0)
NEUT#: 2.5 10*3/uL (ref 1.5–6.5)
NEUT%: 43.2 % (ref 38.4–76.8)
RDW: 14.6 % — ABNORMAL HIGH (ref 11.2–14.5)
lymph#: 2.4 10*3/uL (ref 0.9–3.3)

## 2012-01-18 LAB — VITAMIN B12: Vitamin B-12: 246 pg/mL (ref 211–911)

## 2012-01-18 LAB — CEA: CEA: 5.5 ng/mL — ABNORMAL HIGH (ref 0.0–5.0)

## 2012-01-18 NOTE — Progress Notes (Signed)
OFFICE PROGRESS NOTE  Interval history:  Susan Reed returns as scheduled. She completed the eighth and final cycle of Xeloda beginning 12/21/2011. No hand pain or redness. She notes mild redness on the feet. No pain. No nausea or vomiting. She denies mouth sores. She continues to have 1 "normal" bowel movement and 2 loose stools daily. She reports recent "vertigo". The vertigo has improved.   Objective: Blood pressure 161/71, pulse 72, temperature 98.5 F (36.9 C), temperature source Oral, resp. rate 18, height 5\' 1"  (1.549 m), weight 118 lb 9.6 oz (53.797 kg).  Oropharynx is without thrush or ulceration. Lungs are clear. Regular cardiac rhythm. Abdomen is soft and nontender. No hepatomegaly. Extremities are without edema. Palms are without erythema. No skin breakdown. Soles of the feet with mild erythema and dry desquamation.  Lab Results: Lab Results  Component Value Date   WBC 5.7 01/18/2012   HGB 12.1 01/18/2012   HCT 35.7 01/18/2012   MCV 109.2* 01/18/2012   PLT 156 01/18/2012    Chemistry:    Chemistry      Component Value Date/Time   NA 140 12/20/2011 1333   K 3.5 12/20/2011 1333   CL 106 12/20/2011 1333   CO2 23 12/20/2011 1333   BUN 15 12/20/2011 1333   CREATININE 0.69 12/20/2011 1333      Component Value Date/Time   CALCIUM 9.1 12/20/2011 1333   ALKPHOS 48 12/20/2011 1333   AST 14 12/20/2011 1333   ALT 11 12/20/2011 1333   BILITOT 0.7 12/20/2011 1333       Studies/Results: No results found.  Medications: I have reviewed the patient's current medications.  Assessment/Plan:  1. Stage IIIc (T4 N2) poorly differentiated adenocarcinoma of the cecum status post right colectomy 06/24/2011. She began adjuvant Xeloda chemotherapy on 07/26/2011. She completed the eighth and final cycle beginning 12/21/2011. 2. Anemia. Question if related to GI bleeding at presentation and now due to chemotherapy. Hemoglobin is improved. 3. COPD. 4. Family history multiple  cancers. 5. Anorexia/weight loss. Weight is better. She will discontinue Megace. 6. Report of balance difficulty and numbness in the feet when here 11/24/2011. Vtamin B 12 level in low normal range on 12/20/2011. Repeat level today is pending. 7. Hand-foot syndrome secondary to Xeloda. 8. Recent vertigo.  Disposition-Susan Reed appears stable. She has completed the course of adjuvant Xeloda chemotherapy. She will return for a followup visit and CEA in 6 months. We are referring her to Dr. Arlyce Dice for a colonoscopy in January 2014. She will contact our office prior to her next visit with any problems.  Plan reviewed with Dr. Truett Perna.  Susan Reed ANP/GNP-BC

## 2012-01-18 NOTE — Telephone Encounter (Signed)
Gave pt appt for February 2014 lab and MD °

## 2012-01-19 ENCOUNTER — Ambulatory Visit (INDEPENDENT_AMBULATORY_CARE_PROVIDER_SITE_OTHER): Payer: Medicare Other | Admitting: General Surgery

## 2012-01-19 ENCOUNTER — Encounter (INDEPENDENT_AMBULATORY_CARE_PROVIDER_SITE_OTHER): Payer: Self-pay | Admitting: General Surgery

## 2012-01-19 VITALS — BP 160/64 | HR 72 | Temp 97.4°F | Resp 16 | Ht 61.0 in | Wt 118.2 lb

## 2012-01-19 DIAGNOSIS — Z85038 Personal history of other malignant neoplasm of large intestine: Secondary | ICD-10-CM

## 2012-01-19 NOTE — Progress Notes (Signed)
Subjective:     Patient ID: Susan Reed, female   DOB: 07-Aug-1924, 78 y.o.   MRN: [id number]  HPI Susan Reed follows upseveral months status post laparoscopic right hemicolectomy for advanced colon cancer. She has recently completed her chemotherapy  And seems to be feeling fairly well. She has had some vertigo and has taken meclizine for this but she says that this is improving as well. Her main complaint is that she still has some loose stools where she feels so her activities are limited because she cannot leave the house for fear of having to go to the bathroom rapidly. She otherwise denies any weight loss or abdominal pain. She denies any rectal bleeding except for some occasional bright blood which she attributes to her hemorrhoids but even this has improved. Her CEA recently was 5.5 which is down from 7.5 although still elevated.  Review of Systems     Objective:   Physical Exam No distress and nontoxic-appearing Her abdomen is soft and nontender an exam her incisions are well-healed without sign of infection or hernia. I do not appreciate any masses.    Assessment:     History of colon cancer She seems to be recovered fairly well from her surgery although she still has some frequent bowel movements and has been taking some Imodium for this on when necessary basis. She has completed her chemotherapy and is feeling better.her vertigo has been improving as well. From a surgical standpoint, she seems to be doing great. I'm not sure if her bowels will improve or not. Now that she has completed her chemotherapy only time will tell if this is going to resolve or not. Her CEA levels is slightly elevated bili is improved from her last value. I recommend that we repeat this in 6 months and she will be due for a colonoscopy at that time as well and we will have her see Dr. Arlyce Dice for repeat colonoscopy.    Plan:     We will set her up for repeat colonoscopy with Dr. Arlyce Dice in January as well as  a CEA level. We will see her back for evaluation after her colonoscopy and CEA level.

## 2012-01-20 ENCOUNTER — Other Ambulatory Visit: Payer: Self-pay | Admitting: Nurse Practitioner

## 2012-01-20 DIAGNOSIS — C189 Malignant neoplasm of colon, unspecified: Secondary | ICD-10-CM

## 2012-01-21 ENCOUNTER — Telehealth: Payer: Self-pay | Admitting: Oncology

## 2012-01-21 NOTE — Telephone Encounter (Signed)
S/w the pt's dtr and she is aware of the sept appts

## 2012-02-15 ENCOUNTER — Telehealth: Payer: Self-pay | Admitting: *Deleted

## 2012-02-15 ENCOUNTER — Other Ambulatory Visit: Payer: Medicare Other | Admitting: Lab

## 2012-02-15 DIAGNOSIS — C189 Malignant neoplasm of colon, unspecified: Secondary | ICD-10-CM

## 2012-02-15 NOTE — Telephone Encounter (Signed)
Faxed release for dental extraction from oncology standpoint - chemo is completed.

## 2012-02-17 ENCOUNTER — Telehealth: Payer: Self-pay | Admitting: *Deleted

## 2012-02-17 ENCOUNTER — Telehealth: Payer: Self-pay | Admitting: Oncology

## 2012-02-17 NOTE — Telephone Encounter (Signed)
Called and spoke with pt; per Dr. Truett Perna cea is now normal.  Pt verbalized understanding and confirmed appt 02/21/12 at 3:45.

## 2012-02-17 NOTE — Telephone Encounter (Signed)
Talked to patient , she asked me to call her daughter [place], called daughter left message regarding appt with GI, Dr. Arlyce Dice on 03/15/12

## 2012-02-17 NOTE — Telephone Encounter (Signed)
Message copied by Gala Romney on Thu Feb 17, 2012  2:36 PM ------      Message from: Thornton Papas B      Created: Wed Feb 16, 2012 10:27 PM       Please call patient, CEA is now normal

## 2012-02-18 ENCOUNTER — Ambulatory Visit: Payer: Medicare Other | Admitting: Oncology

## 2012-02-21 ENCOUNTER — Ambulatory Visit (HOSPITAL_BASED_OUTPATIENT_CLINIC_OR_DEPARTMENT_OTHER): Payer: Medicare Other | Admitting: Oncology

## 2012-02-21 VITALS — BP 122/73 | HR 81 | Temp 98.4°F | Resp 18 | Ht 61.0 in | Wt 119.2 lb

## 2012-02-21 DIAGNOSIS — C180 Malignant neoplasm of cecum: Secondary | ICD-10-CM

## 2012-02-21 DIAGNOSIS — Z809 Family history of malignant neoplasm, unspecified: Secondary | ICD-10-CM

## 2012-02-21 DIAGNOSIS — D649 Anemia, unspecified: Secondary | ICD-10-CM

## 2012-02-21 DIAGNOSIS — F329 Major depressive disorder, single episode, unspecified: Secondary | ICD-10-CM

## 2012-02-21 DIAGNOSIS — C189 Malignant neoplasm of colon, unspecified: Secondary | ICD-10-CM

## 2012-02-21 MED ORDER — MIRTAZAPINE 15 MG PO TABS: 15.0000 mg | ORAL_TABLET | Freq: Every day | ORAL | Status: DC

## 2012-02-21 NOTE — Progress Notes (Signed)
   [facility]    OFFICE PROGRESS NOTE   INTERVAL HISTORY:   She returns prior to a scheduled visit. The CEA was unexpectedly elevated at 7.5 on 12/20/2011. A repeat CEA on 01/18/2012 was again mildly elevated at 5.5. The CEA returned at 3.2 on 02/15/2012. She feels "depressed ". She reports stable diarrhea since undergoing surgery. She has a poor appetite. Intermittent nausea, but no emesis.  Ms. Garduno has been referred to Dr. Arlyce Dice for a surveillance colonoscopy in January of next year.  Objective:  Vital signs in last 24 hours:  Blood pressure 122/73, pulse 81, temperature 98.4 F (36.9 C), temperature source Oral, resp. rate 18, height 5\' 1"  (1.549 m), weight 119 lb 3.2 oz (54.069 kg).    HEENT: Neck without mass Lymphatics: No cervical, supraclavicular, axillary, or inguinal nodes Resp: Good air movement bilaterally, no respiratory distress, coarse rhonchi at the left base Cardio: Regular rate and rhythm GI: No hepatosplenomegaly, nontender, no mass Vascular: No leg edema   Lab Results:  Lab Results  Component Value Date   WBC 5.7 01/18/2012   HGB 12.1 01/18/2012   HCT 35.7 01/18/2012   MCV 109.2* 01/18/2012   PLT 156 01/18/2012   by me 12 on 01/18/2012-246  CEA 3.2 on 02/15/2012.  Medications: I have reviewed the patient's current medications.  Assessment/Plan: 1. Stage IIIc (T4 N2) poorly differentiated adenocarcinoma of the cecum status post right colectomy 06/24/2011. She began adjuvant Xeloda chemotherapy on 07/26/2011. She completed the eighth and final cycle beginning 12/21/2011. 2. Anemia. Question if related to GI bleeding at presentation and then chemotherapy. Improved 3. COPD. 4. Family history multiple cancers. 5. Anorexia/weight loss. Weight is stable, now off of Megace 6. Report of balance difficulty and numbness in the feet when here 11/24/2011. Vtamin B 12 level in low normal range on 12/20/2011. Repeat level today was normal on  01/18/2012 7. History of Hand-foot syndrome secondary to Xeloda. 8. History of vertigo 9. Depression-she will be started on a trial of Remeron 10. Mildly elevated CEA July and August 2013-? Etiology. The CEA returned normal in September of 2013.? Related to chronic bronchitis or other inflammation.   Disposition:  She remains in clinical remission from colon cancer. She will be scheduled for a surveillance colonoscopy with Dr. Arlyce Dice in January of 2014. Ms. Vaccari appears to have significant depression. She will start a trial of Remeron and she will followup with Dr. Duanne Guess.  She will return for an office visit and CEA in 3 months. We will proceed with a restaging CT evaluation if the CEA is again elevated.  Thornton Papas, MD  02/21/2012  4:31 PM

## 2012-02-22 ENCOUNTER — Telehealth: Payer: Self-pay | Admitting: Oncology

## 2012-02-22 NOTE — Telephone Encounter (Signed)
S/w esmeralda from [facility] and the pt is not due for her colonoscopy appt until jan 2014. Schedules are not out that far pt will be notified by mail.

## 2012-02-22 NOTE — Telephone Encounter (Signed)
S/w the pt and she is aware of her dec appts °

## 2012-03-15 ENCOUNTER — Ambulatory Visit: Payer: Medicare Other | Admitting: Gastroenterology

## 2012-04-15 ENCOUNTER — Other Ambulatory Visit: Payer: Self-pay | Admitting: Oncology

## 2012-04-15 DIAGNOSIS — C189 Malignant neoplasm of colon, unspecified: Secondary | ICD-10-CM

## 2012-04-15 DIAGNOSIS — F329 Major depressive disorder, single episode, unspecified: Secondary | ICD-10-CM

## 2012-05-11 ENCOUNTER — Other Ambulatory Visit: Payer: Medicare Other | Admitting: Lab

## 2012-05-11 ENCOUNTER — Ambulatory Visit: Payer: Medicare Other | Admitting: Nurse Practitioner

## 2012-05-11 ENCOUNTER — Telehealth: Payer: Self-pay | Admitting: Oncology

## 2012-05-11 ENCOUNTER — Ambulatory Visit (HOSPITAL_BASED_OUTPATIENT_CLINIC_OR_DEPARTMENT_OTHER): Payer: Medicare Other | Admitting: Nurse Practitioner

## 2012-05-11 VITALS — BP 150/56 | HR 71 | Temp 98.4°F | Resp 20 | Ht 61.0 in | Wt 116.7 lb

## 2012-05-11 DIAGNOSIS — C189 Malignant neoplasm of colon, unspecified: Secondary | ICD-10-CM

## 2012-05-11 DIAGNOSIS — C180 Malignant neoplasm of cecum: Secondary | ICD-10-CM

## 2012-05-11 DIAGNOSIS — D649 Anemia, unspecified: Secondary | ICD-10-CM

## 2012-05-11 NOTE — Progress Notes (Signed)
OFFICE PROGRESS NOTE  Interval history:  Susan Reed returns as scheduled. She feels well. She and her son have noted marked improvement in the depression symptoms since beginning Remeron. She reports a good appetite. She eats small frequent meals. No nausea or vomiting. No abdominal pain. She denies rectal bleeding. She has 2 stools per day. She takes Imodium as needed. When showering she intermittently notes numbness and discoloration of the right lower leg/foot. She denies pain.   Objective: Blood pressure 150/56, pulse 71, temperature 98.4 F (36.9 C), temperature source Oral, resp. rate 20, height 5\' 1"  (1.549 m), weight 116 lb 11.2 oz (52.935 kg).  Oropharynx is without thrush or ulceration. No palpable cervical, subclavicular, axillary or inguinal lymph nodes. Lungs are clear. Regular cardiac rhythm. Abdomen is soft and nontender. No organomegaly. Extremities are without edema. Motor strength 5 over 5. Right pedal pulse 2+. The foot is warm and normal color. Good capillary refill.  Lab Results: Lab Results  Component Value Date   WBC 5.7 01/18/2012   HGB 12.1 01/18/2012   HCT 35.7 01/18/2012   MCV 109.2* 01/18/2012   PLT 156 01/18/2012    Chemistry:    Chemistry      Component Value Date/Time   NA 140 12/20/2011 1333   K 3.5 12/20/2011 1333   CL 106 12/20/2011 1333   CO2 23 12/20/2011 1333   BUN 15 12/20/2011 1333   CREATININE 0.69 12/20/2011 1333      Component Value Date/Time   CALCIUM 9.1 12/20/2011 1333   ALKPHOS 48 12/20/2011 1333   AST 14 12/20/2011 1333   ALT 11 12/20/2011 1333   BILITOT 0.7 12/20/2011 1333       Studies/Results: No results found.  Medications: I have reviewed the patient's current medications.  Assessment/Plan:  1. Stage IIIc (T4 N2) poorly differentiated adenocarcinoma of the cecum status post right colectomy 06/24/2011. She began adjuvant Xeloda chemotherapy on 07/26/2011. She completed the eighth and final cycle beginning 12/21/2011. 2. Anemia.  Question if related to GI bleeding at presentation and then chemotherapy. Improved 3. COPD. 4. Family history multiple cancers. 5. Anorexia/weight loss. Weight is stable, now off of Megace. 6. Report of balance difficulty and numbness in the feet when here 11/24/2011. Vtamin B 12 level in low normal range on 12/20/2011. Repeat level today was normal on 01/18/2012. 7. History of hand-foot syndrome secondary to Xeloda. Resolved. 8. History of vertigo. 9. Depression-she was started on a trial of Remeron following office visit 02/21/2012 with significant improvement. 10. Mildly elevated CEA July and August 2013-? Etiology. The CEA returned normal in September of 2013.? related to chronic bronchitis or other inflammation.  Disposition-Susan Reed appears stable. She remains in clinical remission from colon cancer. We will followup on the CEA from today. She will return for a followup visit in 6 months with a CEA. We will adjust the appointment accordingly pending the CEA result from today. She will followup with Dr. Duanne Guess regarding the intermittent numbness and discoloration of the right lower leg/foot.  Plan reviewed with Dr. Truett Perna.  Susan Reed ANP/GNP-BC

## 2012-05-11 NOTE — Telephone Encounter (Signed)
appts made and printed for pt aom °

## 2012-05-16 ENCOUNTER — Telehealth: Payer: Self-pay | Admitting: *Deleted

## 2012-05-16 NOTE — Telephone Encounter (Signed)
Pt notified of results below. To follow up as scheduled

## 2012-05-16 NOTE — Telephone Encounter (Signed)
Message copied by Phillis Knack on Tue May 16, 2012 10:54 AM ------      Message from: Wandalee Ferdinand      Created: Tue May 16, 2012 10:11 AM                   ----- Message -----         From: Ladene Artist, MD         Sent: 05/13/2012   8:33 PM           To: Wandalee Ferdinand, RN, Glori Luis, RN, #            Please call patient, cea is normal, f/u as scheduled

## 2012-06-08 ENCOUNTER — Other Ambulatory Visit: Payer: Self-pay | Admitting: Oncology

## 2012-06-09 ENCOUNTER — Encounter (INDEPENDENT_AMBULATORY_CARE_PROVIDER_SITE_OTHER): Payer: Self-pay | Admitting: General Surgery

## 2012-06-09 ENCOUNTER — Encounter (INDEPENDENT_AMBULATORY_CARE_PROVIDER_SITE_OTHER): Payer: Self-pay | Admitting: Surgery

## 2012-06-14 ENCOUNTER — Ambulatory Visit (INDEPENDENT_AMBULATORY_CARE_PROVIDER_SITE_OTHER): Payer: Medicare Other | Admitting: Gastroenterology

## 2012-06-14 ENCOUNTER — Encounter: Payer: Self-pay | Admitting: Gastroenterology

## 2012-06-14 ENCOUNTER — Ambulatory Visit: Payer: Medicare Other | Admitting: Gastroenterology

## 2012-06-14 VITALS — BP 124/68 | HR 72 | Ht 61.0 in | Wt 117.0 lb

## 2012-06-14 DIAGNOSIS — C189 Malignant neoplasm of colon, unspecified: Secondary | ICD-10-CM

## 2012-06-14 NOTE — Patient Instructions (Addendum)
You have been scheduled for a colonoscopy with propofol. Please follow written instructions given to you at your visit today.  Please pick up your prep kit at the pharmacy within the next 1-3 days. If you use inhalers (even only as needed) or a CPAP machine, please bring them with you on the day of your procedure.  Suprep given

## 2012-06-14 NOTE — Progress Notes (Signed)
History of Present Illness: 79 year old Hispanic female status post right hemicolectomy for a T4 N2 poorly differentiated adeno CA in March, 2013 followed by chemotherapy here to schedule followup colonoscopy. Except for some mild, loose stools which is well-controlled with Imodium she has no GI complaints. Specifically, she is without abdominal pain or rectal bleeding.    Past Medical History  Diagnosis Date  . Anemia   . COPD (chronic obstructive pulmonary disease)   . Hyperlipidemia   . Osteoporosis   . Hypertension   . Glaucoma(365)   . Hypothyroidism   . Blood transfusion     2009  . Colon cancer     colon cancer   . Rectal bleeding   . Abdominal bloating    Past Surgical History  Procedure Date  . Tonsillectomy   . Right colectomy 2013  . Band hemorrhoidectomy     by Dr. Gerrit Friends   family history includes Cancer in her brother and sister and Heart disease in her father.  There is no history of Colon cancer. Current Outpatient Prescriptions  Medication Sig Dispense Refill  . alendronate (FOSAMAX) 70 MG tablet Take 1 tablet (70 mg total) by mouth every 7 (seven) days. Take with a full glass of water on an empty stomach. Wednesday  12 tablet  0  . aspirin 81 MG tablet Take 81 mg by mouth daily with breakfast.       . Calcium Carbonate-Vit D-Min (CALTRATE PLUS PO) Take 1 tablet by mouth daily.       . Cholecalciferol (VITAMIN D-3 PO) Take 1,000 Units by mouth daily.       . cycloSPORINE (RESTASIS) 0.05 % ophthalmic emulsion Place 1 drop into both eyes 2 (two) times daily.       . dorzolamide-timolol (COSOPT) 22.3-6.8 MG/ML ophthalmic solution Place 1 drop into both eyes 2 (two) times daily.       . fish oil-omega-3 fatty acids 1000 MG capsule Take 1 g by mouth daily.       . Lactobacillus (ACIDOPHILUS PO) Take by mouth daily.      Marland Kitchen levothyroxine (SYNTHROID, LEVOTHROID) 25 MCG tablet Take 25 mcg by mouth daily before breakfast.       . loperamide (IMODIUM) 2 MG capsule Take 2  mg by mouth as needed. Usually takes 1/2 tab prn      . meclizine (ANTIVERT) 25 MG tablet Take 25 mg by mouth as needed.       . mirtazapine (REMERON) 15 MG tablet TAKE 1 TABLET (15 MG TOTAL) BY MOUTH AT BEDTIME.  30 tablet  1  . Multiple Vitamin (MULTIVITAMIN) capsule Take 1 capsule by mouth daily.      . prochlorperazine (COMPAZINE) 10 MG tablet Take 1 tablet (10 mg total) by mouth every 6 (six) hours as needed (nausea).  60 tablet  1  . TOBRADEX ophthalmic ointment Place 1 application into both eyes at bedtime.       . [DISCONTINUED] metoprolol tartrate (LOPRESSOR) 25 MG tablet Take 1 tablet (25 mg total) by mouth 2 (two) times daily.  60 tablet  1   Allergies as of 06/14/2012 - Review Complete 06/14/2012  Allergen Reaction Noted  . Codeine Nausea And Vomiting 06/02/2011  . Lactose intolerance (gi)  07/21/2011    reports that she has never smoked. She has never used smokeless tobacco. She reports that she drinks alcohol. She reports that she does not use illicit drugs.     Review of Systems: Pertinent positive and negative review  of systems were noted in the above HPI section. All other review of systems were otherwise negative.  Vital signs were reviewed in today's medical record Physical Exam: General: Well developed , well nourished, no acute distress Skin: anicteric Head: Normocephalic and atraumatic Eyes:  sclerae anicteric, EOMI Ears: Normal auditory acuity Mouth: No deformity or lesions Neck: Supple, no masses or thyromegaly Lungs: Clear throughout to auscultation Heart: Regular rate and rhythm; no murmurs, rubs or bruits Abdomen: Soft, non tender and non distended. No masses, hepatosplenomegaly or hernias noted. Normal Bowel sounds Rectal:deferred Musculoskeletal: Symmetrical with no gross deformities  Skin: No lesions on visible extremities Pulses:  Normal pulses noted Extremities: No clubbing, cyanosis, edema or deformities noted Neurological: Alert oriented x 4,  grossly nonfocal Cervical Nodes:  No significant cervical adenopathy Inguinal Nodes: No significant inguinal adenopathy Psychological:  Alert and cooperative. Normal mood and affect

## 2012-06-14 NOTE — Assessment & Plan Note (Signed)
Plan followup colonoscopy 

## 2012-07-04 ENCOUNTER — Telehealth: Payer: Self-pay | Admitting: Gastroenterology

## 2012-07-04 NOTE — Telephone Encounter (Signed)
No

## 2012-07-06 ENCOUNTER — Encounter: Payer: Medicare Other | Admitting: Gastroenterology

## 2012-07-07 ENCOUNTER — Encounter: Payer: Medicare Other | Admitting: Gastroenterology

## 2012-07-20 ENCOUNTER — Other Ambulatory Visit: Payer: Medicare Other | Admitting: Lab

## 2012-07-20 ENCOUNTER — Ambulatory Visit: Payer: Medicare Other | Admitting: Oncology

## 2012-07-26 ENCOUNTER — Ambulatory Visit (INDEPENDENT_AMBULATORY_CARE_PROVIDER_SITE_OTHER): Payer: Medicare Other | Admitting: General Surgery

## 2012-08-08 ENCOUNTER — Ambulatory Visit (AMBULATORY_SURGERY_CENTER): Payer: Medicare Other | Admitting: Gastroenterology

## 2012-08-08 ENCOUNTER — Encounter: Payer: Self-pay | Admitting: Gastroenterology

## 2012-08-08 VITALS — BP 148/95 | HR 61 | Temp 98.1°F | Resp 24 | Ht 61.0 in | Wt 117.0 lb

## 2012-08-08 DIAGNOSIS — C189 Malignant neoplasm of colon, unspecified: Secondary | ICD-10-CM

## 2012-08-08 MED ORDER — SODIUM CHLORIDE 0.9 % IV SOLN: 500.0000 mL | INTRAVENOUS | Status: DC

## 2012-08-08 NOTE — Patient Instructions (Signed)
YOU HAD AN ENDOSCOPIC PROCEDURE TODAY AT THE [facility]: Refer to the procedure report that was given to you for any specific questions about what was found during the examination.  If the procedure report does not answer your questions, please call your gastroenterologist to clarify.  If you requested that your care partner not be given the details of your procedure findings, then the procedure report has been included in a sealed envelope for you to review at your convenience later.  YOU SHOULD EXPECT: Some feelings of bloating in the abdomen. Passage of more gas than usual.  Walking can help get rid of the air that was put into your GI tract during the procedure and reduce the bloating. If you had a lower endoscopy (such as a colonoscopy or flexible sigmoidoscopy) you may notice spotting of blood in your stool or on the toilet paper. If you underwent a bowel prep for your procedure, then you may not have a normal bowel movement for a few days.  DIET: Your first meal following the procedure should be a light meal and then it is ok to progress to your normal diet.  A half-sandwich or bowl of soup is an example of a good first meal.  Heavy or fried foods are harder to digest and may make you feel nauseous or bloated.  Likewise meals heavy in dairy and vegetables can cause extra gas to form and this can also increase the bloating.  Drink plenty of fluids but you should avoid alcoholic beverages for 24 hours.  ACTIVITY: Your care partner should take you home directly after the procedure.  You should plan to take it easy, moving slowly for the rest of the day.  You can resume normal activity the day after the procedure however you should NOT DRIVE or use heavy machinery for 24 hours (because of the sedation medicines used during the test).    SYMPTOMS TO REPORT IMMEDIATELY: A gastroenterologist can be reached at any hour.  During normal business hours, 8:30 AM to 5:00 PM Monday through Friday,  call [phone].  After hours and on weekends, please call the GI answering service at [phone] who will take a message and have the physician on call contact you.   Following lower endoscopy (colonoscopy or flexible sigmoidoscopy):  Excessive amounts of blood in the stool  Significant tenderness or worsening of abdominal pains  Swelling of the abdomen that is new, acute  Fever of 100F or higher    FOLLOW UP: If any biopsies were taken you will be contacted by phone or by letter within the next 1-3 weeks.  Call your gastroenterologist if you have not heard about the biopsies in 3 weeks.  Our staff will call the home number listed on your records the next business day following your procedure to check on you and address any questions or concerns that you may have at that time regarding the information given to you following your procedure. This is a courtesy call and so if there is no answer at the home number and we have not heard from you through the emergency physician on call, we will assume that you have returned to your regular daily activities without incident.  SIGNATURES/CONFIDENTIALITY: You and/or your care partner have signed paperwork which will be entered into your electronic medical record.  These signatures attest to the fact that that the information above on your After Visit Summary has been reviewed and is understood.  Full responsibility of the confidentiality   of this discharge information lies with you and/or your care-partner.     

## 2012-08-08 NOTE — Op Note (Signed)
[facility] [address] [place], [address]   COLONOSCOPY PROCEDURE REPORT  PATIENT: Susan, Reed  MR#: [id number] BIRTHDATE: Aug 31, 1924 , 87  yrs. old GENDER: Female ENDOSCOPIST: Louis Meckel, MD REFERRED BY: PROCEDURE DATE:  08/08/2012 PROCEDURE:   Colonoscopy, diagnostic ASA CLASS:   Class II INDICATIONS:High risk patient with personal history of colon cancer. (colon resection 2013) MEDICATIONS: MAC sedation, administered by CRNA and propofol (Diprivan) 200mg  IV  DESCRIPTION OF PROCEDURE:   After the risks benefits and alternatives of the procedure were thoroughly explained, informed consent was obtained.  A digital rectal exam revealed no abnormalities of the rectum.   The LB [id number]  endoscope was introduced through the anus and advanced to the surgical anastomosis. No adverse events experienced.   The quality of the prep was Suprep excellent  The instrument was then slowly withdrawn as the colon was fully examined.      COLON FINDINGS: A normal appearing cecum, ileocecal valve, and appendiceal orifice were identified.  The ascending, hepatic flexure, transverse, splenic flexure, descending, sigmoid colon and rectum appeared unremarkable.  No polyps or cancers were seen. Retroflexed views revealed no abnormalities. The time to cecum=3 minutes 26 seconds.  Withdrawal time=6 minutes 0 seconds.  The scope was withdrawn and the procedure completed. COMPLICATIONS: There were no complications.  ENDOSCOPIC IMPRESSION: Normal colon  RECOMMENDATIONS: Given your age, you will not need another colonoscopy for colon cancer screening or polyp surveillance.  These types of tests usually stop around the age 61.   eSigned:  Louis Meckel, MD 08/08/2012 2:08 PM   cc: Rolm Baptise, MD and Asencion Partridge, MD

## 2012-08-09 ENCOUNTER — Telehealth: Payer: Self-pay | Admitting: *Deleted

## 2012-08-09 DIAGNOSIS — T7840XA Allergy, unspecified, initial encounter: Secondary | ICD-10-CM

## 2012-08-09 HISTORY — DX: Allergy, unspecified, initial encounter: T78.40XA

## 2012-08-09 NOTE — Telephone Encounter (Signed)
  Follow up Call-  Call back number 08/08/2012 06/08/2011  Post procedure Call Back phone  # [phone] or [phone] ok to leave message  Permission to leave phone message Yes -     Patient questions:  Do you have a fever, pain , or abdominal swelling? no Pain Score  0 *  Have you tolerated food without any problems? yes  Have you been able to return to your normal activities? yes  Do you have any questions about your discharge instructions: Diet   no Medications  no Follow up visit  no  Do you have questions or concerns about your Care? no  Actions: * If pain score is 4 or above: No action needed, pain <4.

## 2012-09-14 ENCOUNTER — Ambulatory Visit (INDEPENDENT_AMBULATORY_CARE_PROVIDER_SITE_OTHER): Payer: Medicare Other | Admitting: General Surgery

## 2012-09-14 ENCOUNTER — Encounter (INDEPENDENT_AMBULATORY_CARE_PROVIDER_SITE_OTHER): Payer: Self-pay | Admitting: General Surgery

## 2012-09-14 VITALS — BP 114/66 | HR 60 | Resp 14 | Ht 61.0 in | Wt 113.0 lb

## 2012-09-14 DIAGNOSIS — Z85038 Personal history of other malignant neoplasm of large intestine: Secondary | ICD-10-CM

## 2012-09-14 NOTE — Progress Notes (Signed)
Subjective:     Patient ID: Susan Reed, female   DOB: August 28, 1924, 79 y.o.   MRN: [id number]  HPI This patient follows up one year status post kaleidoscopic right hemicolectomy for stage III colon cancer. She has recovered nicely from her procedure although she still has some chronic diarrhea. She moves her bowels about 2 times per day and takes Imodium when necessary which helps with her symptoms. She denies any abdominal pains but has had a 4 pound weight loss since her last visit. She denies any blood in the stools or melena.  Her CEA levels have been normal  Review of Systems     Objective:   Physical Exam No acute distress and nontoxic-appearing Abdomen is soft nontender exam is    Assessment:     History of stage III colon cancer She seems to be doing very well from her procedure and has no evidence of recurrence. Her last CEA level was normal. She recently had a colonoscopy which was normal as well and no further colonoscopy was recommended for her. She denies any blood in the stools or melena or other signs of recurrence and other than her diarrhea, has no complaints.     Plan:     I think it's reasonable to continue following her CEA levels and I do not think that we need to follow her with any CT scans or further colonoscopies given her age. If she develops any blood in the stools or melena or any abdominal symptoms or if her CEA levels are rising then I would recommend imaging followup. She is going to see her oncologist in another 2 months, she can follow up with me in one year or sooner if any symptoms develop.

## 2012-10-04 ENCOUNTER — Other Ambulatory Visit: Payer: Self-pay | Admitting: Family Medicine

## 2012-10-04 DIAGNOSIS — M810 Age-related osteoporosis without current pathological fracture: Secondary | ICD-10-CM

## 2012-10-13 ENCOUNTER — Ambulatory Visit
Admission: RE | Admit: 2012-10-13 | Discharge: 2012-10-13 | Disposition: A | Discharge status: Home / Self Care | Payer: Medicare Other | Source: Ambulatory Visit | Attending: Family Medicine | Admitting: Family Medicine

## 2012-10-13 DIAGNOSIS — M810 Age-related osteoporosis without current pathological fracture: Secondary | ICD-10-CM

## 2012-11-14 ENCOUNTER — Ambulatory Visit (HOSPITAL_BASED_OUTPATIENT_CLINIC_OR_DEPARTMENT_OTHER): Payer: Medicare Other | Admitting: Oncology

## 2012-11-14 ENCOUNTER — Telehealth: Payer: Self-pay | Admitting: Oncology

## 2012-11-14 ENCOUNTER — Other Ambulatory Visit (HOSPITAL_BASED_OUTPATIENT_CLINIC_OR_DEPARTMENT_OTHER): Payer: Medicare Other | Admitting: Lab

## 2012-11-14 VITALS — BP 147/89 | HR 73 | Temp 97.5°F | Resp 18 | Ht 61.0 in | Wt 110.8 lb

## 2012-11-14 DIAGNOSIS — C189 Malignant neoplasm of colon, unspecified: Secondary | ICD-10-CM

## 2012-11-14 DIAGNOSIS — C180 Malignant neoplasm of cecum: Secondary | ICD-10-CM

## 2012-11-14 LAB — CEA: CEA: 3.7 ng/mL (ref 0.0–5.0)

## 2012-11-14 NOTE — Progress Notes (Signed)
   [facility]    OFFICE PROGRESS NOTE   INTERVAL HISTORY:   She returns as scheduled. She complains of "gas "after eating. Frequent bowel movements are relieved when she takes Imodium. She has developed dark spots at the left side of her face.  Susan Reed underwent a negative colonoscopy by Dr. Arlyce Reed on 08/08/2012.  Objective:  Vital signs in last 24 hours:  Blood pressure 147/89, pulse 73, temperature 97.5 F (36.4 C), temperature source Oral, resp. rate 18, height 5\' 1"  (1.549 m), weight 110 lb 12.8 oz (50.259 kg).    HEENT: Neck without mass Lymphatics: No cervical, supraclavicular, axillary, or inguinal nodes Resp: Lungs clear bilaterally Cardio: Regular rate and rhythm GI: No hepatosplenomegaly, nontender, no mass Vascular: No leg edema  Skin: Multiple areas of Brown/tan hyperpigmentation over the face bilaterally and neck    Medications: I have reviewed the patient's current medications.  Assessment/Plan: 1. Stage IIIc (T4 N2) poorly differentiated adenocarcinoma of the cecum status post right colectomy 06/24/2011. She began adjuvant Xeloda chemotherapy on 07/26/2011. She completed the eighth and final cycle beginning 12/21/2011. 2. History of Anemia. Question if related to GI bleeding at presentation and then chemotherapy. 3. COPD. 4. Family history multiple cancers. 5. History of Anorexia/weight loss.  now off of Megace. 6. Report of balance difficulty and numbness in the feet when here 11/24/2011. Vtamin B 12 level in low normal range on 12/20/2011. Repeat level today was normal on 01/18/2012. 7. History of hand-foot syndrome secondary to Xeloda. Resolved. 8. History of vertigo. 9. Depression-she was started on a trial of Remeron following office visit 02/21/2012 with significant improvement. 10. Mildly elevated CEA July and August 2013-? Etiology. The CEA returned normal in December of 2013.? related to chronic bronchitis or other  inflammation.  Disposition:  She remains in clinical remission from colon cancer. We will followup on the CEA from today. She will return for an office visit and CEA in 6 months.  I encouraged her to use the Imodium as needed. She says that she does not eat secondary to a fear of frequent bowel movements and gas discomfort. I recommended she followup with Dr. Arlyce Reed if her symptoms persist.   Thornton Papas, MD  11/14/2012  4:38 PM

## 2012-11-14 NOTE — Telephone Encounter (Signed)
gv and printed appt sched and avs for pt  °

## 2012-11-17 ENCOUNTER — Telehealth: Payer: Self-pay | Admitting: *Deleted

## 2012-11-17 NOTE — Telephone Encounter (Signed)
Daughter notified CEA normal

## 2012-11-17 NOTE — Telephone Encounter (Signed)
Message copied by Wandalee Ferdinand on Fri Nov 17, 2012  2:00 PM ------      Message from: Thornton Papas B      Created: Wed Nov 15, 2012 10:12 PM       Please call daughter, cea is normal ------

## 2013-01-04 ENCOUNTER — Other Ambulatory Visit: Payer: Self-pay | Admitting: Oncology

## 2013-01-04 DIAGNOSIS — C182 Malignant neoplasm of ascending colon: Secondary | ICD-10-CM

## 2013-05-10 ENCOUNTER — Telehealth: Payer: Self-pay | Admitting: Oncology

## 2013-05-10 ENCOUNTER — Other Ambulatory Visit (HOSPITAL_BASED_OUTPATIENT_CLINIC_OR_DEPARTMENT_OTHER): Payer: Medicare Other

## 2013-05-10 ENCOUNTER — Ambulatory Visit (HOSPITAL_BASED_OUTPATIENT_CLINIC_OR_DEPARTMENT_OTHER): Payer: Medicare Other | Admitting: Nurse Practitioner

## 2013-05-10 VITALS — BP 168/77 | HR 69 | Temp 97.1°F | Resp 18 | Ht 61.0 in | Wt 109.3 lb

## 2013-05-10 DIAGNOSIS — Z862 Personal history of diseases of the blood and blood-forming organs and certain disorders involving the immune mechanism: Secondary | ICD-10-CM

## 2013-05-10 DIAGNOSIS — C180 Malignant neoplasm of cecum: Secondary | ICD-10-CM

## 2013-05-10 DIAGNOSIS — F329 Major depressive disorder, single episode, unspecified: Secondary | ICD-10-CM

## 2013-05-10 DIAGNOSIS — C189 Malignant neoplasm of colon, unspecified: Secondary | ICD-10-CM

## 2013-05-10 MED ORDER — MIRTAZAPINE 7.5 MG PO TABS: 7.5000 mg | ORAL_TABLET | Freq: Every day | ORAL | Status: DC

## 2013-05-10 NOTE — Progress Notes (Signed)
OFFICE PROGRESS NOTE  Interval history:  [name] returns as scheduled for followup of colon cancer. She feels well. She remains very active. She continues to have frequent bowel movements in the morning hours. She has "gas" after eating. She denies abdominal pain. No bloody or black stools. No nausea or vomiting. Appetite overall is good.   Objective: Blood pressure 168/77, pulse 69, temperature 97.1 F (36.2 C), temperature source Oral, resp. rate 18, height 5\' 1"  (1.549 m), weight 109 lb 4.8 oz (49.578 kg).  No thrush or ulcerations. No palpable cervical, supraclavicular, axillary or inguinal lymph nodes. Lungs are clear. No wheezes or rales. Regular cardiac rhythm. Abdomen soft and nontender. No organomegaly. No mass. No leg edema.  Lab Results: Lab Results  Component Value Date   WBC 5.7 01/18/2012   HGB 12.1 01/18/2012   HCT 35.7 01/18/2012   MCV 109.2* 01/18/2012   PLT 156 01/18/2012    Chemistry:    Chemistry      Component Value Date/Time   NA 140 12/20/2011 1333   K 3.5 12/20/2011 1333   CL 106 12/20/2011 1333   CO2 23 12/20/2011 1333   BUN 15 12/20/2011 1333   CREATININE 0.69 12/20/2011 1333      Component Value Date/Time   CALCIUM 9.1 12/20/2011 1333   ALKPHOS 48 12/20/2011 1333   AST 14 12/20/2011 1333   ALT 11 12/20/2011 1333   BILITOT 0.7 12/20/2011 1333       Studies/Results: No results found.  Medications: I have reviewed the patient's current medications.  Assessment/Plan:  1. Stage IIIc (T4 N2) poorly differentiated adenocarcinoma of the cecum status post right colectomy 06/24/2011. She began adjuvant Xeloda chemotherapy on 07/26/2011. She completed the eighth and final cycle beginning 12/21/2011. 2. History of Anemia. Question if related to GI bleeding at presentation and then chemotherapy. 3. COPD. 4. Family history multiple cancers. 5. History of Anorexia/weight loss. now off of Megace. 6. Report of balance difficulty and numbness in the feet when here  11/24/2011. Vtamin B 12 level in low normal range on 12/20/2011. Repeat level today was normal on 01/18/2012. 7. History of hand-foot syndrome secondary to Xeloda. Resolved. 8. History of vertigo. 9. Depression-she was started on a trial of Remeron following office visit 02/21/2012 with significant improvement. She is currently taking Remeron 7.5 mg daily. 10. Mildly elevated CEA July and August 2013-? Etiology. The CEA returned normal in December of 2013.? related to chronic bronchitis or other inflammation.  Disposition-she remains in clinical remission from colon cancer. We will followup on the CEA from today. She will return for a followup visit and CEA in 6 months. She will contact the office in the interim with any problems.  Plan reviewed with Dr. Truett Perna.  Lonna Cobb ANP/GNP-BC

## 2013-05-10 NOTE — Telephone Encounter (Signed)
gv and printed appt sched and avs foro pt for June 2015

## 2013-05-11 ENCOUNTER — Telehealth: Payer: Self-pay | Admitting: *Deleted

## 2013-05-11 LAB — CEA: CEA: 2.7 ng/mL (ref 0.0–5.0)

## 2013-05-11 NOTE — Telephone Encounter (Signed)
Called and informed patient of normal cea.  Per Dr. Sherrill.  Patient verbalized understanding.  

## 2013-05-11 NOTE — Telephone Encounter (Signed)
Message copied by Raphael Gibney on Fri May 11, 2013 11:04 AM ------      Message from: Ladene Artist      Created: Fri May 11, 2013 11:02 AM       Please call patient, cea is normal ------

## 2013-05-28 ENCOUNTER — Other Ambulatory Visit: Payer: Self-pay | Admitting: Oncology

## 2013-05-28 DIAGNOSIS — C189 Malignant neoplasm of colon, unspecified: Secondary | ICD-10-CM

## 2013-05-28 NOTE — Telephone Encounter (Signed)
7.5 mg Script printed 05-10-2013, not available for use

## 2013-07-13 IMAGING — CR DG CHEST 2V
2 series · 2 of 2 positions shown · non-contrast
Comparison: None.

CLINICAL DATA: Preoperative evaluation.  History of COPD,
hypertension, and chronic bronchitis.

CHEST - 2 VIEW

[w chest pa]
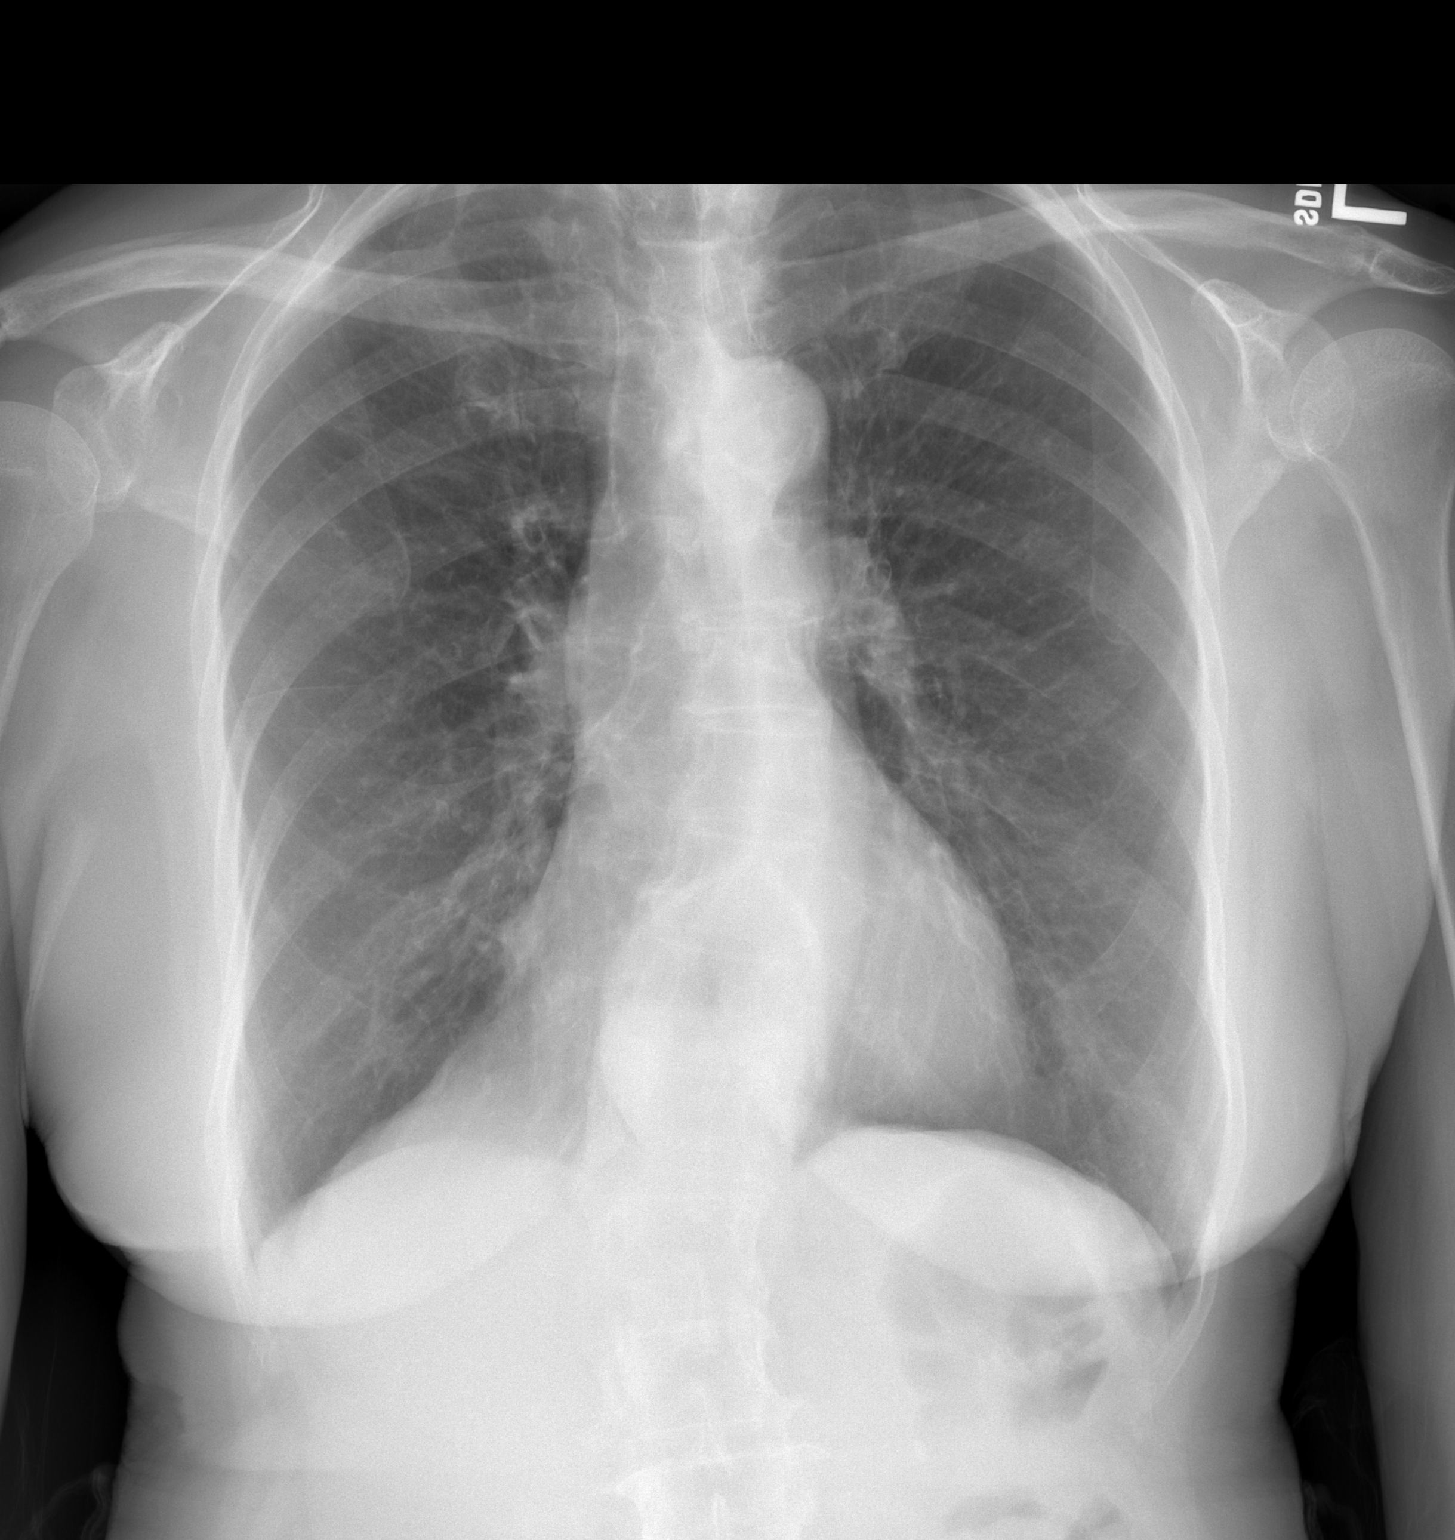

[w chest lat]
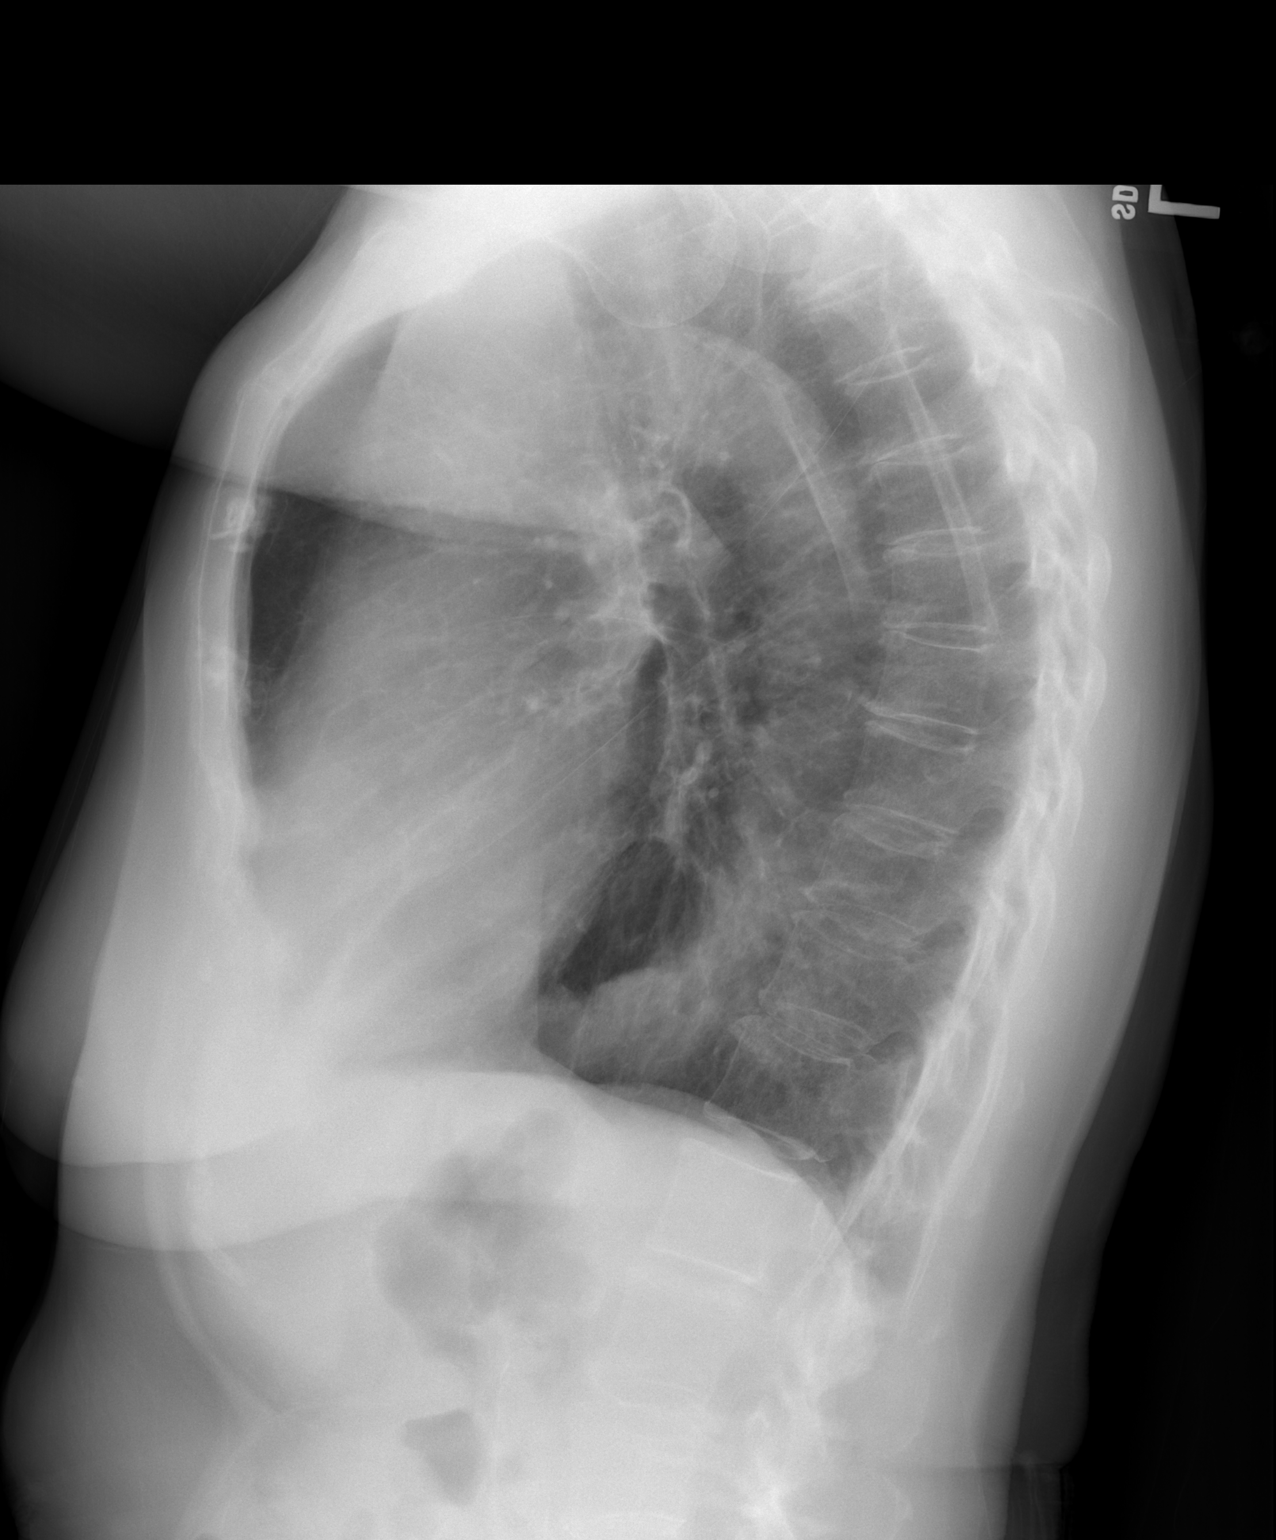

[2 of 2 positions shown; findings below may reference images not displayed]

FINDINGS: Cardiac silhouette is upper limits of normal size.
Ectasia, tortuosity and calcification of thoracic aorta are seen.
Moderate sized hiatal hernia is present in the retrocardiac region.
There is overall hyperinflation configuration with slight
flattening of low-lying diaphragm.  No pulmonary edema, pneumonia,
or pleural effusion is evident.  No nodules are seen.  Noncalcific
apical pleural thickening and fibrotic change is seen.  There is
osteopenic appearance of bones.  Changes of degenerative disc
disease and degenerative spondylosis are present.
IMPRESSION: Slight hyperinflation configuration with evidence of pulmonary
edema, pneumonia, or pleural effusion.  No acute abnormality is
seen.  Chronic findings are described above.

## 2013-07-13 IMAGING — US US PELVIS COMPLETE
1 of 2 series · 12 of 25 positions shown · non-contrast
Comparison: None.
COMPARISON: None.

<!--  IDXRADR:ADDEND:BEGIN -->Addendum Begins
<!--  IDXRADR:ADDEND:INNER_BEGIN -->***ADDENDUM*** CREATED: 06/22/2011 [DATE]

Transabdominal views of the uterus demonstrate a separate laterally
situated right adnexal mass measuring 6.2 x 3.7 x 4.6 cm which
would correlate with the CT findings corresponding with a cecal
mass. This is not well seen transvaginally.
CLINICAL DATA: Postmenopausal patient with uterine mass seen on CT.
The same CT also showed findings consistent with a cecal mass
worrisome for adenocarcinoma
TRANSABDOMINAL AND TRANSVAGINAL ULTRASOUND OF PELVIS
TECHNIQUE: Both transabdominal and transvaginal ultrasound
examinations of the pelvis were performed. Transabdominal technique
was performed for global imaging of the pelvis including uterus,
ovaries, adnexal regions, and pelvic cul-de-sac.

[Series 1: us transvaginal non-ob · 101 acquisitions, 12 frames shown]
[im 5/101]
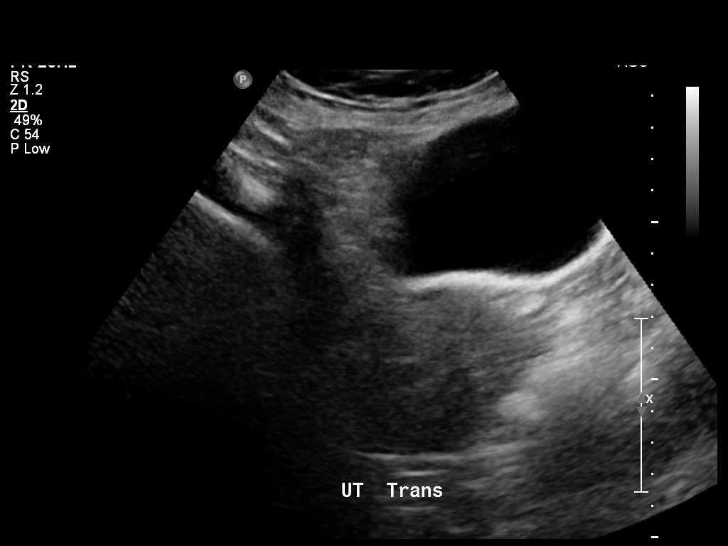
[im 14/101]
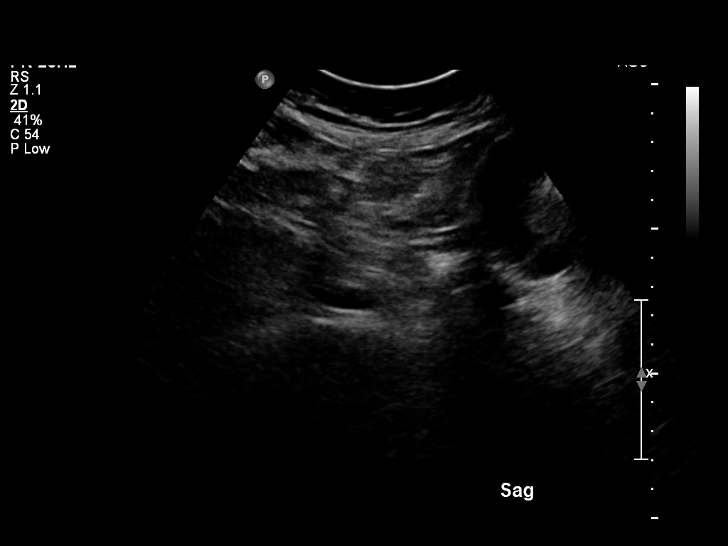
[im 22/101]
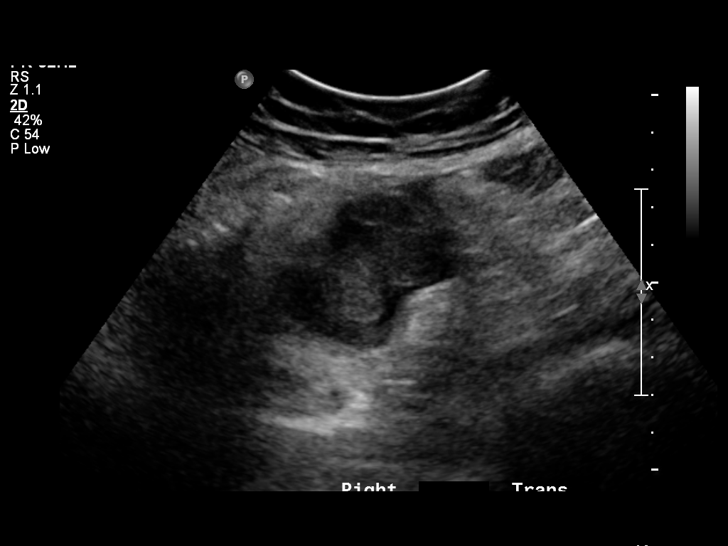
[im 31/101]
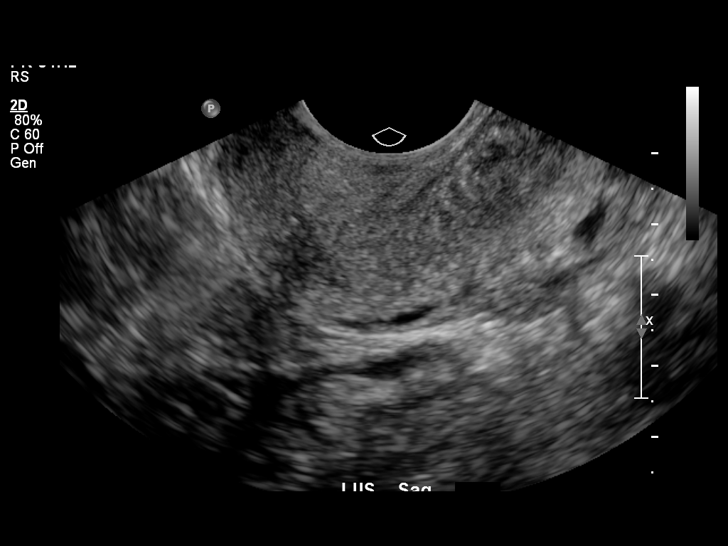
[im 40/101]
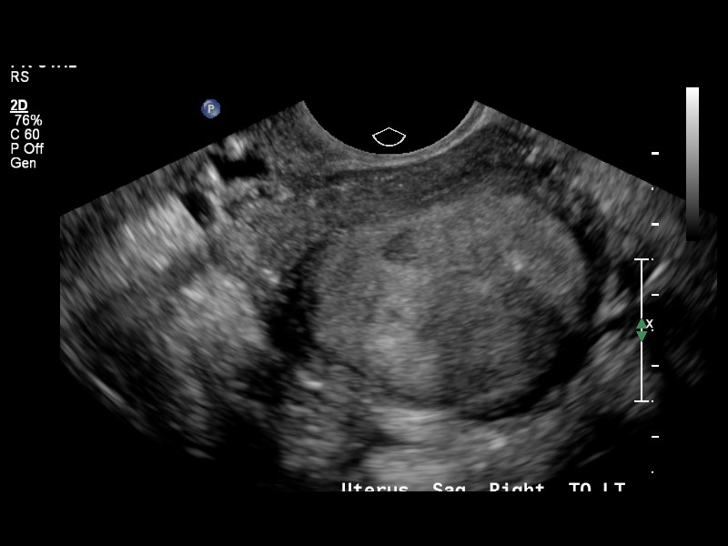
[im 48/101]
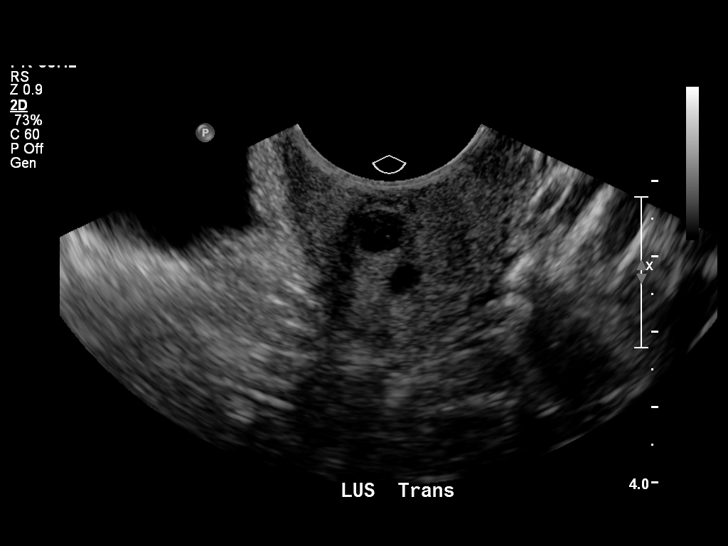
[im 57/101]
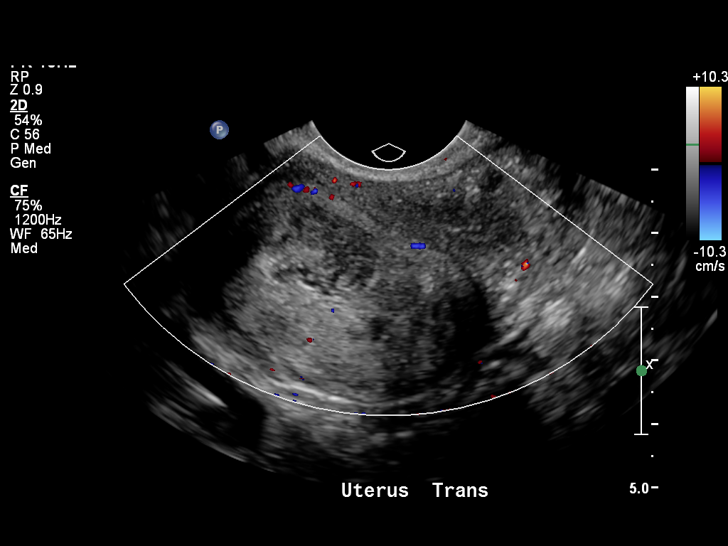
[im 66/101]
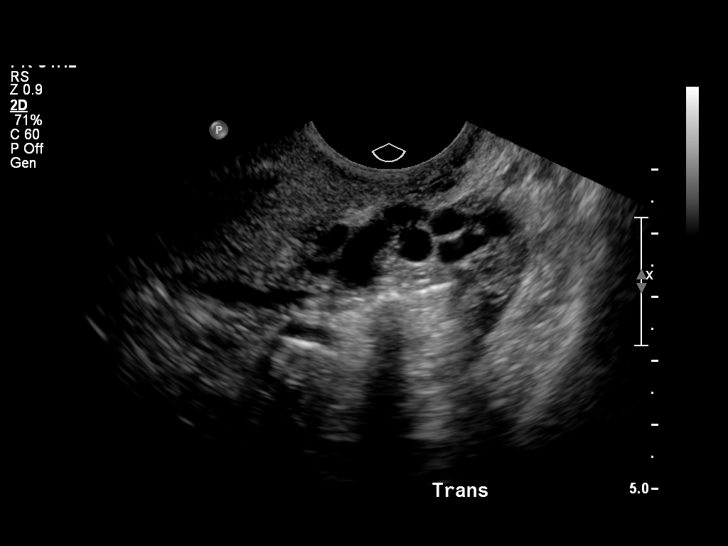
[im 74/101]
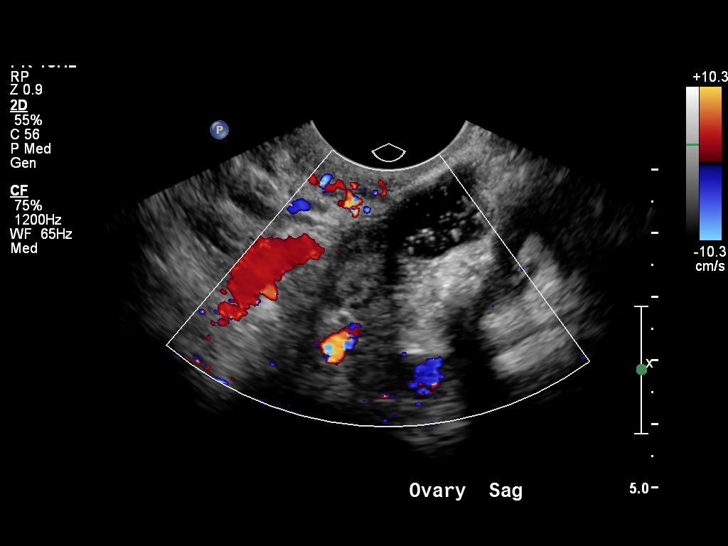
[im 83/101]
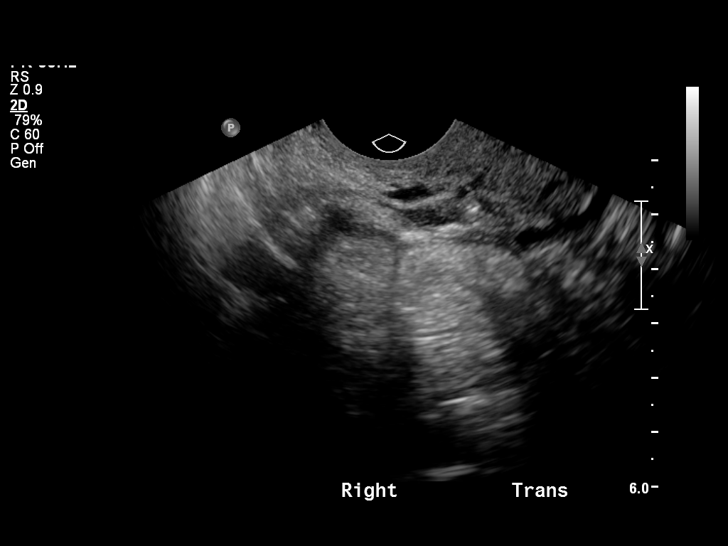
[im 92/101]
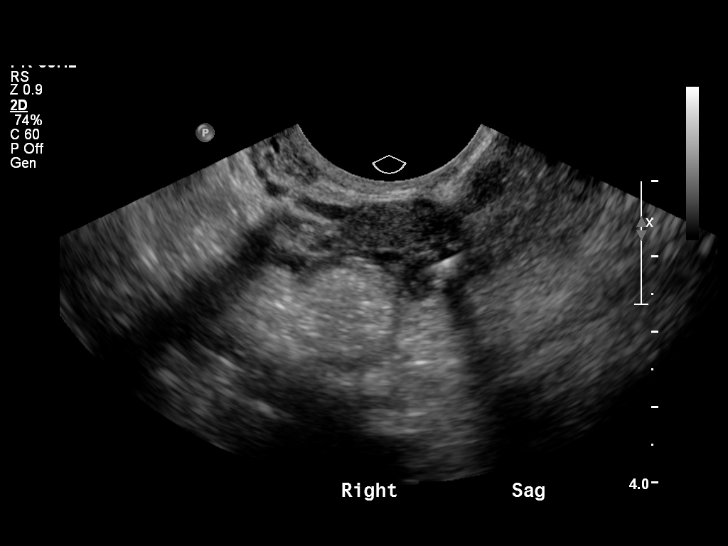
[im 101/101]
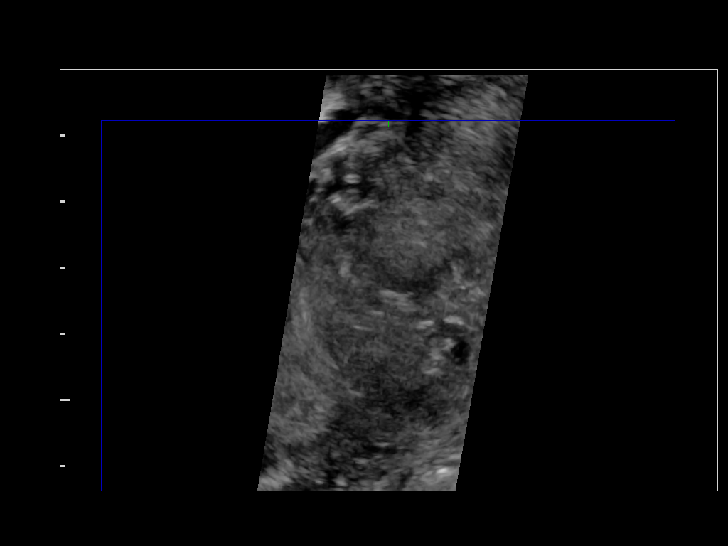

[12 of 25 positions shown; findings below may reference images not displayed]

It was necessary to proceed with endovaginal exam following the
transabdominal exam to visualize the endometrium and adnexa.
FINDINGS: Uterus: Is retroverted and demonstrates a sagittal length of
cm, depth of 2.4 cm in width of 5.6 cm. A soft tissue mass is
identified which appears contiguous with the right lateral
myometrium measuring 4.4 x 4.2 x 2.8 cm and most compatible with a
pedunculated fibroid.  A second fibroid is identified emanating off
of the left anterior midbody with a partial subserosal component
measuring 1.1 x 0.6 x 1.1 cm.

Endometrium: A small amount of fluid is identified within the
endometrial canal and this is physiologic in degree in the of
postmenopausal patient.  A single layer endometrial measurement of
7 mm is seen with no areas of focal thickening or heterogeneity.

Right ovary:  Normal postmenopausal appearance measuring 1.1 x
x 8.7 cm

Left ovary: Measures 1.5 x 0.9 x 0.9 cm and has a normal
postmenopausal appearance.

Other findings: No free fluid
IMPRESSION: Findings compatible with a large subserosal fibroid and retroverted
uterus.  This   may account for the appearance on recent CT.  The
CT is not available for comparison.

Normal postmenopausal endometrial lining and ovaries..
<!--  IDXRADR:ADDEND:INNER_END -->Addendum Ends
<!--  IDXRADR:ADDEND:END -->*RADIOLOGY REPORT*
It was necessary to proceed with endovaginal exam following the
transabdominal exam to visualize the endometrium and adnexa.
FINDINGS: Uterus: Is retroverted and demonstrates a sagittal length of
cm, depth of 2.4 cm in width of 5.6 cm. A soft tissue mass is
identified which appears contiguous with the right lateral
myometrium measuring 4.4 x 4.2 x 2.8 cm and most compatible with a
pedunculated fibroid.  A second fibroid is identified emanating off
of the left anterior midbody with a partial subserosal component
measuring 1.1 x 0.6 x 1.1 cm.

Endometrium: A small amount of fluid is identified within the
endometrial canal and this is physiologic in degree in the of
postmenopausal patient.  A single layer endometrial measurement of
7 mm is seen with no areas of focal thickening or heterogeneity.

Right ovary:  Normal postmenopausal appearance measuring 1.1 x
x 8.7 cm

Left ovary: Measures 1.5 x 0.9 x 0.9 cm and has a normal
postmenopausal appearance.

Other findings: No free fluid
IMPRESSION: Findings compatible with a large subserosal fibroid and retroverted
uterus.  This   may account for the appearance on recent CT.  The
CT is not available for comparison.

Normal postmenopausal endometrial lining and ovaries..

## 2013-09-20 ENCOUNTER — Ambulatory Visit (INDEPENDENT_AMBULATORY_CARE_PROVIDER_SITE_OTHER): Payer: Medicare Other | Admitting: General Surgery

## 2013-10-05 ENCOUNTER — Encounter (INDEPENDENT_AMBULATORY_CARE_PROVIDER_SITE_OTHER): Payer: Self-pay | Admitting: General Surgery

## 2013-10-05 ENCOUNTER — Ambulatory Visit (INDEPENDENT_AMBULATORY_CARE_PROVIDER_SITE_OTHER): Payer: Commercial Managed Care - HMO | Admitting: General Surgery

## 2013-10-05 VITALS — BP 144/70 | HR 68 | Temp 97.9°F | Resp 12 | Ht 61.0 in | Wt 112.0 lb

## 2013-10-05 DIAGNOSIS — C189 Malignant neoplasm of colon, unspecified: Secondary | ICD-10-CM

## 2013-10-05 NOTE — Progress Notes (Signed)
HPI  This patient follows up 2 years status post lap right hemicolectomy for stage III colon cancer. She has recovered nicely from her procedure although she still has some chronic diarrhea. She moves her bowels about 2 times per day and takes Imodium daily. She denies any abdominal pains or weight loss. She denies any blood in the stools or melena. Her CEA levels have been normal except for one mild elevation.  Objective:   Physical Exam  Filed Vitals:   10/05/13 1607  BP: 144/70  Pulse: 68  Temp: 97.9 F (36.6 C)  Resp: 12   No acute distress and nontoxic-appearing  Abdomen is soft nontender, no hernia  Assessment:   History of stage IIIc colon cancer  She seems to be doing very well from her procedure and has no evidence of recurrence. Her last CEA level was normal. She had a colonoscopy which was normal as well and no further colonoscopy was recommended for her. She denies any blood in the stools or melena or other signs of recurrence and other than her diarrhea, has no complaints.  Plan:   I think it's reasonable to continue following her CEA levels and I do not think that we need to follow her with any CT scans or further colonoscopies given her age. If she develops any blood in the stools or melena or any abdominal symptoms or if her CEA levels are rising then I would recommend imaging followup. She is going to see her oncologist in another 2 months, she can follow up with me in one year or sooner if any symptoms develop.

## 2013-10-05 NOTE — Patient Instructions (Signed)
Call the office if you develop any new, persistent GI symptoms.  Otherwise, follow up in 1 year.

## 2013-10-07 ENCOUNTER — Other Ambulatory Visit: Payer: Self-pay | Admitting: Oncology

## 2013-10-07 DIAGNOSIS — C189 Malignant neoplasm of colon, unspecified: Secondary | ICD-10-CM

## 2013-11-03 ENCOUNTER — Telehealth: Payer: Self-pay | Admitting: Oncology

## 2013-11-03 NOTE — Telephone Encounter (Signed)
PAL - moved 6/18 appt to AM. s/w pt she is aware.

## 2013-11-05 ENCOUNTER — Telehealth: Payer: Self-pay | Admitting: Oncology

## 2013-11-05 NOTE — Telephone Encounter (Signed)
s.w.l pt and r/s appt per pt request..she did not have a ride and needed later time...done...r/s with lisa

## 2013-11-08 ENCOUNTER — Ambulatory Visit (HOSPITAL_BASED_OUTPATIENT_CLINIC_OR_DEPARTMENT_OTHER): Payer: Medicare HMO | Admitting: Nurse Practitioner

## 2013-11-08 ENCOUNTER — Telehealth: Payer: Self-pay | Admitting: Oncology

## 2013-11-08 ENCOUNTER — Other Ambulatory Visit (HOSPITAL_BASED_OUTPATIENT_CLINIC_OR_DEPARTMENT_OTHER): Payer: Medicare HMO

## 2013-11-08 ENCOUNTER — Ambulatory Visit: Payer: Medicare Other | Admitting: Oncology

## 2013-11-08 ENCOUNTER — Other Ambulatory Visit: Payer: Medicare Other

## 2013-11-08 VITALS — BP 158/63 | HR 63 | Temp 98.0°F | Resp 18 | Ht 61.0 in | Wt 112.5 lb

## 2013-11-08 DIAGNOSIS — Z862 Personal history of diseases of the blood and blood-forming organs and certain disorders involving the immune mechanism: Secondary | ICD-10-CM

## 2013-11-08 DIAGNOSIS — C180 Malignant neoplasm of cecum: Secondary | ICD-10-CM

## 2013-11-08 DIAGNOSIS — F3289 Other specified depressive episodes: Secondary | ICD-10-CM

## 2013-11-08 DIAGNOSIS — C189 Malignant neoplasm of colon, unspecified: Secondary | ICD-10-CM

## 2013-11-08 DIAGNOSIS — F329 Major depressive disorder, single episode, unspecified: Secondary | ICD-10-CM

## 2013-11-08 NOTE — Telephone Encounter (Signed)
Gave pt appt for lab and Md for december 2015

## 2013-11-08 NOTE — Progress Notes (Signed)
  [facility] OFFICE PROGRESS NOTE   Diagnosis:  Colon cancer.  INTERVAL HISTORY:   Ms. Boehning returns as scheduled. She feels well. Bowel habits are unchanged. She continues to have frequent bowel movements in the morning hours. No abdominal pain. No bloody or black stools. She denies nausea/vomiting. Appetite is unchanged. She reports her weight is stable. She is very active. No shortness of breath. She coughs periodically.   Objective:  Vital signs in last 24 hours:  Blood pressure 158/63, pulse 63, temperature 98 F (36.7 C), temperature source Oral, resp. rate 18, height 5\' 1"  (1.549 m), weight 112 lb 8 oz (51.03 kg), SpO2 100.00%.    HEENT: No thrush or ulcerations. Lymphatics: No palpable cervical, supraclavicular, axillary or inguinal lymph nodes. Resp: Lungs clear. Cardio: Regular cardiac rhythm. GI: Abdomen soft and nontender. No organomegaly. Vascular: No leg edema. Calves soft and nontender.  Lab Results:  Lab Results  Component Value Date   WBC 5.7 01/18/2012   HGB 12.1 01/18/2012   HCT 35.7 01/18/2012   MCV 109.2* 01/18/2012   PLT 156 01/18/2012   NEUTROABS 2.5 01/18/2012    Imaging:  No results found.  Medications: I have reviewed the patient's current medications.  Assessment/Plan: 1. Stage IIIc (T4 N2) poorly differentiated adenocarcinoma of the cecum status post right colectomy 06/24/2011. She began adjuvant Xeloda chemotherapy on 07/26/2011. She completed the eighth and final cycle beginning 12/21/2011. 2. History of anemia. Question if related to GI bleeding at presentation and then chemotherapy. 3. COPD. 4. Family history multiple cancers. 5. History of anorexia/weight loss.  6. Report of balance difficulty and numbness in the feet when here 11/24/2011. Vtamin B 12 level in low normal range on 12/20/2011. Repeat level today was normal on 01/18/2012. 7. History of hand-foot syndrome secondary to Xeloda. Resolved. 8. History of  vertigo. 9. Depression-she was started on a trial of Remeron following office visit 02/21/2012 with significant improvement. She is currently taking Remeron 7.5 mg daily. 10. Mildly elevated CEA July and August 2013-? Etiology. The CEA returned normal in December of 2013. ? related to chronic bronchitis or other inflammation.   Disposition: Ms. Diven appears stable. She remains in clinical remission from colon cancer. We will followup on the CEA from today. She will return for a followup visit and CEA in 6 months. She will contact the office in the interim with any problems.  She would like to discontinue Remeron. Current dose is 7.5 mg daily. I discussed the need for a taper with our pharmacy with recommendations for 7.5 mg every other day for one week and then discontinue.    Ned Card ANP/GNP-BC   11/08/2013  12:55 PM

## 2013-11-09 ENCOUNTER — Telehealth: Payer: Self-pay | Admitting: *Deleted

## 2013-11-09 LAB — CEA: CEA: 3 ng/mL (ref 0.0–5.0)

## 2013-11-09 NOTE — Telephone Encounter (Signed)
Message copied by Norma Fredrickson on Fri Nov 09, 2013  9:17 AM ------      Message from: Owens Shark      Created: Fri Nov 09, 2013  9:04 AM       Please let patient and daughter know CEA is normal.       ----- Message -----         From: [facility]         Sent: 11/09/2013   4:48 AM           To: Owens Shark, NP                   ------

## 2013-11-09 NOTE — Telephone Encounter (Signed)
Called and informed patient of normal cea and left message with daughter ([place]) to call back.  Per Elby Showers. Thomas. Patient verbalized understanding.

## 2013-11-16 ENCOUNTER — Telehealth: Payer: Self-pay | Admitting: *Deleted

## 2013-11-16 NOTE — Telephone Encounter (Signed)
Received voicemail from patient wanting to speak to Dr Gearldine Shown RN.  Attempted to call back, no answer, left message asking for a call back.  SLJ

## 2013-11-20 ENCOUNTER — Other Ambulatory Visit: Payer: Self-pay | Admitting: *Deleted

## 2013-11-20 DIAGNOSIS — C189 Malignant neoplasm of colon, unspecified: Secondary | ICD-10-CM

## 2013-11-20 MED ORDER — MIRTAZAPINE 7.5 MG PO TABS
ORAL_TABLET | ORAL | Status: DC
Start: 2013-11-20 — End: 2014-08-02

## 2013-11-20 NOTE — Telephone Encounter (Signed)
Has decided that she needs to stay on the Remeron and requests refill.

## 2014-02-26 ENCOUNTER — Telehealth: Payer: Self-pay | Admitting: Oncology

## 2014-02-26 NOTE — Telephone Encounter (Signed)
Lvm advising appt chg from 12/22 to 12/28 due to md on call. Also mailed appt calendar.

## 2014-03-25 ENCOUNTER — Encounter (INDEPENDENT_AMBULATORY_CARE_PROVIDER_SITE_OTHER): Payer: Self-pay | Admitting: General Surgery

## 2014-05-10 ENCOUNTER — Ambulatory Visit: Payer: Medicare Other | Admitting: Oncology

## 2014-05-10 ENCOUNTER — Other Ambulatory Visit: Payer: Medicare Other

## 2014-05-14 ENCOUNTER — Other Ambulatory Visit: Payer: Medicare HMO

## 2014-05-14 ENCOUNTER — Ambulatory Visit: Payer: Self-pay | Admitting: Oncology

## 2014-05-20 ENCOUNTER — Ambulatory Visit (HOSPITAL_BASED_OUTPATIENT_CLINIC_OR_DEPARTMENT_OTHER): Payer: Medicare HMO | Admitting: Oncology

## 2014-05-20 ENCOUNTER — Other Ambulatory Visit (HOSPITAL_BASED_OUTPATIENT_CLINIC_OR_DEPARTMENT_OTHER): Payer: Medicare HMO

## 2014-05-20 ENCOUNTER — Telehealth: Payer: Self-pay | Admitting: Oncology

## 2014-05-20 VITALS — BP 142/52 | HR 62 | Temp 98.2°F | Resp 18 | Ht 61.0 in | Wt 113.3 lb

## 2014-05-20 DIAGNOSIS — C189 Malignant neoplasm of colon, unspecified: Secondary | ICD-10-CM

## 2014-05-20 DIAGNOSIS — Z85038 Personal history of other malignant neoplasm of large intestine: Secondary | ICD-10-CM

## 2014-05-20 DIAGNOSIS — F329 Major depressive disorder, single episode, unspecified: Secondary | ICD-10-CM

## 2014-05-20 NOTE — Telephone Encounter (Signed)
Pt confirmed labs/ov per 12/28 POF, gave pt AVS.... KJ °

## 2014-05-20 NOTE — Progress Notes (Signed)
  [facility] OFFICE PROGRESS NOTE   Diagnosis: Colon cancer   INTERVAL HISTORY:   Ms. Daus returns as scheduled. She feels well. She continues Remeron.. She continues to have several bowel movements each morning. Her daughter reports Ms. Markwood has been coughing for the past few days.  Objective:  Vital signs in last 24 hours:  Blood pressure 142/52, pulse 62, temperature 98.2 F (36.8 C), temperature source Oral, resp. rate 18, height 5\' 1"  (1.549 m), weight 113 lb 4.8 oz (51.393 kg), SpO2 98 %.    HEENT: Neck without mass Lymphatics: No cervical, supra-clavicular, axillary, or inguinal nodes Resp: End inspiratory rhonchi at the posterior bases bilaterally that cleared after several respirations, no respiratory distress Cardio: Regular rate and rhythm GI: No hepatosplenomegaly, nontender, no mass Vascular: No leg edema   Lab Results:    Lab Results  Component Value Date   CEA 3.0 11/08/2013   Medications: I have reviewed the patient's current medications.  Assessment/Plan: 1. Stage IIIc (T4 N2) poorly differentiated adenocarcinoma of the cecum status post right colectomy 06/24/2011. She began adjuvant Xeloda chemotherapy on 07/26/2011. She completed the eighth and final cycle beginning 12/21/2011. 2. History of anemia. Question if related to GI bleeding at presentation and then chemotherapy. 3. COPD. 4. Family history multiple cancers. 5. History of anorexia/weight loss.  6. Report of balance difficulty and numbness in the feet when here 11/24/2011. Vtamin B 12 level in low normal range on 12/20/2011. Repeat level today was normal on 01/18/2012. 7. History of hand-foot syndrome secondary to Xeloda. Resolved. 8. History of vertigo. 9. Depression-she was started on a trial of Remeron following office visit 02/21/2012 with significant improvement. She is currently taking Remeron 7.5 mg daily. 10. Mildly elevated CEA July and August 2013-? Etiology. The  CEA returned normal in December of 2013. ? related to chronic bronchitis or other inflammation.   Disposition:  Ms. Guinyard remains in clinical remission from colon cancer. We will follow-up on the CEA from today. She will return for an office visit and CEA in 6 months.  Betsy Coder, MD  05/20/2014  4:26 PM

## 2014-05-21 LAB — CEA: CEA: 2.9 ng/mL (ref 0.0–5.0)

## 2014-05-22 ENCOUNTER — Telehealth: Payer: Self-pay | Admitting: *Deleted

## 2014-05-22 NOTE — Telephone Encounter (Signed)
Per Dr. Benay Spice; notified pt that CEA is normal.  Pt very happy "Bless you"  Verbalized understanding and confirmed appt for 10/2014.

## 2014-05-22 NOTE — Telephone Encounter (Signed)
-----   Message from Ladell Pier, MD sent at 05/22/2014  1:34 PM EST ----- Please call patient, cea is normal

## 2014-08-02 ENCOUNTER — Other Ambulatory Visit: Payer: Self-pay | Admitting: Oncology

## 2014-08-23 ENCOUNTER — Other Ambulatory Visit: Payer: Self-pay | Admitting: Family Medicine

## 2014-08-23 DIAGNOSIS — M8580 Other specified disorders of bone density and structure, unspecified site: Secondary | ICD-10-CM

## 2014-10-18 ENCOUNTER — Ambulatory Visit
Admission: RE | Admit: 2014-10-18 | Discharge: 2014-10-18 | Disposition: A | Discharge status: Home / Self Care | Payer: Medicare HMO | Source: Ambulatory Visit | Attending: Family Medicine | Admitting: Family Medicine

## 2014-10-18 DIAGNOSIS — M8580 Other specified disorders of bone density and structure, unspecified site: Secondary | ICD-10-CM

## 2014-11-18 ENCOUNTER — Telehealth: Payer: Self-pay | Admitting: Oncology

## 2014-11-18 ENCOUNTER — Ambulatory Visit (HOSPITAL_BASED_OUTPATIENT_CLINIC_OR_DEPARTMENT_OTHER): Payer: Medicare HMO | Admitting: Nurse Practitioner

## 2014-11-18 ENCOUNTER — Other Ambulatory Visit (HOSPITAL_BASED_OUTPATIENT_CLINIC_OR_DEPARTMENT_OTHER): Payer: Medicare HMO

## 2014-11-18 VITALS — BP 154/52 | HR 67 | Temp 97.7°F | Resp 18 | Ht 61.0 in | Wt 108.0 lb

## 2014-11-18 DIAGNOSIS — Z85038 Personal history of other malignant neoplasm of large intestine: Secondary | ICD-10-CM

## 2014-11-18 DIAGNOSIS — F329 Major depressive disorder, single episode, unspecified: Secondary | ICD-10-CM

## 2014-11-18 DIAGNOSIS — C189 Malignant neoplasm of colon, unspecified: Secondary | ICD-10-CM

## 2014-11-18 DIAGNOSIS — R634 Abnormal weight loss: Secondary | ICD-10-CM

## 2014-11-18 NOTE — Progress Notes (Signed)
  [facility] OFFICE PROGRESS NOTE   Diagnosis: Colon cancer   INTERVAL HISTORY:   Susan Reed returns as scheduled. She feels well. No change in bowel habits. She continues to have several bowel movements each morning. No bloody or black stools. No abdominal pain. Appetite is unchanged. She has lost some weight recently. She attributes the weight loss to a recent tooth extraction.  Objective:  Vital signs in last 24 hours:  Blood pressure 154/52, pulse 67, temperature 97.7 F (36.5 C), temperature source Oral, resp. rate 18, height 5\' 1"  (1.549 m), weight 108 lb (48.988 kg), SpO2 98 %.    HEENT: No thrush or ulcers. Lymphatics: No palpable cervical, supra clavicular, axillary or inguinal lymph nodes. Resp: faint end inspiratory rhonchi at the left lung base. No respiratory distress. Cardio: Regular rate and rhythm. GI: Abdomen soft and nontender. No hepatomegaly. No mass. Vascular: No leg edema.    Lab Results:  Lab Results  Component Value Date   WBC 5.7 01/18/2012   HGB 12.1 01/18/2012   HCT 35.7 01/18/2012   MCV 109.2* 01/18/2012   PLT 156 01/18/2012   NEUTROABS 2.5 01/18/2012    Imaging:  No results found.  Medications: I have reviewed the patient's current medications.  Assessment/Plan: 1. Stage IIIc (T4 N2) poorly differentiated adenocarcinoma of the cecum status post right colectomy 06/24/2011. She began adjuvant Xeloda chemotherapy on 07/26/2011. She completed the eighth and final cycle beginning 12/21/2011. 2. History of anemia. Question if related to GI bleeding at presentation and then chemotherapy. 3. COPD. 4. Family history multiple cancers. 5. History of anorexia/weight loss.  6. Report of balance difficulty and numbness in the feet when here 11/24/2011. Vtamin B 12 level in low normal range on 12/20/2011. Repeat level today was normal on 01/18/2012. 7. History of hand-foot syndrome secondary to Xeloda. Resolved. 8. History of  vertigo. 9. Depression-she was started on a trial of Remeron following office visit 02/21/2012 with significant improvement. She is currently taking Remeron 7.5 mg daily. 10. Mildly elevated CEA July and August 2013-? Etiology. The CEA returned normal in December of 2013. ? related to chronic bronchitis or other inflammation.   Disposition: Susan Reed remains in clinical remission from colon cancer. We will follow-up on the CEA from today. She will return for an office visit and CEA in 6 months.   We discussed the decline in her weight over the past 6 months. She thinks the weight loss has occurred due to the recent dental extraction. She will continue to monitor her weight at home and contact us with further weight loss. We will ask the [place] dietitian to contact her with nutritional supplement recommendations.  Plan reviewed with Dr. Benay Spice.    Ned Card ANP/GNP-BC   11/18/2014  3:20 PM

## 2014-11-18 NOTE — Telephone Encounter (Signed)
Gave patient avs report and appointments for December 2016.

## 2014-11-19 ENCOUNTER — Telehealth: Payer: Self-pay | Admitting: *Deleted

## 2014-11-19 LAB — CEA: CEA: 2.2 ng/mL (ref 0.0–5.0)

## 2014-11-19 NOTE — Telephone Encounter (Signed)
-----   Message from Susan Pier, MD sent at 11/19/2014  1:46 PM EDT ----- Please call patient, cea is normal

## 2014-11-19 NOTE — Telephone Encounter (Signed)
Called and informed patient of normal lab results.  Per Dr. Benay Spice.  Patient verbalized understanding.

## 2015-01-01 ENCOUNTER — Telehealth: Payer: Self-pay

## 2015-01-01 NOTE — Telephone Encounter (Signed)
Pt called requesting to speak to Ned Card NP.

## 2015-01-01 NOTE — Telephone Encounter (Signed)
Pt is still losing weight. Pt weighs 105# on her home scale. She says she used to weigh 116#.  "Not to good an appetite". Food gives her bloating. Lactose intolerant. She does her own cooking. Lives by self. Pt never got a phone call from dietician after 6/27 visit. She weighed 108 on 6/27 visit.

## 2015-01-08 ENCOUNTER — Telehealth: Payer: Self-pay | Admitting: Oncology

## 2015-01-08 NOTE — Telephone Encounter (Signed)
S/w pt confirming Nut with BN, sent msg to BN confirming D/T... KJ

## 2015-01-14 ENCOUNTER — Encounter: Payer: Medicare HMO | Admitting: Nutrition

## 2015-01-22 ENCOUNTER — Other Ambulatory Visit: Payer: Self-pay | Admitting: Oncology

## 2015-01-30 ENCOUNTER — Ambulatory Visit: Payer: Medicare HMO | Admitting: Nutrition

## 2015-01-30 NOTE — Progress Notes (Signed)
81 year old female diagnosed with colon cancer.  She is a patient of Dr. Julieanne Manson.  Past medical history includes anemia, COPD, depression, hyperlipidemia, hypertension, hypothyroidism, and anxiety.  Medications include calcium, vitamin D, omega-3 fatty acids, Synthroid, Imodium, Remeron, multivitamin, probiotics vitamin A and D.  Labs were reviewed.  Height: 61 inches. Weight: 107.6 pounds September 8 Usual body weight: 113 pounds BMI: 28.33.  Patient complains of gas and bloating on a daily basis no matter what she eats. Patient avoids lactose. She eats very small portions and reports early satiety. Patient continues to peel raw fruits and vegetables and avoids gas-forming foods.   She no longer consumes oral nutrition supplements.  Nutrition diagnosis: Unintended weight loss related to inadequate oral intake as evidenced by approximately 5 pound weight loss.  Intervention:  Reviewed a list of foods patient states she does tolerate and encouraged her to consume small amounts of these foods every 2 hours. Recommended patient begin oral nutrition supplements.   Encouraged patient try 4 ounces of a clear liquid oral nutrition supplement every day until tolerance established. Also provided patient with Unjury protein powder. Recommended patient keep a food journal to determine foods tolerated or foods that seem to cause bloating and gas. Recommend referral to GI physician for evaluation.  Patient states she will call Dr. Deatra Ina for appointment.  Monitoring, evaluation, goals: Patient will work to increase oral intake to minimize further weight loss.  **Disclaimer: This note was dictated with voice recognition software. Similar sounding words can inadvertently be transcribed and this note may contain transcription errors which may not have been corrected upon publication of note.**

## 2015-03-04 ENCOUNTER — Other Ambulatory Visit: Payer: Self-pay | Admitting: *Deleted

## 2015-03-04 MED ORDER — MIRTAZAPINE 7.5 MG PO TABS: 7.5000 mg | ORAL_TABLET | Freq: Every day | ORAL | Status: DC

## 2015-04-09 ENCOUNTER — Telehealth: Payer: Self-pay | Admitting: Oncology

## 2015-04-09 NOTE — Telephone Encounter (Signed)
Du to [facility] 12/19 appointments moved to 06/03/15. Spoke with patient she is aware.

## 2015-04-10 ENCOUNTER — Telehealth: Payer: Self-pay | Admitting: Gastroenterology

## 2015-04-15 NOTE — Telephone Encounter (Signed)
ok 

## 2015-04-25 ENCOUNTER — Encounter: Payer: Self-pay | Admitting: Gastroenterology

## 2015-04-25 ENCOUNTER — Ambulatory Visit (INDEPENDENT_AMBULATORY_CARE_PROVIDER_SITE_OTHER): Payer: Medicare Other | Admitting: Gastroenterology

## 2015-04-25 VITALS — BP 144/70 | HR 96 | Ht 59.75 in | Wt 102.2 lb

## 2015-04-25 DIAGNOSIS — R140 Abdominal distension (gaseous): Secondary | ICD-10-CM | POA: Diagnosis not present

## 2015-04-25 NOTE — Patient Instructions (Signed)
Low-Fiber Diet Fiber is found in fruits, vegetables, and whole grains. A low-fiber diet restricts fibrous foods that are not digested in the small intestine. A diet containing about 10-15 grams of fiber per day is considered low fiber. Low-fiber diets may be used to:  Promote healing and rest the bowel during intestinal flare-ups.  Prevent blockage of a partially obstructed or narrowed gastrointestinal tract.  Reduce fecal weight and volume.  Slow the movement of feces. You may be on a low-fiber diet as a transitional diet following surgery, after an injury (trauma), or because of a short (acute) or lifelong (chronic) illness. Your health care provider will determine the length of time you need to stay on this diet.  WHAT DO I NEED TO KNOW ABOUT A LOW-FIBER DIET? Always check the fiber content on the packaging's Nutrition Facts label, especially on foods from the grains list. Ask your dietitian if you have questions about specific foods that are related to your condition, especially if the food is not listed below. In general, a low-fiber food will have less than 2 g of fiber. WHAT FOODS CAN I EAT? Grains All breads and crackers made with white flour. Sweet rolls, doughnuts, waffles, pancakes, [place] toast, bagels. Pretzels, Melba toast, zwieback. Well-cooked cereals, such as cornmeal, farina, or cream cereals. Dry cereals that do not contain whole grains, fruit, or nuts, such as refined corn, wheat, rice, and oat cereals. Potatoes prepared any way without skins, plain pastas and noodles, refined white rice. Use white flour for baking and making sauces. Use allowed list of grains for casseroles, dumplings, and puddings.  Vegetables Strained tomato and vegetable juices. Fresh lettuce, cucumber, spinach. Well-cooked (no skin or pulp) or canned vegetables, such as asparagus, bean sprouts, beets, carrots, green beans, mushrooms, potatoes, pumpkin, spinach, yellow squash, tomato sauce/puree, turnips,  yams, and zucchini. Keep servings limited to  cup.  Fruits All fruit juices except prune juice. Cooked or canned fruits without skin and seeds, such as applesauce, apricots, cherries, fruit cocktail, grapefruit, grapes, mandarin oranges, melons, peaches, pears, pineapple, and plums. Fresh fruits without skin, such as apricots, avocados, bananas, melons, pineapple, nectarines, and peaches. Keep servings limited to  cup or 1 piece.  Meat and Other Protein Sources Ground or well-cooked tender beef, ham, veal, lamb, pork, or poultry. Eggs, plain cheese. Fish, oysters, shrimp, lobster, and other seafood. Liver, organ meats. Smooth nut butters. Dairy All milk products and alternative dairy substitutes, such as soy, rice, almond, and coconut, not containing added whole nuts, seeds, or added fruit. Beverages Decaf coffee, fruit, and vegetable juices or smoothies (small amounts, with no pulp or skins, and with fruits from allowed list), sports drinks, herbal tea. Condiments Ketchup, mustard, vinegar, cream sauce, cheese sauce, cocoa powder. Spices in moderation, such as allspice, basil, bay leaves, celery powder or leaves, cinnamon, cumin powder, curry powder, ginger, mace, marjoram, onion or garlic powder, oregano, paprika, parsley flakes, ground pepper, rosemary, sage, savory, tarragon, thyme, and turmeric. Sweets and Desserts Plain cakes and cookies, pie made with allowed fruit, pudding, custard, cream pie. Gelatin, fruit, ice, sherbet, frozen ice pops. Ice cream, ice milk without nuts. Plain hard candy, honey, jelly, molasses, syrup, sugar, chocolate syrup, gumdrops, marshmallows. Limit overall sugar intake.  Fats and Oil Margarine, butter, cream, mayonnaise, salad oils, plain salad dressings made from allowed foods. Choose healthy fats such as olive oil, canola oil, and omega-3 fatty acids (such as found in salmon or tuna) when possible.  Other Bouillon, broth, or cream soups made  from allowed foods.  Any strained soup. Casseroles or mixed dishes made with allowed foods. The items listed above may not be a complete list of recommended foods or beverages. Contact your dietitian for more options.  WHAT FOODS ARE NOT RECOMMENDED? Grains All whole wheat and whole grain breads and crackers. Multigrains, rye, bran seeds, nuts, or coconut. Cereals containing whole grains, multigrains, bran, coconut, nuts, raisins. Cooked or dry oatmeal, steel-cut oats. Coarse wheat cereals, granola. Cereals advertised as high fiber. Potato skins. Whole grain pasta, wild or brown rice. Popcorn. Coconut flour. Bran, buckwheat, corn bread, multigrains, rye, wheat germ.  Vegetables Fresh, cooked or canned vegetables, such as artichokes, asparagus, beet greens, broccoli, Brussels sprouts, cabbage, celery, cauliflower, corn, eggplant, kale, legumes or beans, okra, peas, and tomatoes. Avoid large servings of any vegetables, especially raw vegetables.  Fruits Fresh fruits, such as apples with or without skin, berries, cherries, figs, grapes, grapefruit, guavas, kiwis, mangoes, oranges, papayas, pears, persimmons, pineapple, and pomegranate. Prune juice and juices with pulp, stewed or dried prunes. Dried fruits, dates, raisins. Fruit seeds or skins. Avoid large servings of all fresh fruits. Meats and Other Protein Sources Tough, fibrous meats with gristle. Chunky nut butter. Cheese made with seeds, nuts, or other foods not recommended. Nuts, seeds, legumes (beans, including baked beans), dried peas, beans, lentils.  Dairy Yogurt or cheese that contains nuts, seeds, or added fruit.  Beverages Fruit juices with high pulp, prune juice. Caffeinated coffee and teas.  Condiments Coconut, maple syrup, pickles, olives. Sweets and Desserts Desserts, cookies, or candies that contain nuts or coconut, chunky peanut butter, dried fruits. Jams, preserves with seeds, marmalade. Large amounts of sugar and sweets. Any other dessert made with  fruits from the not recommended list.  Other Soups made from vegetables that are not recommended or that contain other foods not recommended.  The items listed above may not be a complete list of foods and beverages to avoid. Contact your dietitian for more information.   This information is not intended to replace advice given to you by your health care provider. Make sure you discuss any questions you have with your health care provider.   Document Released: 10/30/2001 Document Revised: 05/15/2013 Document Reviewed: 04/02/2013 Elsevier Interactive Patient Education 2016 [employer].   Use Gas X As needed [employer] daily Use a Multivitamin daily

## 2015-04-25 NOTE — Progress Notes (Signed)
Susan Reed    [id number]    Jun 20, 1924  Primary Care Physician:ANDY,CAMILLE L, MD  Referring Physician: Leamon Arnt, MD [address] [place] [address], [place] [address]  Chief complaint:  Abdominal bloating  HPI: 81 year old female with history of colon cancer diagnosed in 2013 status post right hemicolectomy here with complaints of abdominal bloating and decreased appetite. She hears gurgling noise and is bloated after eating and is afraid to eat because of it, she also occasionally has pain in the right lower quadrant associated with bloating. Denies any constipation or diarrhea. No nausea or vomiting. Her weight is going down as she is not eating much due to lack of appetite. She has tried Culturelle with no improvement in symptoms.  Outpatient Encounter Prescriptions as of 04/25/2015  Medication Sig  . aspirin 81 MG tablet Take 81 mg by mouth daily with breakfast.   . Calcium Carbonate-Vit D-Min (CALTRATE PLUS PO) Take 1 tablet by mouth daily.   . Cholecalciferol (VITAMIN D-3 PO) Take 1,000 Units by mouth daily.   . cycloSPORINE (RESTASIS) 0.05 % ophthalmic emulsion Place 1 drop into both eyes 2 (two) times daily.   Marland Kitchen dicyclomine (BENTYL) 10 MG capsule Take 1 capsule by mouth 4 (four) times daily -  before meals and at bedtime.  . dorzolamide-timolol (COSOPT) 22.3-6.8 MG/ML ophthalmic solution Place 1 drop into both eyes 2 (two) times daily.   . fish oil-omega-3 fatty acids 1000 MG capsule Take 1 g by mouth daily.   Marland Kitchen levothyroxine (SYNTHROID, LEVOTHROID) 75 MCG tablet Take 75 mcg by mouth daily before breakfast.  . loperamide (IMODIUM) 2 MG capsule Take 2 mg by mouth as needed. Usually takes 1/2 tab prn  . mirtazapine (REMERON) 7.5 MG tablet Take 1 tablet (7.5 mg total) by mouth at bedtime.  . Multiple Vitamin (MULTIVITAMIN) capsule Take 1 capsule by mouth daily.  . Probiotic Product (PROBIOTIC DAILY PO) Take 1 capsule by mouth daily.  [facility]  ophthalmic ointment Apply to eye daily as needed.  . Vitamins A & D (VITAMIN A & D) 10000-400 UNITS CAPS    No facility-administered encounter medications on file as of 04/25/2015.    Allergies as of 04/25/2015 - Review Complete 04/25/2015  Allergen Reaction Noted  . Codeine Nausea And Vomiting 06/02/2011  . Lactose intolerance (gi)  07/21/2011    Past Medical History  Diagnosis Date  . Anemia   . COPD (chronic obstructive pulmonary disease) ([place])   . Hyperlipidemia   . Osteoporosis   . Hypertension   . Glaucoma   . Hypothyroidism   . Blood transfusion     2009  . Colon cancer ([place])     colon cancer   . Rectal bleeding   . Abdominal bloating   . Allergy 08/09/12    codeine and lactose  . Anxiety   . Depression   . Blood transfusion without reported diagnosis 2009    5 years ago for anemia  . Cataract     Past Surgical History  Procedure Laterality Date  . Tonsillectomy    . Right colectomy  2013  . Band hemorrhoidectomy      by Dr. Harlow Asa  . Cataract extraction w/ intraocular lens implant Right 2008  . Colon surgery      Family History  Problem Relation Age of Onset  . Heart disease Father   . Cancer Sister     pt unaware of what kind, but  knows it was female reproductive area  . Cancer Brother     lymphoma  . Colon cancer Neg Hx     Social History   Social History  . Marital Status: Widowed    Spouse Name: N/A  . Number of Children: 4  . Years of Education: N/A   Occupational History  . Retired     Social History Main Topics  . Smoking status: Never Smoker   . Smokeless tobacco: Never Used  . Alcohol Use: Yes     Comment: occ  . Drug Use: No  . Sexual Activity: Not on file   Other Topics Concern  . Not on file   Social History Narrative   Widow.  Originally from [place].  Lives in independent living.      Review of systems: Review of Systems  Constitutional: Negative for fever and chills.  HENT: Negative.   Eyes: Negative for  blurred vision.  Respiratory: Negative for cough, shortness of breath and wheezing.   Cardiovascular: Negative for chest pain and palpitations.  Gastrointestinal: as per HPI Genitourinary: Negative for dysuria, urgency, frequency and hematuria.  Musculoskeletal: Negative for myalgias, back pain and joint pain.  Skin: Negative for itching and rash.  Neurological: Negative for dizziness, tremors, focal weakness, seizures and loss of consciousness.  Endo/Heme/Allergies: Negative for environmental allergies.  Psychiatric/Behavioral: Negative for depression, suicidal ideas and hallucinations.  All other systems reviewed and are negative.   Physical Exam: Filed Vitals:   04/25/15 1333  BP: 144/70  Pulse: 96   Gen:      No acute distress HEENT:  EOMI, sclera anicteric Neck:     No masses; no thyromegaly Lungs:    Clear to auscultation bilaterally; normal respiratory effort CV:         Regular rate and rhythm; no murmurs Abd:      + bowel sounds; soft, non-tender; no palpable masses, no distension Ext:    No edema; adequate peripheral perfusion Skin:      Warm and dry; no rash Neuro: alert and oriented x 3 Psych: normal mood and affect  Data Reviewed:  Colonoscopy 2014: Normal   Assessment and Plan/Recommendations: 81 year old female here with complaints of generalized abdominal bloating associated with decreased appetite Advised patient to avoid fiber and eats small frequent meals Continue dicyclomine as needed and can take Gas-X as needed She will do a trial of probiotic align for 2 weeks Also advised patient to take multivitamins  If continues to have symptoms despite above-mentioned changes may consider rifaximin for 2 weeks to empirically treat for possible small intestinal bacterial overgrowth Return in 2 months

## 2015-05-12 ENCOUNTER — Other Ambulatory Visit: Payer: Medicare HMO

## 2015-05-12 ENCOUNTER — Ambulatory Visit: Payer: Medicare HMO | Admitting: Oncology

## 2015-05-16 ENCOUNTER — Other Ambulatory Visit: Payer: Self-pay | Admitting: *Deleted

## 2015-05-16 ENCOUNTER — Telehealth: Payer: Self-pay | Admitting: Oncology

## 2015-05-16 NOTE — Telephone Encounter (Signed)
per pof to sch pt appt-cld & spoke to pt and gave pt appt time & date °

## 2015-05-21 ENCOUNTER — Ambulatory Visit (HOSPITAL_BASED_OUTPATIENT_CLINIC_OR_DEPARTMENT_OTHER): Payer: Medicare Other | Admitting: Nurse Practitioner

## 2015-05-21 ENCOUNTER — Ambulatory Visit: Payer: Medicare Other | Admitting: Oncology

## 2015-05-21 ENCOUNTER — Telehealth: Payer: Self-pay | Admitting: *Deleted

## 2015-05-21 ENCOUNTER — Ambulatory Visit (HOSPITAL_BASED_OUTPATIENT_CLINIC_OR_DEPARTMENT_OTHER): Payer: Medicare Other

## 2015-05-21 VITALS — BP 130/59 | HR 89 | Temp 98.0°F | Resp 18 | Ht 59.75 in | Wt 104.1 lb

## 2015-05-21 DIAGNOSIS — E8809 Other disorders of plasma-protein metabolism, not elsewhere classified: Secondary | ICD-10-CM

## 2015-05-21 DIAGNOSIS — E876 Hypokalemia: Secondary | ICD-10-CM

## 2015-05-21 DIAGNOSIS — C189 Malignant neoplasm of colon, unspecified: Secondary | ICD-10-CM

## 2015-05-21 DIAGNOSIS — R1900 Intra-abdominal and pelvic swelling, mass and lump, unspecified site: Secondary | ICD-10-CM | POA: Diagnosis not present

## 2015-05-21 DIAGNOSIS — E46 Unspecified protein-calorie malnutrition: Secondary | ICD-10-CM | POA: Diagnosis not present

## 2015-05-21 LAB — COMPREHENSIVE METABOLIC PANEL
ALBUMIN: 2.8 g/dL — AB (ref 3.5–5.0)
ALT: <9 U/L (ref 0–55)
AST: 10 U/L (ref 5–34)
Alkaline Phosphatase: 81 U/L (ref 40–150)
Anion Gap: 11 mEq/L (ref 3–11)
BUN: 8.5 mg/dL (ref 7.0–26.0)
CO2: 24 meq/L (ref 22–29)
Calcium: 8.4 mg/dL (ref 8.4–10.4)
Chloride: 104 mEq/L (ref 98–109)
Creatinine: 0.6 mg/dL (ref 0.6–1.1)
EGFR: 81 mL/min/{1.73_m2} — AB (ref >90)
GLUCOSE: 93 mg/dL (ref 70–140)
SODIUM: 139 meq/L (ref 136–145)
TOTAL PROTEIN: 6.5 g/dL (ref 6.4–8.3)
Total Bilirubin: 0.53 mg/dL (ref 0.20–1.20)

## 2015-05-21 LAB — URINALYSIS, MICROSCOPIC - CHCC
BILIRUBIN (URINE): NEGATIVE
BLOOD: NEGATIVE
Bacteria, UA: NEGATIVE
Glucose: NEGATIVE mg/dL
KETONES: NEGATIVE mg/dL
Leukocyte Esterase: NEGATIVE
NITRITE: NEGATIVE
PH: 6.5 (ref 4.6–8.0)
Protein: <30 mg/dL
RBC / HPF: NEGATIVE (ref 0–2)
Specific Gravity, Urine: 1.01 (ref 1.003–1.035)
Urobilinogen, UR: 0.2 mg/dL (ref 0.2–1)

## 2015-05-21 LAB — CBC WITH DIFFERENTIAL/PLATELET
BASO%: 0.5 % (ref 0.0–2.0)
Basophils Absolute: 0.1 10*3/uL (ref 0.0–0.1)
EOS ABS: 1.6 10*3/uL — AB (ref 0.0–0.5)
EOS%: 10.7 % — AB (ref 0.0–7.0)
HCT: 37.4 % (ref 34.8–46.6)
HEMOGLOBIN: 12 g/dL (ref 11.6–15.9)
LYMPH#: 1.7 10*3/uL (ref 0.9–3.3)
LYMPH%: 11.5 % — AB (ref 14.0–49.7)
MCH: 28.9 pg (ref 25.1–34.0)
MCHC: 32.2 g/dL (ref 31.5–36.0)
MCV: 89.7 fL (ref 79.5–101.0)
MONO#: 1.2 10*3/uL — ABNORMAL HIGH (ref 0.1–0.9)
MONO%: 7.6 % (ref 0.0–14.0)
NEUT%: 69.7 % (ref 38.4–76.8)
NEUTROS ABS: 10.6 10*3/uL — AB (ref 1.5–6.5)
Platelets: 378 10*3/uL (ref 145–400)
RBC: 4.16 10*6/uL (ref 3.70–5.45)
RDW: 15.7 % — ABNORMAL HIGH (ref 11.2–14.5)
WBC: 15.2 10*3/uL — ABNORMAL HIGH (ref 3.9–10.3)

## 2015-05-21 MED ORDER — TRAMADOL HCL 50 MG PO TABS: 50.0000 mg | ORAL_TABLET | Freq: Four times a day (QID) | ORAL | Status: DC | PRN

## 2015-05-21 MED ORDER — POTASSIUM CHLORIDE 20 MEQ/15ML (10%) PO SOLN: 20.0000 meq | Freq: Two times a day (BID) | ORAL | Status: DC

## 2015-05-21 NOTE — Telephone Encounter (Signed)
Spoke to pt and son. Advised lab results showed low potassium. Rx has been sent to [facility] on [address]. Son will pick this up so pt may begin taking tonight.   Pt understands the urinalysis came back showing no signs of infection. She will continue to monitor and push fluids.

## 2015-05-22 ENCOUNTER — Other Ambulatory Visit: Payer: Self-pay | Admitting: Oncology

## 2015-05-22 ENCOUNTER — Inpatient Hospital Stay
Admission: RE | Admit: 2015-05-22 | Discharge: 2015-05-22 | Disposition: A | Discharge status: Home / Self Care | Payer: Self-pay | Source: Ambulatory Visit | Attending: Oncology | Admitting: Oncology

## 2015-05-22 ENCOUNTER — Telehealth: Payer: Self-pay | Admitting: Nurse Practitioner

## 2015-05-22 ENCOUNTER — Encounter: Payer: Self-pay | Admitting: Nurse Practitioner

## 2015-05-22 DIAGNOSIS — E876 Hypokalemia: Secondary | ICD-10-CM | POA: Insufficient documentation

## 2015-05-22 DIAGNOSIS — E8809 Other disorders of plasma-protein metabolism, not elsewhere classified: Secondary | ICD-10-CM | POA: Insufficient documentation

## 2015-05-22 DIAGNOSIS — C189 Malignant neoplasm of colon, unspecified: Secondary | ICD-10-CM

## 2015-05-22 DIAGNOSIS — E46 Unspecified protein-calorie malnutrition: Secondary | ICD-10-CM | POA: Insufficient documentation

## 2015-05-22 LAB — CEA: CEA: 1.2 ng/mL (ref 0.0–5.0)

## 2015-05-22 LAB — CA 125: CA 125: 22 U/mL (ref <35)

## 2015-05-22 NOTE — Telephone Encounter (Signed)
Call patient's daughter to review both CEA and CA 125 results.  Advised daughter that both tumor markers were considered within normal range.  Also, advised daughter that Dr. Benay Spice has reviewed the CT scan CD; and it appears that patient's large abdominal mass appears to be originating from the uterus.  Advised daughter would order a stat GYN oncology referral for Dr. Denman George for further evaluation and management.  Confirmed that daughter would review this phone call with both the patient and the patient's son as well.  Advised the daughter that she should be expecting a phone call for a consultation with Dr. Denman George fairly soon.

## 2015-05-22 NOTE — Assessment & Plan Note (Signed)
Albumen has decreased from 3.9 down to 2.8.  Patient was encouraged.  Prescription her diet is much as possible.

## 2015-05-22 NOTE — Assessment & Plan Note (Signed)
Potassium down to 3.0 today.  Patient was prescribed potassium 20 mEq twice a day.  We'll continue to monitor closely.

## 2015-05-22 NOTE — Progress Notes (Signed)
SYMPTOM MANAGEMENT CLINIC   HPI: Susan Reed 81 y.o. female diagnosed with colon cancer.  Patient is status post hemocolectomy and Xeloda oral therapy in the past.  Currently undergoing observation only.   Patient is status post Hemoclip colectomy and Xeloda therapy and 2013.  Has been undergoing observation only.  Patient was seen by her primary care doctor recently with complaint of three-month history abdominal discomfort, bloating, and distention.  She denies any nausea, vomiting, diarrhea, or constipation.  She states the pain is progressively increasing.  She denies any recent fevers or chills.  Patient underwent a CT of the abdomen/pelvis with contrast on 05/12/2015 for further evaluation.  CT revealed large complex hypervascular pelvic mass arising in the midline, likely uterine in origin.  Differential diagnosis included endometrial carcinoma or sarcoma.  Ovarian malignancy was felt to be less likely, although difficult to exclude given the size of the mass.  Patient was also noted to have sigmoid diverticulosis without diverticulitis.  No direct bowel invasion; although, the pelvic mass is exerting mass effect upon the colon.  Patient was noted to have gallstones without any acute cholecystitis. (CT scan from [facility] to be scanned in to chart).   Patient presented to the [facility] today for further evaluation of her progressive abdominal mass.  Patient appeared well; with the exception of a fairly large lower abdominal mass that was tender with any palpation.  Patient had increased pain to the lower abdominal area with any movement.  Bowel sounds remained positive.  There was no flank pain on exam.  Blood counts obtained today were normal.  Patient was noted have a low potassium at 3.0.  Urinalysis obtained today was normal.  Urine culture results pending.  CT scan obtained on 05/12/2015 was reviewed per Dr. Benay Spice; and a stat GYN oncology referral was made for Dr.  Denman George to evaluate as soon as possible.  Patient was given a prescription for Ultram for her pain.  Patient is scheduled to return for labs and a follow-up visit on 06/03/2015.  She knows to call in the interim with any new worries or concerns.    HPI  ROS  Past Medical History  Diagnosis Date  . Anemia   . COPD (chronic obstructive pulmonary disease) ([place])   . Hyperlipidemia   . Osteoporosis   . Hypertension   . Glaucoma   . Hypothyroidism   . Blood transfusion     2009  . Colon cancer ([place])     colon cancer   . Rectal bleeding   . Abdominal bloating   . Allergy 08/09/12    codeine and lactose  . Anxiety   . Depression   . Blood transfusion without reported diagnosis 2009    5 years ago for anemia  . Cataract     Past Surgical History  Procedure Laterality Date  . Tonsillectomy    . Right colectomy  2013  . Band hemorrhoidectomy      by Dr. Harlow Asa  . Cataract extraction w/ intraocular lens implant Right 2008  . Colon surgery      has COPD (chronic obstructive pulmonary disease) ([place]); Neoplasm of colon, malignant ([place]); PVC (premature ventricular contraction); Hypothyroidism; Hemorrhoids, internal, with complication; Hypokalemia; and Hypoalbuminemia due to protein-calorie malnutrition ([facility]) on her problem list.    is allergic to codeine and lactose intolerance (gi).    Medication List       This list is accurate as of: 05/21/15 11:59 PM.  Always use your most  recent med list.               aspirin 81 MG tablet  Take 81 mg by mouth daily with breakfast.     CALTRATE PLUS PO  Take 1 tablet by mouth daily.     cycloSPORINE 0.05 % ophthalmic emulsion  Commonly known as:  RESTASIS  Place 1 drop into both eyes 2 (two) times daily.     dicyclomine 10 MG capsule  Commonly known as:  BENTYL  Take 1 capsule by mouth 4 (four) times daily -  before meals and at bedtime.     dorzolamide-timolol 22.3-6.8 MG/ML ophthalmic solution  Commonly known as:  COSOPT    Place 1 drop into both eyes 2 (two) times daily.     fish oil-omega-3 fatty acids 1000 MG capsule  Take 1 g by mouth daily.     levothyroxine 75 MCG tablet  Commonly known as:  SYNTHROID, LEVOTHROID  Take 75 mcg by mouth daily before breakfast.     loperamide 2 MG capsule  Commonly known as:  IMODIUM  Take 2 mg by mouth as needed. Usually takes 1/2 tab prn     mirtazapine 7.5 MG tablet  Commonly known as:  REMERON  Take 1 tablet (7.5 mg total) by mouth at bedtime.     multivitamin capsule  Take 1 capsule by mouth daily.     potassium chloride 20 MEQ/15ML (10%) Soln  Take 15 mLs (20 mEq total) by mouth 2 (two) times daily.     PROBIOTIC DAILY PO  Take 1 capsule by mouth daily.     TOBRADEX ophthalmic ointment  Generic drug:  tobramycin-dexamethasone  Apply to eye daily as needed.     traMADol 50 MG tablet  Commonly known as:  ULTRAM  Take 1 tablet (50 mg total) by mouth every 6 (six) hours as needed.     Vitamin A & D 10000-400 units Caps     VITAMIN D-3 PO  Take 1,000 Units by mouth daily.         PHYSICAL EXAMINATION  Oncology Vitals 05/21/2015 04/25/2015  Height 152 cm 152 cm  Weight 47.219 kg 46.38 kg  Weight (lbs) 104 lbs 2 oz 102 lbs 4 oz  BMI (kg/m2) 20.5 kg/m2 20.14 kg/m2  Temp 98 -  Pulse 89 96  Resp 18 -  Resp (Historical as of 12/23/11) - -  SpO2 99 -  BSA (m2) 1.41 m2 1.4 m2   BP Readings from Last 2 Encounters:  05/21/15 130/59  04/25/15 144/70    Physical Exam  Constitutional: She is oriented to person, place, and time and well-developed, well-nourished, and in no distress.  HENT:  Head: Normocephalic and atraumatic.  Mouth/Throat: Oropharynx is clear and moist.  Eyes: Conjunctivae and EOM are normal. Pupils are equal, round, and reactive to light. Right eye exhibits no discharge. Left eye exhibits no discharge. No scleral icterus.  Neck: Normal range of motion. Neck supple. No JVD present. No tracheal deviation present. No thyromegaly  present.  Cardiovascular: Normal rate, regular rhythm, normal heart sounds and intact distal pulses.   Pulmonary/Chest: Effort normal and breath sounds normal. No respiratory distress. She has no wheezes. She has no rales. She exhibits no tenderness.  Abdominal: Soft. Bowel sounds are normal. She exhibits distension and mass. There is tenderness. There is no rebound and no guarding.  Patient appeared well; with the exception of a fairly large lower abdominal mass that was tender with any palpation.  Patient  had increased pain to the lower abdominal area with any movement.  Bowel sounds remained positive.  There was no flank pain on exam.     Musculoskeletal: Normal range of motion. She exhibits no edema or tenderness.  Lymphadenopathy:    She has no cervical adenopathy.  Neurological: She is alert and oriented to person, place, and time. Gait normal.  Skin: Skin is warm and dry. No rash noted. No erythema. No pallor.  Psychiatric: Affect normal.  Nursing note and vitals reviewed.   LABORATORY DATA:. Appointment on 05/21/2015  Component Date Value Ref Range Status  . Glucose 05/21/2015 Negative  Negative mg/dL Final  . Bilirubin (Urine) 05/21/2015 Negative  Negative Final  . Ketones 05/21/2015 Negative  Negative mg/dL Final  . Specific Gravity, Urine 05/21/2015 1.010  1.003 - 1.035 Final  . Blood 05/21/2015 Negative  Negative Final  . pH 05/21/2015 6.5  4.6 - 8.0 Final  . Protein 05/21/2015 < 30  Negative- <30 mg/dL Final  . Urobilinogen, UR 05/21/2015 0.2  0.2 - 1 mg/dL Final  . Nitrite 05/21/2015 Negative  Negative Final  . Leukocyte Esterase 05/21/2015 Negative  Negative Final  . RBC / HPF 05/21/2015 Negative  0 - 2 Final  . WBC, UA 05/21/2015 0-2  0 - 2 Final  . Bacteria, UA 05/21/2015 Negative  Negative- Trace Final  . Epithelial Cells 05/21/2015 Few  Negative- Few Final  . WBC 05/21/2015 15.2* 3.9 - 10.3 10e3/uL Final  . NEUT# 05/21/2015 10.6* 1.5 - 6.5 10e3/uL Final  . HGB  05/21/2015 12.0  11.6 - 15.9 g/dL Final  . HCT 05/21/2015 37.4  34.8 - 46.6 % Final  . Platelets 05/21/2015 378  145 - 400 10e3/uL Final  . MCV 05/21/2015 89.7  79.5 - 101.0 fL Final  . MCH 05/21/2015 28.9  25.1 - 34.0 pg Final  . MCHC 05/21/2015 32.2  31.5 - 36.0 g/dL Final  . RBC 05/21/2015 4.16  3.70 - 5.45 10e6/uL Final  . RDW 05/21/2015 15.7* 11.2 - 14.5 % Final  . lymph# 05/21/2015 1.7  0.9 - 3.3 10e3/uL Final  . MONO# 05/21/2015 1.2* 0.1 - 0.9 10e3/uL Final  . Eosinophils Absolute 05/21/2015 1.6* 0.0 - 0.5 10e3/uL Final  . Basophils Absolute 05/21/2015 0.1  0.0 - 0.1 10e3/uL Final  . NEUT% 05/21/2015 69.7  38.4 - 76.8 % Final  . LYMPH% 05/21/2015 11.5* 14.0 - 49.7 % Final  . MONO% 05/21/2015 7.6  0.0 - 14.0 % Final  . EOS% 05/21/2015 10.7* 0.0 - 7.0 % Final  . BASO% 05/21/2015 0.5  0.0 - 2.0 % Final  . Sodium 05/21/2015 139  136 - 145 mEq/L Final  . Potassium 05/21/2015 3.0 Repeated and Verified* 3.5 - 5.1 mEq/L Final  . Chloride 05/21/2015 104  98 - 109 mEq/L Final  . CO2 05/21/2015 24  22 - 29 mEq/L Final  . Glucose 05/21/2015 93  70 - 140 mg/dl Final   Glucose reference range is for nonfasting patients. Fasting glucose reference range is 70- 100.  Marland Kitchen BUN 05/21/2015 8.5  7.0 - 26.0 mg/dL Final  . Creatinine 05/21/2015 0.6  0.6 - 1.1 mg/dL Final  . Total Bilirubin 05/21/2015 0.53  0.20 - 1.20 mg/dL Final  . Alkaline Phosphatase 05/21/2015 81  40 - 150 U/L Final  . AST 05/21/2015 10  5 - 34 U/L Final  . ALT 05/21/2015 <9  0 - 55 U/L Final  . Total Protein 05/21/2015 6.5  6.4 - 8.3 g/dL Final  .  Albumin 05/21/2015 2.8* 3.5 - 5.0 g/dL Final  . Calcium 05/21/2015 8.4  8.4 - 10.4 mg/dL Final  . Anion Gap 05/21/2015 11  3 - 11 mEq/L Final  . EGFR 05/21/2015 81* >90 ml/min/1.73 m2 Final   eGFR is calculated using the CKD-EPI Creatinine Equation (2009)  . CA 125 05/21/2015 22  <35 U/mL Final   Comment:   This test was performed using the Beckman Coulter chemiluminescent method.   Values obtained from different assay methods cannot be used interchangeably.  CA125 levels , regardless of value, should not be interpreted as absolute evidence of the presence or absence of disease.   . CEA 05/21/2015 1.2  0.0 - 5.0 ng/mL Final     RADIOGRAPHIC STUDIES: No results found.  ASSESSMENT/PLAN:    Neoplasm of colon, malignant ([place]) Patient is status post Hemoclip colectomy and Xeloda therapy and 2013.  Has been undergoing observation only.  Patient was seen by her primary care doctor recently with complaint of three-month history abdominal discomfort, bloating, and distention.  She denies any nausea, vomiting, diarrhea, or constipation.  She states the pain is progressively increasing.  She denies any recent fevers or chills.  Patient underwent a CT of the abdomen/pelvis with contrast on 05/12/2015 for further evaluation.  CT revealed large complex hypervascular pelvic mass arising in the midline, likely uterine in origin.  Differential diagnosis included endometrial carcinoma or sarcoma.  Ovarian malignancy was felt to be less likely, although difficult to exclude given the size of the mass.  Patient was also noted to have sigmoid diverticulosis without diverticulitis.  No direct bowel invasion; although, the pelvic mass is exerting mass effect upon the colon.  Patient was noted to have gallstones without any acute cholecystitis. (CT scan from [facility] to be scanned in to chart).   Patient presented to the [facility] today for further evaluation of her progressive abdominal mass.  Patient appeared well; with the exception of a fairly large lower abdominal mass that was tender with any palpation.  Patient had increased pain to the lower abdominal area with any movement.  Bowel sounds remained positive.  There was no flank pain on exam.  Blood counts obtained today were normal.  Patient was noted have a low potassium at 3.0.  Urinalysis obtained today was normal.  Urine  culture results pending.  CT scan obtained on 05/12/2015 was reviewed per Dr. Benay Spice; and a stat GYN oncology referral was made for Dr. Denman George to evaluate as soon as possible.  Patient was given a prescription for Ultram for her pain.  Patient is scheduled to return for labs and a follow-up visit on 06/03/2015.  She knows to call in the interim with any new worries or concerns.     Hypokalemia Potassium down to 3.0 today.  Patient was prescribed potassium 20 mEq twice a day.  We'll continue to monitor closely.  Hypoalbuminemia due to protein-calorie malnutrition ([place]) Albumen has decreased from 3.9 down to 2.8.  Patient was encouraged.  Prescription her diet is much as possible.  Patient stated understanding of all instructions; and was in agreement with this plan of care. The patient knows to call the clinic with any problems, questions or concerns.   This is shared visit with Dr. Benay Spice today.    Total time spent with patient was 25 minutes;  with greater than 75 percent of that time spent in face to face counseling regarding patient's symptoms,  and coordination of care and follow up.  Disclaimer:This dictation  was prepared with Dragon/digital dictation along with [employer]. Any transcriptional errors that result from this process are unintentional.  Drue Second, NP 05/22/2015   This was a shared visit with Drue Second. Ms. Strahm was interviewed and examined. I reviewed the CT from 05/12/2015 with a radiologist. The large pelvic mass appears to be arising from the uterus. The differential diagnosis includes an atypical fibroid, uterine carcinoma, uterine sarcoma, and metastatic colon cancer. I suspect the mass is related to metastatic colon cancer based on her presentation with stage IIIc disease in July 2013. I think it is reasonable to consider surgical removal of the mass given her symptoms, the disease-free interval, and the lack of evidence for additional  metastatic disease.   She will be referred to GYN oncology for further evaluation.  Julieanne Manson, M.D.

## 2015-05-22 NOTE — Assessment & Plan Note (Addendum)
Patient is status post Hemoclip colectomy and Xeloda therapy and 2013.  Has been undergoing observation only.  Patient was seen by her primary care doctor recently with complaint of three-month history abdominal discomfort, bloating, and distention.  She denies any nausea, vomiting, diarrhea, or constipation.  She states the pain is progressively increasing.  She denies any recent fevers or chills.  Patient underwent a CT of the abdomen/pelvis with contrast on 05/12/2015 for further evaluation.  CT revealed large complex hypervascular pelvic mass arising in the midline, likely uterine in origin.  Differential diagnosis included endometrial carcinoma or sarcoma.  Ovarian malignancy was felt to be less likely, although difficult to exclude given the size of the mass.  Patient was also noted to have sigmoid diverticulosis without diverticulitis.  No direct bowel invasion; although, the pelvic mass is exerting mass effect upon the colon.  Patient was noted to have gallstones without any acute cholecystitis. (CT scan from [facility] to be scanned in to chart).   Patient presented to the [facility] today for further evaluation of her progressive abdominal mass.  Patient appeared well; with the exception of a fairly large lower abdominal mass that was tender with any palpation.  Patient had increased pain to the lower abdominal area with any movement.  Bowel sounds remained positive.  There was no flank pain on exam.  Blood counts obtained today were normal.  Patient was noted have a low potassium at 3.0.  Urinalysis obtained today was normal.  Urine culture results pending.  CT scan obtained on 05/12/2015 was reviewed per Dr. Benay Spice; and a stat GYN oncology referral was made for Dr. Denman George to evaluate as soon as possible.  Patient was given a prescription for Ultram for her pain.  Patient is scheduled to return for labs and a follow-up visit on 06/03/2015.  She knows to call in the interim with any  new worries or concerns.

## 2015-05-23 LAB — URINE CULTURE

## 2015-05-30 ENCOUNTER — Ambulatory Visit: Payer: Medicare Other | Attending: Gynecologic Oncology | Admitting: Gynecologic Oncology

## 2015-05-30 ENCOUNTER — Encounter: Payer: Self-pay | Admitting: Gynecologic Oncology

## 2015-05-30 ENCOUNTER — Telehealth: Payer: Self-pay | Admitting: Gynecologic Oncology

## 2015-05-30 ENCOUNTER — Other Ambulatory Visit: Payer: Self-pay | Admitting: Nurse Practitioner

## 2015-05-30 VITALS — BP 161/69 | HR 89 | Temp 98.5°F | Resp 18 | Ht 59.75 in | Wt 103.9 lb

## 2015-05-30 DIAGNOSIS — R1030 Lower abdominal pain, unspecified: Secondary | ICD-10-CM | POA: Diagnosis not present

## 2015-05-30 DIAGNOSIS — E43 Unspecified severe protein-calorie malnutrition: Secondary | ICD-10-CM

## 2015-05-30 DIAGNOSIS — R1900 Intra-abdominal and pelvic swelling, mass and lump, unspecified site: Secondary | ICD-10-CM | POA: Insufficient documentation

## 2015-05-30 DIAGNOSIS — Z85038 Personal history of other malignant neoplasm of large intestine: Secondary | ICD-10-CM | POA: Diagnosis not present

## 2015-05-30 DIAGNOSIS — C189 Malignant neoplasm of colon, unspecified: Secondary | ICD-10-CM | POA: Insufficient documentation

## 2015-05-30 DIAGNOSIS — Z780 Asymptomatic menopausal state: Secondary | ICD-10-CM

## 2015-05-30 MED ORDER — TRAMADOL HCL 50 MG PO TABS: 50.0000 mg | ORAL_TABLET | Freq: Four times a day (QID) | ORAL | Status: DC | PRN

## 2015-05-30 NOTE — Telephone Encounter (Signed)
Returned call to patient's daughter.  Left message asking her to call the office.  Attempted to return call again at 4:13pm.  No answer.

## 2015-05-30 NOTE — Patient Instructions (Signed)
Please call us when you have decided to proceed with surgery at [facility] or to proceed with a biopsy and chemotherapy.  The first available at [facility] is Jan 18 but there are also openings on the 19th and 20th as well.  You will have to go down to [facility] for a pre-op appt prior to surgery.

## 2015-05-30 NOTE — Addendum Note (Signed)
Addended by: Joylene John D on: 05/30/2015 04:32 PM   Modules accepted: Orders

## 2015-05-30 NOTE — Progress Notes (Signed)
Consult Note: Gyn-Onc  Consult was requested by Dr. Dr Benay Spice for the evaluation of Susan Reed 82 y.o. female with a 15cm pelvic mass  CC:  Chief Complaint  Patient presents with  . Pelvic mass    New consult    Assessment/Plan:  Susan Reed  is a 82 y.o.  year old with a history of colon cancer (stage IIIc) in 2013 who now has a symptomatic 15cm pelvic mass. This mass is highly suspicious for malignancy given its solid nature on imaging, its significant growth in the past 3 years in a postmenopausal woman, and her recent history of advanced colon cancer. The patient otherwise has severe malnutrition with a recent albumin of 2.8. She is ultimately. She has a recent history of good functional status however in the past 3 months has had poor functional status due to weakness and fatigue and discomfort from the mass. For these reasons she is a highly risky surgical candidate for significant surgical morbidity. However the patient is extremely symptomatic with the mass and her main priority is palliation of symptoms from mass effect. I did discuss an alternative to surgery could be biopsy of the mass and directed chemotherapy based on the histologic findings, however it is unlikely that chemotherapy will resolve her symptoms of mass effect in a timely fashion if at all.  A surgical intervention to remove this mass would require exploratory laparotomy radical TAH/BSO possible colonic resection and colostomy and radical debulking. I discussed the nature of such a procedure and the prolonged convalescence. I discussed the high risk for massive blood loss requiring blood transfusion, and the high risk of failure to heal the wound or other sites due to her poor nutritional status preoperatively. I discussed that perioperative death is a risk in an elderly patient with such a radical surgery. Given that the goal of therapy is to palliate symptoms, if she does elect for surgery we will would  perform the surgery and expedited time and this will require surgery to be performed in [facility] with one of my partners.  The patient and her daughter desire to discuss options of surgery versus biopsy and chemotherapy with each other and there family members and will notify us when it made a decision.   HPI: Susan Reed is a 82 year old woman who is seen in consultation the request of Dr. Benay Spice for a large pelvic mass. The patient has a history of stage IIIc (sees T4 N2) poorly differentiated adenocarcinoma of the cecum and a status post right hemicolectomy in 06/24/2011. She was then treated with Xeloda chemotherapy on 07/26/2011 completing her eighth and final cycle on 12/21/2011. At the time of her initial cancer diagnosis her CEA was elevated to 7.5. After resection and treatment of her tumor marker has remained low and stable with the most recent value on 05/21/2015 of 1.2. She had undergone imaging with MRI and pelvic ultrasound prior to her surgery in 2013 and this imaging identified a 4 cm fundal uterine mass which is felt to represent either fibroid or carcinoma. Following her chemotherapy and during her surveillance phase of her colon cancer she has not had additional imaging.  However in September 2016 she began feeling weak, anorexia, and altered GI status with intermittent constipation and diarrhea. She began feeling increasing lower abdominal girth and discomfort and pain. She began noticing a bulge in her lower abdomen and was seen and evaluated at the cancer center where she was referred to have a CT of the abdomen  and pelvis which was performed at the [facility] on 05/12/2015. This revealed a large heterogeneous pelvic mass measuring 14 cm x 10 cm x 8 cm. No normal uterus or ovaries were visualized. The mass is midline. The gonadal vessels were enlarged bilaterally. There was no ascites or mesenteric implants or adenopathy identified. It was unclear the origin of the mass however  endometrial cancer or sarcoma were felt to be likely.  The patient denies vaginal bleeding or abnormal discharge. She underwent a CT a 125 evaluation on 05/21/2015 which was normal at 22, and a CEA on 05/21/2015 was normal at 1.2. Albumin on 05/21/15 was 2.8.  Interval History: She is very symptomatic from this mass very uncomfortable, finding it difficult to sleep. She has been spending most of her time in her chair or resting in her bed. She lives alone however has a very supportive daughter who lives nearby whom she recovered after her last surgery and who would assist with recovery after any additional surgeries. The patient had previously enjoyed independent mobility in good functional status however in the last 2 months this is declined. She's noticed a 14 pound weight loss in the past 6 months.  Current Meds:  Outpatient Encounter Prescriptions as of 1/80/2017  Medication Sig  . aspirin 81 MG tablet Take 81 mg by mouth daily with breakfast.   . Calcium Carbonate-Vit D-Min (CALTRATE PLUS PO) Take 1 tablet by mouth daily.   . Cholecalciferol (VITAMIN D-3 PO) Take 1,000 Units by mouth daily.   . cycloSPORINE (RESTASIS) 0.05 % ophthalmic emulsion Place 1 drop into both eyes 2 (two) times daily.   . dorzolamide-timolol (COSOPT) 22.3-6.8 MG/ML ophthalmic solution Place 1 drop into both eyes 2 (two) times daily.   Marland Kitchen levothyroxine (SYNTHROID, LEVOTHROID) 75 MCG tablet Take 75 mcg by mouth daily before breakfast.  . mirtazapine (REMERON) 7.5 MG tablet Take 1 tablet (7.5 mg total) by mouth at bedtime.  . Multiple Vitamin (MULTIVITAMIN) capsule Take 1 capsule by mouth daily.  . potassium chloride 20 MEQ/15ML (10%) SOLN Take 15 mLs (20 mEq total) by mouth 2 (two) times daily.  B[facility] ophthalmic ointment Apply to eye daily as needed.  . traMADol (ULTRAM) 50 MG tablet Take 1 tablet (50 mg total) by mouth every 6 (six) hours as needed.  . dicyclomine (BENTYL) 10 MG capsule Take 1 capsule by mouth 4  (four) times daily -  before meals and at bedtime. Reported on 05/30/2015  . fish oil-omega-3 fatty acids 1000 MG capsule Take 1 g by mouth daily. Reported on 05/30/2015  . loperamide (IMODIUM) 2 MG capsule Take 2 mg by mouth as needed. Reported on 05/30/2015  . Vitamins A & D (VITAMIN A & D) 10000-400 UNITS CAPS Reported on 05/30/2015  . [DISCONTINUED] Probiotic Product (PROBIOTIC DAILY PO) Take 1 capsule by mouth daily.   No facility-administered encounter medications on file as of 05/30/2015.    Allergy:  Allergies  Allergen Reactions  . Codeine Nausea And Vomiting  . Lactose Intolerance (Gi)     Social Hx:   Social History   Social History  . Marital Status: Widowed    Spouse Name: N/A  . Number of Children: 4  . Years of Education: N/A   Occupational History  . Retired     Social History Main Topics  . Smoking status: Never Smoker   . Smokeless tobacco: Never Used  . Alcohol Use: No     Comment: occ  . Drug Use: No  .  Sexual Activity: Not on file   Other Topics Concern  . Not on file   Social History Narrative   Widow.  Originally from [place].  Lives in independent living.    Past Surgical Hx:  Past Surgical History  Procedure Laterality Date  . Tonsillectomy    . Right colectomy  2013  . Band hemorrhoidectomy      by Dr. Harlow Asa  . Cataract extraction w/ intraocular lens implant Right 2008  . Colon surgery      Past Medical Hx:  Past Medical History  Diagnosis Date  . Anemia   . COPD (chronic obstructive pulmonary disease) ([place])   . Hyperlipidemia   . Osteoporosis   . Hypertension   . Glaucoma   . Hypothyroidism   . Blood transfusion     2009  . Colon cancer ([place])     colon cancer   . Rectal bleeding   . Abdominal bloating   . Allergy 08/09/12    codeine and lactose  . Anxiety   . Depression   . Blood transfusion without reported diagnosis 2009    5 years ago for anemia  . Cataract     Past Gynecological History:   No LMP recorded. Patient  is postmenopausal.  Family Hx:  Family History  Problem Relation Age of Onset  . Heart disease Father   . Cancer Sister     pt unaware of what kind, but knows it was female reproductive area  . Cancer Brother     lymphoma  . Colon cancer Neg Hx     Review of Systems:  Constitutional  Feels fatigued  ENT Normal appearing ears and nares bilaterally Skin/Breast  No rash, sores, jaundice, itching, dryness Cardiovascular  No chest pain, shortness of breath, or edema  Pulmonary  No cough or wheeze.  Gastro Intestinal  + change in bowel habit to intermittent constipation and diarreha. Abdominal pain (low central). Genito Urinary  No frequency, urgency, dysuria, see HPI Musculo Skeletal  No myalgia, arthralgia, joint swelling or pain  Neurologic  No weakness, numbness, change in gait,  Psychology  No depression, anxiety, insomnia.   Vitals:  Blood pressure 161/69, pulse 89, temperature 98.5 F (36.9 C), temperature source Oral, resp. rate 18, height 4' 11.75" (1.518 m), weight 103 lb 14.4 oz (47.129 kg), SpO2 98 %.  Physical Exam: Temporal wasting. Neck  Supple NROM, without any enlargements.  Lymph Node Survey No cervical supraclavicular or inguinal adenopathy Cardiovascular  Pulse normal rate, regularity and rhythm. S1 and S2 normal.  Lungs  Clear to auscultation bilateraly, without wheezes/crackles/rhonchi. Good air movement.  Skin  No rash/lesions/breakdown  Psychiatry  Alert and oriented to person, place, and time  Abdomen  Normoactive bowel sounds, abdomen soft, non-tender and thin without evidence of hernia. Large mass filling central low abdomen. Minimally mobile. Tender to palpate Back No CVA tenderness Genito Urinary  Vulva/vagina: Normal external female genitalia.  No lesions. No discharge or bleeding.  Bladder/urethra:  No lesions or masses, well supported bladder  Vagina: grossly normal  Cervix: deviated anteriorally unable to be visualized. Palpably  normal, though firm to touch  Uterus: Large conglomerate mass in central pelvis 15-20cm, minimally mobile, predominantly in anterior pelvis. Not appreciated on RV exam.  Adnexa:  Mass as described above Rectal  Good tone, no masses no cul de sac nodularity.  Extremities  No bilateral cyanosis, clubbing or edema.   Donaciano Eva, MD  05/30/2015, 10:06 AM

## 2015-05-30 NOTE — Telephone Encounter (Signed)
Patient's daughter called the office and stated that the patient and her family have decided to proceed with a biopsy of the mass with potential chemotherapy based on results.  Order placed.  Advised she would receive a phone call from [facility] Radiology with an appt.

## 2015-06-02 ENCOUNTER — Telehealth: Payer: Self-pay | Admitting: Oncology

## 2015-06-02 ENCOUNTER — Telehealth: Payer: Self-pay | Admitting: *Deleted

## 2015-06-02 NOTE — Telephone Encounter (Signed)
Called and left a message with 11/8 appointment.

## 2015-06-02 NOTE — Telephone Encounter (Signed)
Per Dr. Benay Spice; spoke with pt's daughter and informed her since biopsy scheduled for Wed 1/11; we can re-scheduled MD app 1/10t to 1/18 and call biopsy report to family by end of the week.  Pt's daughter verbalized understanding and states "that will work for [place]"

## 2015-06-03 ENCOUNTER — Other Ambulatory Visit: Payer: Medicare HMO

## 2015-06-03 ENCOUNTER — Other Ambulatory Visit: Payer: Self-pay | Admitting: Physician Assistant

## 2015-06-03 ENCOUNTER — Ambulatory Visit: Payer: Medicare HMO | Admitting: Oncology

## 2015-06-04 ENCOUNTER — Ambulatory Visit (HOSPITAL_COMMUNITY)
Admission: RE | Admit: 2015-06-04 | Discharge: 2015-06-04 | Disposition: A | Discharge status: Home / Self Care | Payer: Medicare Other | Source: Ambulatory Visit | Attending: Gynecologic Oncology | Admitting: Gynecologic Oncology

## 2015-06-04 ENCOUNTER — Encounter (HOSPITAL_COMMUNITY): Payer: Self-pay

## 2015-06-04 DIAGNOSIS — R1900 Intra-abdominal and pelvic swelling, mass and lump, unspecified site: Secondary | ICD-10-CM | POA: Diagnosis not present

## 2015-06-04 DIAGNOSIS — C189 Malignant neoplasm of colon, unspecified: Secondary | ICD-10-CM | POA: Diagnosis not present

## 2015-06-04 DIAGNOSIS — Z5309 Procedure and treatment not carried out because of other contraindication: Secondary | ICD-10-CM | POA: Diagnosis not present

## 2015-06-04 LAB — CBC
HEMATOCRIT: 38.7 % (ref 36.0–46.0)
HEMOGLOBIN: 12.5 g/dL (ref 12.0–15.0)
MCH: 29.8 pg (ref 26.0–34.0)
MCHC: 32.3 g/dL (ref 30.0–36.0)
MCV: 92.4 fL (ref 78.0–100.0)
Platelets: 363 10*3/uL (ref 150–400)
RBC: 4.19 MIL/uL (ref 3.87–5.11)
RDW: 16.5 % — ABNORMAL HIGH (ref 11.5–15.5)
WBC: 16 10*3/uL — ABNORMAL HIGH (ref 4.0–10.5)

## 2015-06-04 LAB — PROTIME-INR
INR: 3.44 — ABNORMAL HIGH (ref 0.00–1.49)
INR: 3.69 — AB (ref 0.00–1.49)
PROTHROMBIN TIME: 34 s — AB (ref 11.6–15.2)
Prothrombin Time: 35.7 seconds — ABNORMAL HIGH (ref 11.6–15.2)

## 2015-06-04 LAB — APTT: aPTT: 36 seconds (ref 24–37)

## 2015-06-04 MED ORDER — SODIUM CHLORIDE 0.9 % IV SOLN
INTRAVENOUS | Status: DC
  Administered 2015-06-04: 11:00:00 via INTRAVENOUS

## 2015-06-04 NOTE — Progress Notes (Signed)
Patient ID: Susan Reed, female   DOB: Oct 10, 1924, 82 y.o.   MRN: [id number] Patient presented to radiology dept today for image guided pelvic mass biopsy. PT/INR values were elevated initially at 34/3.4 with repeat of 35.7/3.69. Per Dr. Laurence Ferrari the above values would make bleeding risk significantly higher therefore biopsy was canceled. Dr.Sherrill was notified and additional PT/PTT mixing studies were ordered. Patient scheduled to see Dr. Benay Spice next week.  Dr. Serita Grit NP, Joylene John also informed of above. Discussed plans with pt/daughter.

## 2015-06-04 NOTE — Progress Notes (Signed)
Ordered lab work drawn after patient returned from interventional radiology (see below - IR procedure not done). Verified with lab which tubes to draw blood in. Patient and daughter aware of time to return to [place] next Wednesday. Patient escorted to car in wheelchair (arrived in one due to feeling weak) with RN and daughter.

## 2015-06-06 LAB — PTT FACTOR INHIBITOR (MIXING STUDY)
APTT 1 1 NORMAL PLASMA: 26.3 s (ref 22.9–30.2)
APTT 11 NP INCUB. MIX CTL: 31 s — AB (ref 22.9–30.2)
APTT 11 NP MIX, 60 MIN, INCUB.: 28.3 s (ref 22.9–30.2)
APTT: 33.2 s — AB (ref 22.9–30.2)
aPTT 1:1 Mix Saline: 58.6 s

## 2015-06-06 LAB — PT FACTOR INHIBITOR (MIXING STUDY)
1 HR INCUB PT 1:1NP: 11.7 s — ABNORMAL HIGH (ref 9.6–11.5)
PT 1:1NP: 11.8 s — ABNORMAL HIGH (ref 9.6–11.5)
PT: 44.7 s — AB (ref 9.6–11.5)

## 2015-06-11 ENCOUNTER — Ambulatory Visit (HOSPITAL_BASED_OUTPATIENT_CLINIC_OR_DEPARTMENT_OTHER): Payer: Medicare Other | Admitting: Oncology

## 2015-06-11 ENCOUNTER — Other Ambulatory Visit: Payer: Medicare Other

## 2015-06-11 ENCOUNTER — Ambulatory Visit (HOSPITAL_BASED_OUTPATIENT_CLINIC_OR_DEPARTMENT_OTHER): Payer: Medicare Other

## 2015-06-11 ENCOUNTER — Telehealth: Payer: Self-pay | Admitting: Nurse Practitioner

## 2015-06-11 ENCOUNTER — Other Ambulatory Visit: Payer: Self-pay | Admitting: *Deleted

## 2015-06-11 VITALS — BP 138/66 | HR 83 | Temp 98.0°F | Resp 17 | Ht 59.75 in | Wt 103.4 lb

## 2015-06-11 DIAGNOSIS — F329 Major depressive disorder, single episode, unspecified: Secondary | ICD-10-CM

## 2015-06-11 DIAGNOSIS — R791 Abnormal coagulation profile: Secondary | ICD-10-CM | POA: Diagnosis not present

## 2015-06-11 DIAGNOSIS — Z85038 Personal history of other malignant neoplasm of large intestine: Secondary | ICD-10-CM

## 2015-06-11 DIAGNOSIS — R1900 Intra-abdominal and pelvic swelling, mass and lump, unspecified site: Secondary | ICD-10-CM

## 2015-06-11 DIAGNOSIS — C189 Malignant neoplasm of colon, unspecified: Secondary | ICD-10-CM

## 2015-06-11 DIAGNOSIS — J449 Chronic obstructive pulmonary disease, unspecified: Secondary | ICD-10-CM | POA: Diagnosis not present

## 2015-06-11 DIAGNOSIS — E876 Hypokalemia: Secondary | ICD-10-CM

## 2015-06-11 DIAGNOSIS — D689 Coagulation defect, unspecified: Secondary | ICD-10-CM

## 2015-06-11 LAB — BASIC METABOLIC PANEL
Anion Gap: 8 mEq/L (ref 3–11)
BUN: 9.9 mg/dL (ref 7.0–26.0)
CHLORIDE: 101 meq/L (ref 98–109)
CO2: 25 mEq/L (ref 22–29)
Calcium: 8 mg/dL — ABNORMAL LOW (ref 8.4–10.4)
Creatinine: 0.6 mg/dL (ref 0.6–1.1)
EGFR: 83 mL/min/{1.73_m2} — ABNORMAL LOW (ref >90)
Glucose: 107 mg/dl (ref 70–140)
POTASSIUM: 3.4 meq/L — AB (ref 3.5–5.1)
SODIUM: 134 meq/L — AB (ref 136–145)

## 2015-06-11 LAB — PROTIME-INR
INR: 3.9 — ABNORMAL HIGH (ref 2.00–3.50)
Protime: 46.8 Seconds — ABNORMAL HIGH (ref 10.6–13.4)

## 2015-06-11 MED ORDER — PHYTONADIONE 5 MG PO TABS: 10.0000 mg | ORAL_TABLET | Freq: Two times a day (BID) | ORAL | Status: DC

## 2015-06-11 NOTE — Telephone Encounter (Signed)
per pof to sch pt appt-gave pt cop of avs

## 2015-06-11 NOTE — Progress Notes (Signed)
[place] OFFICE PROGRESS NOTE   Diagnosis: Colon cancer  INTERVAL HISTORY:   Ms. Munn returns as scheduled. She saw Dr. Denman George to consider resection of the pelvic mass. Dr. Denman George felt the procedure could be associated with significant morbidity. As per decided to proceed with a diagnostic biopsy. She was scheduled for the biopsy on 06/04/2015. Prior to the procedure a prothrombin time returned elevated at 34 seconds. This was repeated and returned at 36 seconds. The PTT was in the high normal range at 36 seconds.  The procedure was canceled. A PT mixing study corrected 211.8 seconds.  She reports no history of a bleeding disorder. She had no excessive bleeding with the colon cancer surgery several years ago and with tooth extraction procedures. No family history of a bleeding disorder. No recent antibiotics.  Ms. Sandrock complains of persistent pressure in the pelvis with incontinence of urine. She has a poor appetite. She feels "weak ". The pain is improved with tramadol.  Objective:  Vital signs in last 24 hours:  Blood pressure 138/66, pulse 83, temperature 98 F (36.7 C), temperature source Oral, resp. rate 17, height 4' 11.75" (1.518 m), weight 103 lb 6.4 oz (46.902 kg), SpO2 99 %.    Resp: Lungs clear bilaterally Cardio: Regular rate and rhythm GI: No hepatomegaly, mass filling the pelvis Vascular: Trace edema at the left lower leg, no erythema or tenderness   Lab Results:  06/11/2015-PT 46.8 seconds Imaging:   Medications: I have reviewed the patient's current medications.  Assessment/Plan: 1. Stage IIIc (T4 N2) poorly differentiated adenocarcinoma of the cecum status post right colectomy 06/24/2011. She began adjuvant Xeloda chemotherapy on 07/26/2011. She completed the eighth and final cycle beginning 12/21/2011. 2. History of anemia. Question if related to GI bleeding at presentation and then chemotherapy. 3. COPD. 4. Family history multiple  cancers. 5. History of anorexia/weight loss.  6. Report of balance difficulty and numbness in the feet when here 11/24/2011. Vtamin B 12 level in low normal range on 12/20/2011. Repeat level today was normal on 01/18/2012. 7. History of hand-foot syndrome secondary to Xeloda. Resolved. 8. History of vertigo. 9. Depression-she was started on a trial of Remeron following office visit 02/21/2012 with significant improvement. She is currently taking Remeron 7.5 mg daily. 10. Mildly elevated CEA July and August 2013-? Etiology. The CEA returned normal in December of 2013. ? related to chronic bronchitis or other inflammation. 11. CT abdomen/pelvis 05/12/2015 revealed a large pelvic mass, apparently arising from the uterus 12. Elevated prothrombin time   Disposition:  Ms. Whitner is symptomatic with a large pelvic mass causing pain and urinary urgency/incontinence. Dr. Denman George feels resection of the mass could be associated with significant morbidity. Ms. Boskovich was scheduled for a biopsy procedure, but this was canceled secondary to an elevated prothrombin time.  I discussed the differential diagnosis with Ms. Stansfield and her daughter. The mass may be related to recurrent colon cancer versus a primary uterine tumor.  The plan is to proceed with a biopsy procedure if the prothrombin time can be corrected. A mixing study suggest the elevated prothrombin time is related to a lack of clotting factor. This may be secondary to malnutrition or a specific factor deficiency. She will begin a trial of vitamin K today and return for a repeat PT/INR on 06/13/2015.  She will continue tramadol as needed for pain. I recommended placement of a Foley catheter, but she declined.  Ms. Luberda will be scheduled for an office visit 06/13/2015.  Betsy Coder, MD  06/11/2015  11:19 AM

## 2015-06-11 NOTE — Addendum Note (Signed)
Addended by: Domenic Schwab on: 06/11/2015 01:35 PM   Modules accepted: Orders

## 2015-06-12 ENCOUNTER — Ambulatory Visit (HOSPITAL_BASED_OUTPATIENT_CLINIC_OR_DEPARTMENT_OTHER): Payer: Medicare Other

## 2015-06-12 ENCOUNTER — Telehealth: Payer: Self-pay | Admitting: Oncology

## 2015-06-12 ENCOUNTER — Other Ambulatory Visit: Payer: Self-pay | Admitting: *Deleted

## 2015-06-12 ENCOUNTER — Other Ambulatory Visit: Payer: Self-pay | Admitting: Oncology

## 2015-06-12 ENCOUNTER — Telehealth: Payer: Self-pay | Admitting: *Deleted

## 2015-06-12 ENCOUNTER — Ambulatory Visit: Payer: Medicare Other

## 2015-06-12 DIAGNOSIS — C189 Malignant neoplasm of colon, unspecified: Secondary | ICD-10-CM

## 2015-06-12 DIAGNOSIS — D689 Coagulation defect, unspecified: Secondary | ICD-10-CM

## 2015-06-12 LAB — CEA: CEA1: 2 ng/mL (ref 0.0–4.7)

## 2015-06-12 LAB — CEA (PARALLEL TESTING): CEA: 1.1 ng/mL (ref 0.0–5.0)

## 2015-06-12 MED ORDER — PHYTONADIONE 5 MG PO TABS
10.0000 mg | ORAL_TABLET | Freq: Every day | ORAL | Status: DC
Start: 2015-06-12 — End: 2015-06-12
  Administered 2015-06-12: 10 mg via ORAL
  Filled 2015-06-12: qty 2

## 2015-06-12 NOTE — Telephone Encounter (Signed)
Pts dtr Lattie Haw called at 815 am stating prescription for oral Vitamin K - eight tablets would cost $ 600.   Lattie Haw is inquiring if there is an alternative or could this office obtain an override.  Noted per prescription pt was to use for 2 days only.  Per discussion Lattie Haw states she could bring her mother into the office if needed for vitamin K injection if appropriate.  Return call number given as [phone].  This note will be given directly to nurse with MD for follow up.

## 2015-06-12 NOTE — Patient Instructions (Signed)
Phytonadione, Vitamin K1 injection Qu es este medicamento? La FITONADIONA es una forma de vitamina K artificial. Este medicamento se utiliza para tratar la deficiencia de vitamina K o los problemas de sangrado causados por trastornos diversos. Este medicamento se administra tambin a recin nacidos para Cytogeneticist. Este medicamento puede ser utilizado para otros usos; si tiene alguna pregunta consulte con su proveedor de atencin mdica o con su farmacutico. Qu le debo informar a mi profesional de la salud antes de tomar este medicamento? Necesita saber si usted presenta alguno de los siguientes problemas o situaciones: -enfermedad heptica -una reaccin alrgica o inusual a la fitonadiona, a otros medicamentos, alimentos, colorantes o conservantes -si est embarazada o buscando quedar embarazada -si est amamantando a un beb Cmo debo utilizar este medicamento? Este medicamento se administra mediante una inyeccin por va subcutnea, intramuscular o, en raras ocasiones, por va intravenosa. Generalmente lo administra un profesional de Technical sales engineer en un hospital o en un entorno clnico. En los recin nacidos, este medicamento se Administrator en el msculo como una dosis nica, poco despus de que hayan nacido. Si recibe [employer] en su domicilio, le ensearn cmo preparar y Biomedical engineer. selo exactamente como se le indique. Use su dosis a intervalos regulares. No use su medicamento con una frecuencia mayor a la indicada. Es importante que deseche las agujas y las jeringas usadas en un recipiente resistente a los pinchazos. No las deseche en una basura. Si no tiene un recipiente resistente a los pinchazos, consulte a Midwife o su proveedor de atencin para obtenerlo. Hable con su pediatra para informarse acerca del uso de este medicamento en nios. Aunque este medicamento ha sido recetado a nios tan menores como de recin nacidos para condiciones selectivas,  las precauciones se aplican. Sobredosis: Pngase en contacto inmediatamente con un centro toxicolgico o una sala de urgencia si usted cree que haya tomado demasiado medicamento. ATENCIN: [employer] es solo para usted. No comparta este medicamento con nadie. Qu sucede si me olvido de una dosis? Si olvida una dosis, sela lo antes posible. Si es casi la hora de la prxima dosis, use slo esa dosis. No use dosis adicionales o dobles. Qu puede interactuar con este medicamento? -medicamentos que tratan o previenen cogulos sanguneos, como warfarina Puede ser que esta lista no menciona todas las posibles interacciones. Informe a su profesional de [employer]	[place] de [employer] productos a base de hierbas, medicamentos de [place] o suplementos nutritivos que est tomando. Si usted fuma, consume bebidas alcohlicas o si utiliza drogas ilegales, indqueselo tambin a su profesional de [employer]	[place]. Algunas sustancias pueden interactuar con su medicamento. A qu debo estar atento al usar Coca[employer]? Visite a su mdico o a su profesional de la salud para chequear su evolucin peridicamente. Su mdico o su profesional de Art therapist [employer] programados para asegurarse de que el medicamento est actuando correctamente. Qu efectos secundarios puedo tener al [employer] este medicamento? Efectos secundarios que debe informar a su mdico o a Barrister's clerk de la salud tan pronto como sea posible: -Chief of Staff como erupcin cutnea, picazn o urticarias, hinchazn de la cara, labios o lengua -decoloracin azulada de los labios, uas o palma de la mano -problemas respiratorios -aumento de sudoracin -color amarillento de los ojos o la piel cuando este medicamento se administra a los bebs recin nacidos Efectos secundarios que, por lo general, no requieren Geophysical data processor (debe informarlos a su mdico o a su profesional de la salud si  persisten o si son molestos): -cambios en el  sentido del gusto -mareos -enrojecimiento de la cara -dolor, inflamacin o hinchazn en la zona de la inyeccin Puede ser que esta lista no menciona todos los posibles efectos secundarios. Comunquese a su mdico por asesoramiento mdico [employer]. Usted puede informar los efectos secundarios a la FDA por telfono al 1-800-FDA-1088. Dnde debo guardar mi medicina? Mantngala fuera del alcance de los nios. [place]a a [employer], entre 15 y 44 grados C (40 y 32 grados F). No la congele. Protjala de la luz. [place] en un envase bien cerrado y en su caja original [place] que haya utilizado todo el [place]. Deseche todo el medicamento que no haya utilizado, despus de la fecha de vencimiento. ATENCIN: Este folleto es un resumen. Puede ser que no cubra toda la posible informacin. Si usted tiene preguntas acerca de esta medicina, consulte con su mdico, su farmacutico o su profesional de Technical sales engineer.    2016, Elsevier/Gold Standard. (2014-07-02 00:00:00)

## 2015-06-12 NOTE — Telephone Encounter (Signed)
Per desk nurse cxd f/u for 1/20 - pt will have lab/inj only. Per 1/19 pof added lab/inj for 1/20 - inj for 1/21 and lab/LT 1/23. Left message for patient and added comment to today's appontment for lab/inj to send  Patient for new schedule.

## 2015-06-12 NOTE — Telephone Encounter (Signed)
Pt brought in for Vit K, per Dr. Benay Spice. Will repeat lab 1/20 to determine if we need to repeat Vit K that day. Pt to get a new calendar while in the office today.

## 2015-06-13 ENCOUNTER — Ambulatory Visit: Payer: Medicare Other

## 2015-06-13 ENCOUNTER — Other Ambulatory Visit (HOSPITAL_BASED_OUTPATIENT_CLINIC_OR_DEPARTMENT_OTHER): Payer: Medicare Other

## 2015-06-13 ENCOUNTER — Ambulatory Visit: Payer: Medicare Other | Admitting: Nurse Practitioner

## 2015-06-13 ENCOUNTER — Other Ambulatory Visit: Payer: Medicare Other

## 2015-06-13 VITALS — BP 126/63 | HR 80 | Temp 98.3°F

## 2015-06-13 DIAGNOSIS — D689 Coagulation defect, unspecified: Secondary | ICD-10-CM | POA: Diagnosis not present

## 2015-06-13 DIAGNOSIS — C189 Malignant neoplasm of colon, unspecified: Secondary | ICD-10-CM | POA: Diagnosis not present

## 2015-06-13 DIAGNOSIS — E876 Hypokalemia: Secondary | ICD-10-CM

## 2015-06-13 LAB — PROTIME-INR
INR: 2.9 (ref 2.00–3.50)
PROTIME: 34.8 s — AB (ref 10.6–13.4)

## 2015-06-13 MED ORDER — PHYTONADIONE 5 MG PO TABS
10.0000 mg | ORAL_TABLET | Freq: Once | ORAL | Status: AC
  Administered 2015-06-13: 10 mg via ORAL
  Filled 2015-06-13: qty 2

## 2015-06-13 NOTE — Progress Notes (Signed)
PT reviewed by Dr. Benay Spice: Order received for Vitamin K 10 mg today and tomorrow. Pt aware of office visit Monday.

## 2015-06-14 ENCOUNTER — Ambulatory Visit: Payer: Medicare Other

## 2015-06-14 VITALS — BP 112/58 | HR 84 | Temp 98.0°F | Resp 16

## 2015-06-14 DIAGNOSIS — D689 Coagulation defect, unspecified: Secondary | ICD-10-CM

## 2015-06-14 LAB — FACTOR 7 ASSAY: FACTOR VII ACTIVITY: 12 % — AB (ref 51–186)

## 2015-06-14 MED ORDER — PHYTONADIONE 5 MG PO TABS
10.0000 mg | ORAL_TABLET | Freq: Once | ORAL | Status: AC
  Administered 2015-06-14: 10 mg via ORAL

## 2015-06-14 NOTE — Patient Instructions (Signed)
Phytonadione, Vitamin K1 tablets What is this medicine? PHYTONADIONE (fye toe na DYE one) is a man-made form of vitamin K. This medicine is used to treat vitamin K deficiency or bleeding problems caused by various disorders. This medicine may be used for other purposes; ask your health care provider or pharmacist if you have questions. What should I tell my health care provider before I take this medicine? They need to know if you have any of these conditions: -liver disease -an unusual or allergic reaction to phytonadione, other medicine, foods, dyes, or preservatives -pregnant or trying to get pregnant -breast-feeding How should I use this medicine? Take this medicine by mouth with a glass of water. Follow the directions on the prescription label. Take your doses at regular intervals. Do not take your medicine more often than directed. Talk to your pediatrician regarding the use of this medicine in children. While this drug may be prescribed for selected conditions, precautions do apply. Overdosage: If you think you have taken too much of this medicine contact a poison control center or emergency room at once. NOTE: This medicine is only for you. Do not share this medicine with others. What if I miss a dose? If you miss a dose, take it as soon as you can. If it is almost time for your next dose, take only that dose. Do not take double or extra doses. What may interact with this medicine? -cholestyramine -colestipol -warfarin This list may not describe all possible interactions. Give your health care provider a list of all the medicines, herbs, non-prescription drugs, or dietary supplements you use. Also tell them if you smoke, drink alcohol, or use illegal drugs. Some items may interact with your medicine. What should I watch for while using this medicine? Visit your doctor or health care professional for regular checks on your progress. Your doctor or health care professional will schedule  tests to make sure the medicine is working properly. What side effects may I notice from receiving this medicine? Side effects that you should report to your doctor or health care professional as soon as possible: -allergic reactions like skin rash, itching or hives, swelling of the face, lips, or tongue -bluish discoloration of lips, fingernails, or palms of hands -increased sweating -shortness of breath Side effects that usually do not require medical attention (report to your doctor or health care professional if they continue or are bothersome): -changes in taste -dizziness -flushing of the face This list may not describe all possible side effects. Call your doctor for medical advice about side effects. You may report side effects to FDA at 1-800-FDA-1088. Where should I keep my medicine? Keep out of the reach of children. Store at room temperature between 15 and 30 degrees C (59 and 86 degrees F). Protect from light. Store in tightly closed container and original carton until contents have been used. Throw away any unused medicine after the expiration date. NOTE: This sheet is a summary. It may not cover all possible information. If you have questions about this medicine, talk to your doctor, pharmacist, or health care provider.    2016, Elsevier/Gold Standard. (2007-12-11 17:26:46)

## 2015-06-16 ENCOUNTER — Ambulatory Visit (HOSPITAL_BASED_OUTPATIENT_CLINIC_OR_DEPARTMENT_OTHER): Payer: Medicare Other | Admitting: Nurse Practitioner

## 2015-06-16 ENCOUNTER — Other Ambulatory Visit (HOSPITAL_BASED_OUTPATIENT_CLINIC_OR_DEPARTMENT_OTHER): Payer: Medicare Other

## 2015-06-16 ENCOUNTER — Telehealth: Payer: Self-pay | Admitting: Oncology

## 2015-06-16 ENCOUNTER — Telehealth: Payer: Self-pay

## 2015-06-16 VITALS — BP 108/88 | HR 88 | Temp 98.5°F | Resp 18 | Ht 59.75 in | Wt 105.0 lb

## 2015-06-16 DIAGNOSIS — D682 Hereditary deficiency of other clotting factors: Secondary | ICD-10-CM

## 2015-06-16 DIAGNOSIS — Z85038 Personal history of other malignant neoplasm of large intestine: Secondary | ICD-10-CM

## 2015-06-16 DIAGNOSIS — M545 Low back pain: Secondary | ICD-10-CM

## 2015-06-16 DIAGNOSIS — F329 Major depressive disorder, single episode, unspecified: Secondary | ICD-10-CM

## 2015-06-16 DIAGNOSIS — R1900 Intra-abdominal and pelvic swelling, mass and lump, unspecified site: Secondary | ICD-10-CM

## 2015-06-16 DIAGNOSIS — D689 Coagulation defect, unspecified: Secondary | ICD-10-CM

## 2015-06-16 DIAGNOSIS — C180 Malignant neoplasm of cecum: Secondary | ICD-10-CM

## 2015-06-16 DIAGNOSIS — R109 Unspecified abdominal pain: Secondary | ICD-10-CM | POA: Diagnosis not present

## 2015-06-16 LAB — PROTIME-INR
INR: 2.3 (ref 2.00–3.50)
Protime: 27.6 Seconds — ABNORMAL HIGH (ref 10.6–13.4)

## 2015-06-16 MED ORDER — PHYTONADIONE 5 MG PO TABS
10.0000 mg | ORAL_TABLET | Freq: Once | ORAL | Status: AC
  Administered 2015-06-16: 10 mg via ORAL
  Filled 2015-06-16: qty 2

## 2015-06-16 NOTE — Progress Notes (Addendum)
[facility] OFFICE PROGRESS NOTE   Diagnosis: Colon cancer   INTERVAL HISTORY:   Susan Reed returns as scheduled. She continues to have low abdominal and back pain. She is taking tramadol with good relief. Appetite is poor. She complains of pelvic pressure with incontinence of urine. She also has the sensation of needing to have a bowel movement frequently. No bleeding. She is up with a walker.  Objective:  Vital signs in last 24 hours:  Blood pressure 108/88, pulse 88, temperature 98.5 F (36.9 C), temperature source Oral, resp. rate 18, height 4' 11.75" (1.518 m), weight 105 lb (47.628 kg), SpO2 98 %.    HEENT: No thrush or ulcers. Resp: Lungs clear bilaterally. Cardio: Regular rate and rhythm. GI: No hepatomegaly. Large mass filling the pelvis. Vascular: Trace bilateral lower leg edema.    Lab Results:  Lab Results  Component Value Date   WBC 16.0* 06/04/2015   HGB 12.5 06/04/2015   HCT 38.7 06/04/2015   MCV 92.4 06/04/2015   PLT 363 06/04/2015   NEUTROABS 10.6* 05/21/2015    Imaging:  No results found.  Medications: I have reviewed the patient's current medications.  Assessment/Plan: 1. Stage IIIc (T4 N2) poorly differentiated adenocarcinoma of the cecum status post right colectomy 06/24/2011. She began adjuvant Xeloda chemotherapy on 07/26/2011. She completed the eighth and final cycle beginning 12/21/2011. 2. History of anemia. Question if related to GI bleeding at presentation and then chemotherapy. 3. COPD. 4. Family history multiple cancers. 5. History of anorexia/weight loss.  6. Report of balance difficulty and numbness in the feet when here 11/24/2011. Vtamin B 12 level in low normal range on 12/20/2011. Repeat level today was normal on 01/18/2012. 7. History of hand-foot syndrome secondary to Xeloda. Resolved. 8. History of vertigo. 9. Depression-she was started on a trial of Remeron following office visit 02/21/2012 with significant  improvement. She is currently taking Remeron 7.5 mg daily. 10. Mildly elevated CEA July and August 2013-? Etiology. The CEA returned normal in December of 2013. ? related to chronic bronchitis or other inflammation. 11. CT abdomen/pelvis 05/12/2015 revealed a large pelvic mass, apparently arising from the uterus 12. Elevated prothrombin time; factor VII decreased. Received vitamin K 10 mg 06/12/2015, 06/13/2015 and 06/14/2015; PT/INR improved 06/16/2015 13. 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 Baseline normal PT/INR 05/26/2011.    Disposition: The PT has improved with vitamin K. The factor VII deficiency is likely related to her nutritional status. We found baseline normal labs from 05/26/2011. Dr. Benay Spice recommends vitamin K 10 mg by mouth today and tomorrow. We will repeat the PT/INR on 06/19/2015. We made a referral to schedule an ultrasound biopsy of the pelvic mass 06/20/2015 pending further improvement in the PT/INR. She will return for a follow-up visit on 06/24/2015 to review the results.  Patient seen with Dr. Benay Spice. 25 minutes were spent face-to-face at today's visit with the majority of that time involved in counseling/coordination of care.    Ned Card ANP/GNP-BC   06/16/2015  2:22 PM  This was a shared visit with Ned Card.  Susan Reed for main symptomatic from the large pelvic mass. The PT/INR has partially corrected with vitamin K suggesting the coagulopathy is related to malnutrition. She will be treated with additional vitamin K and return for a PT/INR on 06/19/2015. We will arrange for a biopsy if the PT/INR is further improved.  Susan Reed may be a candidate for palliative chemotherapy or radiation pending the pathologic findings.  Julieanne Manson,  M.D.

## 2015-06-16 NOTE — Telephone Encounter (Signed)
As per Owens Shark NP Phytonadione (vitamin K) 10 mg was ordered for Ms.Chock,Pt. Tolerated medication well,no s/s of medication reaction noted.

## 2015-06-16 NOTE — Telephone Encounter (Signed)
Pt confirmed appt received avs ° °

## 2015-06-17 ENCOUNTER — Ambulatory Visit (HOSPITAL_BASED_OUTPATIENT_CLINIC_OR_DEPARTMENT_OTHER): Payer: Medicare Other

## 2015-06-17 ENCOUNTER — Other Ambulatory Visit: Payer: Self-pay | Admitting: Nurse Practitioner

## 2015-06-17 ENCOUNTER — Encounter: Payer: Self-pay | Admitting: *Deleted

## 2015-06-17 DIAGNOSIS — D682 Hereditary deficiency of other clotting factors: Secondary | ICD-10-CM

## 2015-06-17 MED ORDER — PHYTONADIONE 5 MG PO TABS
10.0000 mg | ORAL_TABLET | Freq: Once | ORAL | Status: AC
  Administered 2015-06-17: 10 mg via ORAL
  Filled 2015-06-17: qty 2

## 2015-06-20 ENCOUNTER — Other Ambulatory Visit: Payer: Self-pay | Admitting: *Deleted

## 2015-06-20 ENCOUNTER — Other Ambulatory Visit: Payer: Self-pay | Admitting: Gynecologic Oncology

## 2015-06-20 DIAGNOSIS — D689 Coagulation defect, unspecified: Secondary | ICD-10-CM

## 2015-06-20 DIAGNOSIS — E876 Hypokalemia: Secondary | ICD-10-CM

## 2015-06-20 MED ORDER — TRAMADOL HCL 50 MG PO TABS: 50.0000 mg | ORAL_TABLET | Freq: Four times a day (QID) | ORAL | Status: AC | PRN

## 2015-06-20 NOTE — Telephone Encounter (Signed)
Call from pt's daughter requesting Tramadol refill. She also wants to be sure pt doesn't need Vit K prior to 2/1 biopsy. Next office visit scheduled 1/31. Reviewed with Dr. Benay Spice: Will check lab as scheduled.

## 2015-06-24 ENCOUNTER — Other Ambulatory Visit: Payer: Self-pay | Admitting: Radiology

## 2015-06-24 ENCOUNTER — Ambulatory Visit: Payer: Medicare Other | Admitting: Nurse Practitioner

## 2015-06-24 ENCOUNTER — Telehealth: Payer: Self-pay | Admitting: Nurse Practitioner

## 2015-06-24 ENCOUNTER — Inpatient Hospital Stay (HOSPITAL_COMMUNITY)
Admission: EM | Admit: 2015-06-24 | Discharge: 2015-06-26 | DRG: 270 | Disposition: A | Discharge status: Hospice | Payer: Medicare Other | Attending: Surgery | Admitting: Surgery

## 2015-06-24 ENCOUNTER — Other Ambulatory Visit (HOSPITAL_BASED_OUTPATIENT_CLINIC_OR_DEPARTMENT_OTHER): Payer: Medicare Other

## 2015-06-24 VITALS — BP 136/63 | HR 86 | Temp 97.1°F | Resp 17 | Ht 59.75 in | Wt 107.4 lb

## 2015-06-24 DIAGNOSIS — K644 Residual hemorrhoidal skin tags: Secondary | ICD-10-CM

## 2015-06-24 DIAGNOSIS — M810 Age-related osteoporosis without current pathological fracture: Secondary | ICD-10-CM | POA: Diagnosis present

## 2015-06-24 DIAGNOSIS — R1900 Intra-abdominal and pelvic swelling, mass and lump, unspecified site: Secondary | ICD-10-CM

## 2015-06-24 DIAGNOSIS — R159 Full incontinence of feces: Secondary | ICD-10-CM | POA: Diagnosis present

## 2015-06-24 DIAGNOSIS — R200 Anesthesia of skin: Secondary | ICD-10-CM | POA: Diagnosis present

## 2015-06-24 DIAGNOSIS — K5900 Constipation, unspecified: Secondary | ICD-10-CM

## 2015-06-24 DIAGNOSIS — Z9221 Personal history of antineoplastic chemotherapy: Secondary | ICD-10-CM

## 2015-06-24 DIAGNOSIS — D682 Hereditary deficiency of other clotting factors: Secondary | ICD-10-CM | POA: Diagnosis present

## 2015-06-24 DIAGNOSIS — E860 Dehydration: Secondary | ICD-10-CM | POA: Diagnosis present

## 2015-06-24 DIAGNOSIS — Z79818 Long term (current) use of other agents affecting estrogen receptors and estrogen levels: Secondary | ICD-10-CM | POA: Diagnosis not present

## 2015-06-24 DIAGNOSIS — F329 Major depressive disorder, single episode, unspecified: Secondary | ICD-10-CM | POA: Diagnosis present

## 2015-06-24 DIAGNOSIS — J449 Chronic obstructive pulmonary disease, unspecified: Secondary | ICD-10-CM

## 2015-06-24 DIAGNOSIS — I745 Embolism and thrombosis of iliac artery: Principal | ICD-10-CM

## 2015-06-24 DIAGNOSIS — F419 Anxiety disorder, unspecified: Secondary | ICD-10-CM

## 2015-06-24 DIAGNOSIS — R1030 Lower abdominal pain, unspecified: Secondary | ICD-10-CM | POA: Diagnosis not present

## 2015-06-24 DIAGNOSIS — D62 Acute posthemorrhagic anemia: Secondary | ICD-10-CM | POA: Diagnosis not present

## 2015-06-24 DIAGNOSIS — E785 Hyperlipidemia, unspecified: Secondary | ICD-10-CM | POA: Diagnosis present

## 2015-06-24 DIAGNOSIS — I10 Essential (primary) hypertension: Secondary | ICD-10-CM

## 2015-06-24 DIAGNOSIS — E43 Unspecified severe protein-calorie malnutrition: Secondary | ICD-10-CM | POA: Diagnosis present

## 2015-06-24 DIAGNOSIS — C55 Malignant neoplasm of uterus, part unspecified: Secondary | ICD-10-CM

## 2015-06-24 DIAGNOSIS — I743 Embolism and thrombosis of arteries of the lower extremities: Secondary | ICD-10-CM | POA: Diagnosis present

## 2015-06-24 DIAGNOSIS — R32 Unspecified urinary incontinence: Secondary | ICD-10-CM | POA: Diagnosis present

## 2015-06-24 DIAGNOSIS — H409 Unspecified glaucoma: Secondary | ICD-10-CM

## 2015-06-24 DIAGNOSIS — G893 Neoplasm related pain (acute) (chronic): Secondary | ICD-10-CM | POA: Diagnosis not present

## 2015-06-24 DIAGNOSIS — Z85038 Personal history of other malignant neoplasm of large intestine: Secondary | ICD-10-CM | POA: Diagnosis not present

## 2015-06-24 DIAGNOSIS — Z7982 Long term (current) use of aspirin: Secondary | ICD-10-CM | POA: Diagnosis not present

## 2015-06-24 DIAGNOSIS — C180 Malignant neoplasm of cecum: Secondary | ICD-10-CM | POA: Diagnosis not present

## 2015-06-24 DIAGNOSIS — Z6822 Body mass index (BMI) 22.0-22.9, adult: Secondary | ICD-10-CM | POA: Diagnosis not present

## 2015-06-24 DIAGNOSIS — E039 Hypothyroidism, unspecified: Secondary | ICD-10-CM | POA: Diagnosis present

## 2015-06-24 DIAGNOSIS — I998 Other disorder of circulatory system: Secondary | ICD-10-CM | POA: Diagnosis not present

## 2015-06-24 DIAGNOSIS — D689 Coagulation defect, unspecified: Secondary | ICD-10-CM | POA: Diagnosis not present

## 2015-06-24 DIAGNOSIS — R109 Unspecified abdominal pain: Secondary | ICD-10-CM | POA: Diagnosis not present

## 2015-06-24 DIAGNOSIS — R609 Edema, unspecified: Secondary | ICD-10-CM | POA: Diagnosis not present

## 2015-06-24 LAB — PROTIME-INR
INR: 3.4 (ref 2.00–3.50)
PROTIME: 40.8 s — AB (ref 10.6–13.4)

## 2015-06-24 MED ORDER — MIRTAZAPINE 15 MG PO TABS
7.5000 mg | ORAL_TABLET | Freq: Every day | ORAL | Status: DC
  Administered 2015-06-24 – 2015-06-25 (×2): 7.5 mg via ORAL
  Filled 2015-06-24 (×2): qty 1

## 2015-06-24 MED ORDER — SODIUM CHLORIDE 0.9 % IV SOLN
INTRAVENOUS | Status: DC
  Administered 2015-06-25 – 2015-06-26 (×2): via INTRAVENOUS

## 2015-06-24 MED ORDER — LEVOTHYROXINE SODIUM 25 MCG PO TABS
75.0000 ug | ORAL_TABLET | Freq: Every day | ORAL | Status: DC
  Administered 2015-06-25 – 2015-06-26 (×2): 75 ug via ORAL
  Filled 2015-06-24 (×2): qty 1

## 2015-06-24 MED ORDER — CYCLOSPORINE 0.05 % OP EMUL
1.0000 [drp] | Freq: Two times a day (BID) | OPHTHALMIC | Status: DC
  Administered 2015-06-24 – 2015-06-26 (×3): 1 [drp] via OPHTHALMIC
  Filled 2015-06-24 (×5): qty 1

## 2015-06-24 MED ORDER — TRAMADOL HCL 50 MG PO TABS
50.0000 mg | ORAL_TABLET | Freq: Four times a day (QID) | ORAL | Status: DC | PRN
  Administered 2015-06-24 – 2015-06-26 (×6): 50 mg via ORAL
  Filled 2015-06-24 (×6): qty 1

## 2015-06-24 MED ORDER — VITAMIN K1 10 MG/ML IJ SOLN
10.0000 mg | Freq: Three times a day (TID) | INTRAMUSCULAR | Status: AC
  Administered 2015-06-24 – 2015-06-25 (×4): 10 mg via SUBCUTANEOUS
  Filled 2015-06-24 (×5): qty 1

## 2015-06-24 MED ORDER — SODIUM CHLORIDE 0.9 % IV SOLN
INTRAVENOUS | Status: DC
Start: 2015-06-25 — End: 2015-06-26

## 2015-06-24 MED ORDER — DORZOLAMIDE HCL-TIMOLOL MAL 2-0.5 % OP SOLN
1.0000 [drp] | Freq: Two times a day (BID) | OPHTHALMIC | Status: DC
  Administered 2015-06-24 – 2015-06-26 (×3): 1 [drp] via OPHTHALMIC
  Filled 2015-06-24: qty 10

## 2015-06-24 NOTE — H&P (Signed)
Admission History and Physical      Chief Complaint: Abdominal pain related to a pelvic mass HPI: [name] is an 82 year old woman with a history of stage IIIc poorly differentiated adenocarcinoma of the cecum status post right colectomy 06/24/2011. She completed 8 cycles of adjuvant Xeloda chemotherapy 07/26/2011 through 12/21/2011. CT of the abdomen/pelvis on 05/12/2015 showed a large pelvic mass apparently arising from the uterus. She has been referred for a biopsy of the mass. Due to an elevated prothrombin time the biopsy has not occurred. 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 She was given oral vitamin K with improvement.   Of note, she had a normal PT/INR on 05/26/2011.  She is seen in the office today for scheduled follow-up. She is scheduled for the pelvic mass biopsy tomorrow. The PT/INR is again elevated--40.8/3.40.  She continues to have lower abdominal pain. She takes Ultram as needed with partial relief. Appetite overall is poor. She is drinking nutritional supplements. She continues to feel the need for frequent urination and defecation. She is intermittently incontinent of urine and stool. No bleeding. No nausea or vomiting. No fever. She notes increased bilateral leg edema.    Past Medical History  Diagnosis Date  . Anemia   . COPD (chronic obstructive pulmonary disease) ([place])   . Hyperlipidemia   . Osteoporosis   . Hypertension   . Glaucoma   . Hypothyroidism   . Blood transfusion     2009  . Colon cancer ([place])     colon cancer   . Rectal bleeding   . Abdominal bloating   . Allergy 08/09/12    codeine and lactose  . Anxiety   . Depression   . Blood transfusion without reported diagnosis 2009    5 years ago for anemia  . Cataract     Past Surgical History  Procedure Laterality Date  . Tonsillectomy    . Right colectomy  2013  . Band hemorrhoidectomy      by Dr. Harlow Asa  .  Cataract extraction w/ intraocular lens implant Right 2008  . Colon surgery      Medications Prior to Admission  Medication Sig Dispense Refill  . aspirin 81 MG tablet Take 81 mg by mouth daily with breakfast. Reported on 06/11/2015    . Calcium Carbonate-Vit D-Min (CALTRATE PLUS PO) Take 1 tablet by mouth daily.     . Cholecalciferol (VITAMIN D-3 PO) Take 1,000 Units by mouth daily.     . cycloSPORINE (RESTASIS) 0.05 % ophthalmic emulsion Place 1 drop into both eyes 2 (two) times daily.     Marland Kitchen dicyclomine (BENTYL) 10 MG capsule Take 1 capsule by mouth 4 (four) times daily -  before meals and at bedtime. Reported on 05/30/2015    . dorzolamide-timolol (COSOPT) 22.3-6.8 MG/ML ophthalmic solution Place 1 drop into both eyes 2 (two) times daily.     . fish oil-omega-3 fatty acids 1000 MG capsule Take 1 g by mouth daily. Reported on 06/11/2015    . levothyroxine (SYNTHROID, LEVOTHROID) 75 MCG tablet Take 75 mcg by mouth daily before breakfast.    . loperamide (IMODIUM) 2 MG capsule Take 2 mg by mouth as needed. Reported on 05/30/2015    . mirtazapine (REMERON) 7.5 MG tablet Take 1 tablet (7.5 mg total) by mouth at bedtime. 30 tablet 2  . Multiple Vitamin (MULTIVITAMIN) capsule Take 1 capsule by mouth daily.    . phytonadione (VITAMIN K) 5 MG tablet  Take 2 tablets (10 mg total) by mouth 2 (two) times daily. For 1/18 and 1/19 only 8 tablet 0  . TOBRADEX ophthalmic ointment Apply to eye daily as needed.    . traMADol (ULTRAM) 50 MG tablet Take 1 tablet (50 mg total) by mouth every 6 (six) hours as needed. 30 tablet 0  . Vitamins A & D (VITAMIN A & D) 10000-400 UNITS CAPS Reported on 05/30/2015     Scheduled Meds: . cycloSPORINE  1 drop Both Eyes BID  . dorzolamide-timolol  1 drop Both Eyes BID  . [START ON 06/25/2015] levothyroxine  75 mcg Oral QAC breakfast  . mirtazapine  7.5 mg Oral QHS  . phytonadione  10 mg Subcutaneous 3 times per day   Continuous Infusions: . [START ON 06/25/2015] sodium chloride     . sodium chloride     PRN Meds:.traMADol  Allergies  Allergen Reactions  . Codeine Nausea And Vomiting  . Lactose Intolerance (Gi)     Family History  Problem Relation Age of Onset  . Heart disease Father   . Cancer Sister     pt unaware of what kind, but knows it was female reproductive area  . Cancer Brother     lymphoma  . Colon cancer Neg Hx      reports that she has never smoked. She has never used smokeless tobacco. She reports that she does not drink alcohol or use illicit drugs.  ROS:  Physical:  Blood pressure 123/53, pulse 84, temperature 97.9 F (36.6 C), temperature source Oral, resp. rate 16, height 4\' 11"  (1.499 m), weight 107 lb (48.535 kg), SpO2 97 %.  General: Chronically ill-appearing female. She appears uncomfortable. HEENT: No thrush or ulcers. Mouth appears dry. Chest: Lungs clear bilaterally. Cardiovascular: Regular rate and rhythm. Abdomen: Large mass filling the pelvis with associated tenderness. No hepatomegaly. Extremities: Pitting edema at the lower legs bilaterally. Neuro: Alert and oriented. Follows commands. Moves all extremities.  Labs:  Results for orders placed or performed in visit on 06/24/15 (from the past 48 hour(s))  Protime-INR     Status: Abnormal   Collection Time: 06/24/15  2:39 PM  Result Value Ref Range   Protime 40.8 (H) 10.6 - 13.4 Seconds   INR 3.40 2.00 - 3.50    Comment: INR is useful only to assess adequacy of anticoagulation with coumadin when comparing results from different labs. It should not be used to estimate bleeding risk or presence/abscense of coagulopathy in patients not on coumadin. Expected INR ranges for  nontherapeutic patients is 0.88 - 1.12.    Lovenox No    No results found.  Assessment/Plan  1. Stage IIIc (T4 N2) poorly differentiated adenocarcinoma of the cecum status post right colectomy 06/24/2011. She began adjuvant Xeloda chemotherapy on 07/26/2011. She completed the eighth and final  cycle beginning 12/21/2011. 2. History of anemia. Question if related to GI bleeding at presentation and then chemotherapy. 3. COPD. 4. Family history multiple cancers. 5. History of anorexia/weight loss.  6. Report of balance difficulty and numbness in the feet when here 11/24/2011. Vtamin B 12 level in low normal range on 12/20/2011. Repeat level today was normal on 01/18/2012. 7. History of hand-foot syndrome secondary to Xeloda. Resolved. 8. History of vertigo. 9. Depression-she was started on a trial of Remeron following office visit 02/21/2012 with significant improvement. She is currently taking Remeron 7.5 mg daily. 10. Mildly elevated CEA July and August 2013-? Etiology. The CEA returned normal in December of 2013. ?  related to chronic bronchitis or other inflammation. 11. CT abdomen/pelvis 05/12/2015 revealed a large pelvic mass, apparently arising from the uterus 12. Elevated prothrombin time; factor VII decreased. Received vitamin K 10 mg 06/12/2015, 06/13/2015 and 06/14/2015; PT/INR improved 06/16/2015 13. 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 Baseline normal PT/INR 05/26/2011.   Ms. Offutt has a large pelvic mass of unclear etiology. She has been referred for a biopsy. The biopsy has not taken place due to an elevated PT/INR. She was found to have factor VII deficiency felt to likely be related to malnutrition. The PT/INR improved with vitamin K.  Unfortunately, the PT/INR is again elevated. She is scheduled for the biopsy tomorrow.  She remains symptomatic from the pelvic mass with pain and urinary and fecal incontinence. She appears dehydrated. She will be admitted to the hospital for intravenous hydration. She will receive subcutaneous vitamin K 10 mg every 8 hours 4 doses; continue tramadol as needed for pain. We will repeat the PT/INR in the morning and consider FFP if needed prior to the biopsy.  Ned Card ANP/GNP-BC 06/24/2015, 4:57 PM  This was a  shared visit with Ned Card. Ms. Curnutte was interviewed and examined. She has an acquired coagulopathy, likely secondary to malnutrition. The PT/INR has not been corrected as an outpatient. She will be admitted to receive additional vitamin K and affect the PT as needed prior to a diagnostic biopsy.  Ms. Hawkin has declined significantly over the past few weeks. We will therefore admit her for an expedient biopsy and supportive care measures.

## 2015-06-24 NOTE — Telephone Encounter (Signed)
As per [place] admitted to 3 W room 1338 under Dr.Sherrill. Pt. Transported to room. Report given to [facility] receiving nurse.

## 2015-06-24 NOTE — Progress Notes (Signed)
Patient admitted to [facility]. See admission history and physical for details.

## 2015-06-25 ENCOUNTER — Telehealth: Payer: Self-pay | Admitting: *Deleted

## 2015-06-25 ENCOUNTER — Ambulatory Visit (HOSPITAL_COMMUNITY): Payer: Medicare Other

## 2015-06-25 ENCOUNTER — Encounter (HOSPITAL_COMMUNITY): Payer: Self-pay

## 2015-06-25 ENCOUNTER — Other Ambulatory Visit: Payer: Self-pay | Admitting: Oncology

## 2015-06-25 ENCOUNTER — Ambulatory Visit (HOSPITAL_COMMUNITY)
Admission: RE | Admit: 2015-06-25 | Discharge: 2015-06-25 | Disposition: A | Discharge status: Home / Self Care | Payer: Medicare Other | Source: Ambulatory Visit | Attending: Nurse Practitioner | Admitting: Nurse Practitioner

## 2015-06-25 ENCOUNTER — Ambulatory Visit (HOSPITAL_COMMUNITY)
Admission: AD | Admit: 2015-06-25 | Discharge: 2015-06-25 | Disposition: A | Discharge status: Home / Self Care | Payer: Medicare Other | Source: Ambulatory Visit | Attending: Nurse Practitioner | Admitting: Nurse Practitioner

## 2015-06-25 DIAGNOSIS — R1900 Intra-abdominal and pelvic swelling, mass and lump, unspecified site: Secondary | ICD-10-CM

## 2015-06-25 DIAGNOSIS — D682 Hereditary deficiency of other clotting factors: Secondary | ICD-10-CM

## 2015-06-25 DIAGNOSIS — C180 Malignant neoplasm of cecum: Secondary | ICD-10-CM

## 2015-06-25 LAB — CBC
HCT: 33.9 % — ABNORMAL LOW (ref 36.0–46.0)
HEMOGLOBIN: 11.2 g/dL — AB (ref 12.0–15.0)
MCH: 30.6 pg (ref 26.0–34.0)
MCHC: 33 g/dL (ref 30.0–36.0)
MCV: 92.6 fL (ref 78.0–100.0)
Platelets: 297 10*3/uL (ref 150–400)
RBC: 3.66 MIL/uL — AB (ref 3.87–5.11)
RDW: 17.7 % — ABNORMAL HIGH (ref 11.5–15.5)
WBC: 17.9 10*3/uL — AB (ref 4.0–10.5)

## 2015-06-25 LAB — PROTIME-INR
INR: 1.41 (ref 0.00–1.49)
INR: 1.64 — ABNORMAL HIGH (ref 0.00–1.49)
PROTHROMBIN TIME: 17.4 s — AB (ref 11.6–15.2)
Prothrombin Time: 19.4 seconds — ABNORMAL HIGH (ref 11.6–15.2)

## 2015-06-25 LAB — APTT: aPTT: 33 seconds (ref 24–37)

## 2015-06-25 MED ORDER — POLYETHYLENE GLYCOL 3350 17 G PO PACK: 17.0000 g | PACK | Freq: Every day | ORAL | Status: DC

## 2015-06-25 MED ORDER — FENTANYL CITRATE (PF) 100 MCG/2ML IJ SOLN
INTRAMUSCULAR | Status: AC
  Filled 2015-06-25: qty 2

## 2015-06-25 MED ORDER — MIDAZOLAM HCL 2 MG/2ML IJ SOLN
INTRAMUSCULAR | Status: AC
  Filled 2015-06-25: qty 2

## 2015-06-25 MED ORDER — SENNOSIDES-DOCUSATE SODIUM 8.6-50 MG PO TABS
1.0000 | ORAL_TABLET | Freq: Two times a day (BID) | ORAL | Status: DC
  Filled 2015-06-25: qty 1

## 2015-06-25 MED ORDER — MIDAZOLAM HCL 2 MG/2ML IJ SOLN
INTRAMUSCULAR | Status: AC | PRN
  Administered 2015-06-25: 0.5 mg via INTRAVENOUS

## 2015-06-25 MED ORDER — FENTANYL CITRATE (PF) 100 MCG/2ML IJ SOLN
INTRAMUSCULAR | Status: AC | PRN
  Administered 2015-06-25: 25 ug via INTRAVENOUS

## 2015-06-25 MED ORDER — MAGIC MOUTHWASH
5.0000 mL | Freq: Four times a day (QID) | ORAL | Status: DC | PRN
  Filled 2015-06-25: qty 10

## 2015-06-25 MED ORDER — MIDAZOLAM HCL 2 MG/2ML IJ SOLN
INTRAMUSCULAR | Status: AC
  Filled 2015-06-25: qty 4

## 2015-06-25 NOTE — Progress Notes (Signed)
IP PROGRESS NOTE  Subjective:   She feels like she needs to have a bowel movement. Her mouth is sore.  Objective: Vital signs in last 24 hours: Blood pressure 136/53, pulse 99, temperature 98.4 F (36.9 C), temperature source Oral, resp. rate 16, height 4\' 11"  (1.499 m), weight 107 lb (48.535 kg), SpO2 96 %.  Intake/Output from previous day: 01/31 0701 - 02/01 0700 In: 120 [P.O.:120] Out: -   Physical Exam:  HEENT: Mild whitecoat over the tongue, no buccal thrush Lungs: Clear anteriorly Cardiac: Regular rate and rhythm Abdomen: Mass filling the lower abdomen Extremities: 1+ pitting edema at the lower leg bilaterally     Lab Results:  Recent Labs  06/25/15 0004  WBC 17.9*  HGB 11.2*  HCT 33.9*  PLT 297   PT 17.4 seconds, PTT 33   Medications: I have reviewed the patient's current medications.  Assessment/Plan:  1. Stage IIIc (T4 N2) poorly differentiated adenocarcinoma of the cecum status post right colectomy 06/24/2011. She began adjuvant Xeloda chemotherapy on 07/26/2011. She completed the eighth and final cycle beginning 12/21/2011. 2. History of anemia. Question if related to GI bleeding at presentation and then chemotherapy. 3. COPD. 4. Family history multiple cancers. 5. History of anorexia/weight loss.  6. Report of balance difficulty and numbness in the feet when here 11/24/2011. Vtamin B 12 level in low normal range on 12/20/2011. Repeat level today was normal on 01/18/2012. 7. History of hand-foot syndrome secondary to Xeloda. Resolved. 8. History of vertigo. 9. Depression-she was started on a trial of Remeron following office visit 02/21/2012 with significant improvement. She is currently taking Remeron 7.5 mg daily. 10. Mildly elevated CEA July and August 2013-? Etiology. The CEA returned normal in December of 2013. ? related to chronic bronchitis or other inflammation. 11. CT abdomen/pelvis 05/12/2015 revealed a large pelvic mass, apparently arising  from the uterus 12. Elevated prothrombin time; factor VII decreased. Received vitamin K 10 mg 06/12/2015, 06/13/2015 and 06/14/2015; PT/INR improved 06/16/2015, elevated PT/INR 06/24/2015-improved with additional vitamin K  The PT is lower this morning and appears adequate to proceed with a diagnostic biopsy of the pelvic mass. She will continue vitamin K.  We will continue supportive care measures with pain medication and she will begin a bowel regimen.  We will follow-up on the pathology from the biopsy to decide on palliative chemotherapy/radiation options.    LOS: 1 day   Susan Reed  06/25/2015, 8:51 AM

## 2015-06-25 NOTE — Procedures (Signed)
Pelvic mass bx 18 g times three No comp

## 2015-06-25 NOTE — H&P (Signed)
Chief Complaint: Patient was seen in consultation today for pelvic mass at the request of Dr. Benay Spice  Referring Physician(s): Dr. Benay Spice  History of Present Illness: Susan Reed is a 82 y.o. 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 She completed 8 cycles of adjuvant Xeloda chemotherapy 07/26/2011 through 12/21/2011.   CT of the abdomen/pelvis on 05/12/2015 showed a large pelvic mass apparently arising from the uterus.   She has been referred to [place] for a biopsy of the mass.   Due to an elevated prothrombin time, we had to put the biopsy off until today.  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 She was given oral vitamin K with improvement.   She was seen in Dr.  Gearldine Shown office yesterday. The PT/INR is again elevated--40.8/3.40.  He admitted her to expedite correction of her coagulopathy.  She was also found to be dehydrated from poor PO intake and she is receiving IV fluids currently.  She continues to have lower abdominal pain.   She takes Ultram as needed with partial relief. Appetite overall is poor. She is drinking nutritional supplements.   She is symptomatic from this mass as she feels the need for frequent urination and defecation. She is intermittently incontinent of urine and stool.   She feels OK today. She denies recent fever or chills.   Past Medical History  Diagnosis Date  . Anemia   . COPD (chronic obstructive pulmonary disease) ([place])   . Hyperlipidemia   . Osteoporosis   . Hypertension   . Glaucoma   . Hypothyroidism   . Blood transfusion     2009  . Colon cancer ([facility])     colon cancer   . Rectal bleeding   . Abdominal bloating   . Allergy 08/09/12    codeine and lactose  . Anxiety   . Depression   . Blood transfusion without reported diagnosis 2009    5 years ago for anemia    . Cataract     Past Surgical History  Procedure Laterality Date  . Tonsillectomy    . Right colectomy  2013  . Band hemorrhoidectomy      by Dr. Harlow Asa  . Cataract extraction w/ intraocular lens implant Right 2008  . Colon surgery      Allergies: Codeine and Lactose intolerance (gi)  Medications: Prior to Admission medications   Medication Sig Start Date End Date Taking? Authorizing Provider  Cholecalciferol (VITAMIN D-3 PO) Take 1,000 Units by mouth daily.     Historical Provider, MD  cycloSPORINE (RESTASIS) 0.05 % ophthalmic emulsion Place 1 drop into both eyes at bedtime.     Historical Provider, MD  dorzolamide-timolol (COSOPT) 22.3-6.8 MG/ML ophthalmic solution Place 1 drop into both eyes at bedtime.  02/11/12   Historical Provider, MD  latanoprost (XALATAN) 0.005 % ophthalmic solution INSTILL 1 DROP IN BOTH EYES AT BEDTIME 04/27/15   Historical Provider, MD  levothyroxine (SYNTHROID, LEVOTHROID) 50 MCG tablet TAKE 1 TABLET EVERY TUESDAY, THURSDAY, SATURDAY AND SUNDAY 04/28/15   Historical Provider, MD  levothyroxine (SYNTHROID, LEVOTHROID) 75 MCG tablet Take 75 mcg by mouth daily before breakfast.    Historical Provider, MD  loperamide (IMODIUM) 2 MG capsule Take 2 mg by mouth as needed. Reported on 05/30/2015    Historical Provider, MD  megestrol (MEGACE ES) 625 MG/5ML suspension TAKE 5 MLS (625 MG TOTAL) BY MOUTH DAILY. 05/12/15  Historical Provider, MD  mirtazapine (REMERON) 7.5 MG tablet Take 1 tablet (7.5 mg total) by mouth at bedtime. 03/04/15   Ladell Pier, MD  Multiple Vitamin (MULTIVITAMIN) capsule Take 1 capsule by mouth daily.    Historical Provider, MD  TOBRADEX ophthalmic ointment Apply 1 application to eye daily as needed (rash).  10/11/12   Historical Provider, MD  traMADol (ULTRAM) 50 MG tablet Take 1 tablet (50 mg total) by mouth every 6 (six) hours as needed. 06/20/15   Ladell Pier, MD  VITAMIN K PO Take 1 tablet by mouth daily.    Historical Provider, MD      Family History  Problem Relation Age of Onset  . Heart disease Father   . Cancer Sister     pt unaware of what kind, but knows it was female reproductive area  . Cancer Brother     lymphoma  . Colon cancer Neg Hx     Social History   Social History  . Marital Status: Widowed    Spouse Name: N/A  . Number of Children: 4  . Years of Education: N/A   Occupational History  . Retired     Social History Main Topics  . Smoking status: Never Smoker   . Smokeless tobacco: Never Used  . Alcohol Use: No     Comment: occ  . Drug Use: No  . Sexual Activity: Not Asked   Other Topics Concern  . None   Social History Narrative   Widow.  Originally from [place].  Lives in independent living.    Review of Systems: A 12 point ROS discussed.  Review of Systems  Constitutional: Positive for activity change, appetite change and fatigue. Negative for fever and chills.  HENT: Negative.   Respiratory: Negative for cough, chest tightness, shortness of breath and wheezing.   Cardiovascular: Negative for chest pain.  Gastrointestinal: Positive for abdominal pain and abdominal distention. Negative for nausea and vomiting.  Genitourinary: Negative.   Musculoskeletal: Negative.   Skin: Negative.   Neurological: Negative.   Psychiatric/Behavioral: Negative.     Vital Signs: BP 136/53 Pulse 99 Temp 98.4 Resp 16  Physical Exam  Constitutional: She is oriented to person, place, and time.  Thin, NAD  HENT:  Head: Normocephalic and atraumatic.  Eyes: EOM are normal.  Neck: Neck supple.  Cardiovascular: Normal rate, regular rhythm and normal heart sounds.   Pulmonary/Chest: Effort normal and breath sounds normal. No respiratory distress. She has no wheezes.  Abdominal:  Palpable mass, moderate tenderness to palpation  Musculoskeletal: Normal range of motion.  Neurological: She is alert and oriented to person, place, and time.  Skin: Skin is warm and dry.  Psychiatric: She has a  normal mood and affect. Her behavior is normal. Judgment and thought content normal.  Vitals reviewed.   Mallampati Score:  MD Evaluation Airway: WNL Heart: WNL Abdomen: WNL Chest/ Lungs: WNL ASA  Classification: 3 Mallampati/Airway Score: One  Imaging: No results found.  Labs:  CBC:  Recent Labs  05/21/15 1440 06/04/15 1037 06/25/15 0004  WBC 15.2* 16.0* 17.9*  HGB 12.0 12.5 11.2*  HCT 37.4 38.7 33.9*  PLT 378 363 297    COAGS:  Recent Labs  06/04/15 1037  06/04/15 1422  06/16/15 1355 06/24/15 1439 06/25/15 0004 06/25/15 0423  INR 3.44*  < >  --   < > 2.30 3.40 1.64* 1.41  APTT 36  --  33.2*  --   --   --  33  --   < > =  values in this interval not displayed.  BMP:  Recent Labs  05/21/15 1440 06/11/15 1225  NA 139 134*  K 3.0 Repeated and Verified* 3.4*  CO2 24 25  GLUCOSE 93 107  BUN 8.5 9.9  CALCIUM 8.4 8.0*  CREATININE 0.6 0.6    LIVER FUNCTION TESTS:  Recent Labs  05/21/15 1440  BILITOT 0.53  AST 10  ALT <9  ALKPHOS 81  PROT 6.5  ALBUMIN 2.8*    TUMOR MARKERS:  Recent Labs  11/18/14 1501 05/21/15 1440 06/11/15 1225  CEA 2.2 1.2 1.1    Assessment and Plan:  History of Cecal cancer Pelvic mass arising from the uterus. Coagulopathy secondary to malnutrition.  INR is 1.4 today  Will proceed with image guided biopsy today by Dr. Barbie [facility].  Risks and Benefits discussed with the patient including, but not limited to bleeding, infection, damage to adjacent structures or low yield requiring additional tests.  All of the patient's questions were answered, patient is agreeable to proceed. Consent signed and in chart.  Thank you for this interesting consult.  I greatly enjoyed meeting Susan Reed and look forward to participating in their care.  A copy of this report was sent to the requesting provider on this date.  Electronically Signed: Murrell Redden PA-C 06/25/2015, 1:19 PM   I spent a total of 40 Minutes in face to  face in clinical consultation, greater than 50% of which was counseling/coordinating care for US guided biopsy.

## 2015-06-25 NOTE — Telephone Encounter (Signed)
Message from Alinda Sierras, case manager requesting an order to change admission to observation. Reviewed with Ned Card, NP: Order left on voicemail for Alinda Sierras.

## 2015-06-26 ENCOUNTER — Other Ambulatory Visit: Payer: Self-pay | Admitting: Nurse Practitioner

## 2015-06-26 ENCOUNTER — Encounter (HOSPITAL_COMMUNITY): Payer: Self-pay | Admitting: *Deleted

## 2015-06-26 ENCOUNTER — Telehealth: Payer: Self-pay | Admitting: Nurse Practitioner

## 2015-06-26 DIAGNOSIS — R3915 Urgency of urination: Secondary | ICD-10-CM

## 2015-06-26 DIAGNOSIS — G893 Neoplasm related pain (acute) (chronic): Secondary | ICD-10-CM

## 2015-06-26 DIAGNOSIS — R152 Fecal urgency: Secondary | ICD-10-CM

## 2015-06-26 LAB — COMPREHENSIVE METABOLIC PANEL
ALBUMIN: 2.3 g/dL — AB (ref 3.5–5.0)
ALT: 7 U/L — ABNORMAL LOW (ref 14–54)
ANION GAP: 10 (ref 5–15)
AST: 13 U/L — AB (ref 15–41)
Alkaline Phosphatase: 99 U/L (ref 38–126)
BUN: 12 mg/dL (ref 6–20)
CHLORIDE: 104 mmol/L (ref 101–111)
CO2: 23 mmol/L (ref 22–32)
Calcium: 7.1 mg/dL — ABNORMAL LOW (ref 8.9–10.3)
Creatinine, Ser: 0.48 mg/dL (ref 0.44–1.00)
GFR calc Af Amer: >60 mL/min (ref >60)
GFR calc non Af Amer: >60 mL/min (ref >60)
GLUCOSE: 93 mg/dL (ref 65–99)
POTASSIUM: 3.3 mmol/L — AB (ref 3.5–5.1)
SODIUM: 137 mmol/L (ref 135–145)
Total Bilirubin: 0.9 mg/dL (ref 0.3–1.2)
Total Protein: 5 g/dL — ABNORMAL LOW (ref 6.5–8.1)

## 2015-06-26 LAB — PROTIME-INR
INR: 1.23 (ref 0.00–1.49)
Prothrombin Time: 15.7 seconds — ABNORMAL HIGH (ref 11.6–15.2)

## 2015-06-26 MED ORDER — LEVOTHYROXINE SODIUM 50 MCG PO TABS: 50.0000 ug | ORAL_TABLET | ORAL | Status: DC

## 2015-06-26 MED ORDER — LEVOTHYROXINE SODIUM 50 MCG PO TABS: 75.0000 ug | ORAL_TABLET | ORAL | Status: DC

## 2015-06-26 MED ORDER — CETYLPYRIDINIUM CHLORIDE 0.05 % MT LIQD: 7.0000 mL | Freq: Two times a day (BID) | OROMUCOSAL | Status: DC

## 2015-06-26 NOTE — Care Management Obs Status (Signed)
[place] NOTIFICATION   Patient Details  Name: Susan Reed MRN: [id number] Date of Birth: [birth year]   Medicare Observation Status Notification Given:  Yes    Lynnell Catalan, RN 06/26/2015, 12:01 PM

## 2015-06-26 NOTE — Telephone Encounter (Signed)
per pof to sch pt appt-cld & spoke to pt and sdaughter and gave pt time & date of appt

## 2015-06-26 NOTE — Discharge Summary (Signed)
Physician Discharge Summary  Patient ID: Susan Reed @[email or web address] @ MRN: [id number] DOB/AGE: [birth year] 82 y.o.  Admit date: 06/24/2015 Discharge date: 06/26/2015  Discharge Diagnoses:  1. Pelvic mass, status post a CT-guided biopsy 06/26/2015 2. Pain secondary to #1 3. Urinary/fecal urgency 4. Coagulopathy-improved with vitamin K 5. Stage IIIc adenocarcinoma of the cecum 06/24/2011, status post adjuvant Xeloda completed 12/21/2011 6. COPD 7. Depression  Discharged Condition: Stable  Discharge Labs:  PT 15.7 seconds  Significant Diagnostic Studies: None  Consults: None  Procedures:  CT-guided biopsy of pelvic mass  Disposition: 01-Home or Self Care     Medication List    STOP taking these medications        VITAMIN K PO      TAKE these medications        cycloSPORINE 0.05 % ophthalmic emulsion  Commonly known as:  RESTASIS  Place 1 drop into both eyes at bedtime.     dorzolamide-timolol 22.3-6.8 MG/ML ophthalmic solution  Commonly known as:  COSOPT  Place 1 drop into both eyes at bedtime.     latanoprost 0.005 % ophthalmic solution  Commonly known as:  XALATAN  INSTILL 1 DROP IN BOTH EYES AT BEDTIME     levothyroxine 50 MCG tablet  Commonly known as:  SYNTHROID, LEVOTHROID  TAKE 1 TABLET EVERY TUESDAY, THURSDAY, SATURDAY AND SUNDAY     levothyroxine 75 MCG tablet  Commonly known as:  SYNTHROID, LEVOTHROID  Take 75 mcg by mouth See admin instructions. Takes on Monday, Wednesday, and Friday only.     loperamide 2 MG capsule  Commonly known as:  IMODIUM  Take 2 mg by mouth as needed. Reported on 05/30/2015     megestrol 625 MG/5ML suspension  Commonly known as:  MEGACE ES  TAKE 5 MLS (625 MG TOTAL) BY MOUTH DAILY.     mirtazapine 7.5 MG tablet  Commonly known as:  REMERON  Take 1 tablet (7.5 mg total) by mouth at bedtime.     multivitamin capsule  Take 1 capsule by mouth daily.     TOBRADEX ophthalmic ointment  Generic drug:   tobramycin-dexamethasone  Apply 1 application to eye daily as needed (rash).     traMADol 50 MG tablet  Commonly known as:  ULTRAM  Take 1 tablet (50 mg total) by mouth every 6 (six) hours as needed.     VITAMIN D-3 PO  Take 1,000 Units by mouth daily.            Follow-up Information    Follow up with Betsy Coder, MD.   Specialty:  Oncology   Why:  06/30/2015   Contact information:   [place] [address] [phone]       Hospital Course: Susan Reed is an 82 year old with a history of colon cancer. She was found to have a large pelvic mass on a CT in December. She was scheduled for a biopsy a few weeks ago, but was noted to have a significant coagulopathy. The coagulopathy partially corrected with vitamin K, but the PT/INR remained elevated when she was again scheduled for a biopsy.  Susan Reed was admitted to correct the coagulopathy prior to a biopsy procedure. She was continued on vitamin K at hospital admission and the following day the PT had corrected significantly and she was able to undergo the pelvic mass biopsy. She tolerated the biopsy without acute toxicity. The pathology report is pending on the day of discharge.  Susan Reed continues to have pelvic pain, incontinence,  and urinary/fecal urgency. I discussed the situation with her daughter and they are comfortable taking her home with follow-up at the [facility] 06/30/2015.  Arrangements are being made for a home hospital bed and bedside commode.  We will discuss palliative chemotherapy/radiation versus Hospice care pending the biopsy results.       Signed: Betsy Coder, MD 06/26/2015, 2:05 PM

## 2015-06-26 NOTE — Discharge Instructions (Signed)
Call for increased pain

## 2015-06-26 NOTE — Progress Notes (Signed)
Initial Nutrition Assessment  DOCUMENTATION CODES:   Severe malnutrition in context of chronic illness  INTERVENTION:   Encouraged patient to continue to consume Ensure Clear supplements, at least TID. Each provides 180 kcal and 8g of protein Encouraged pt's daughter to include protein in smoothies at home Encourage small frequent meals  NUTRITION DIAGNOSIS:   Malnutrition related to chronic illness as evidenced by severe depletion of body fat, severe depletion of muscle mass, energy intake < or equal to 75% for > or equal to 1 month.  GOAL:   Patient will meet greater than or equal to 90% of their needs  MONITOR:   PO intake, Supplement acceptance, Labs, Weight trends, I & O's  REASON FOR ASSESSMENT:   Malnutrition Screening Tool    ASSESSMENT:   82 y.o. 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 She completed 8 cycles of adjuvant Xeloda chemotherapy 07/26/2011 through 12/21/2011.   Pt in room with daughter at bedside. Pt reports she has sore mouth but denies any chewing or swallowing problems. Pt has very low appetite per daughter and has always been a light eater usually consuming only 50% of her meals. For breakfast pt ate 50% of her scrambled eggs and toast. Pt has been taking Megace at home to help improve appetite. Pt refuses to eat fibrous foods( raw fruits/vegetables) as she was told to avoid these foods from her GI doctor. Pt with history of colon Ca and biopsy yesterday revealed a pelvic mass.  Pt consumes 1 Ensure clear supplement a day. Encouraged pt to increase her consumption and drink her supplement in place of other liquids. Encouraged protein snacks and small frequent meals as pt does not consume much at one time.  Wt has remained stable.  Severe muscle and fat depletion noted.   Patient expected to discharge today.  Labs reviewed: Low K  Diet Order:  Diet regular Room  service appropriate?: Yes; Fluid consistency:: Thin  Skin:  Reviewed, no issues  Last BM:  2/1  Height:   Ht Readings from Last 1 Encounters:  06/24/15 4\' 11"  (1.499 m)    Weight:   Wt Readings from Last 1 Encounters:  06/24/15 107 lb (48.535 kg)    Ideal Body Weight:  44.7 kg  BMI:  Body mass index is 21.6 kg/(m^2).  Estimated Nutritional Needs:   Kcal:  1450-1650  Protein:  65-75g  Fluid:  1.7L/day  EDUCATION NEEDS:   Education needs addressed  Clayton Bibles, MS, RD, LDN Pager: [phone] After Hours Pager: [phone]

## 2015-06-26 NOTE — Care Management Note (Signed)
Case Management Note  Patient Details  Name: Susan Reed MRN: [id number] Date of Birth: [birth year]  Subjective/Objective:            82 yo admitted with Dehydration and pelvic mass        Action/Plan: To discharge home with daughter  Expected Discharge Date:   (unknown)               Expected Discharge Plan:  [place]  In-House Referral:     Discharge planning Services  CM Consult  Post Acute Care Choice:  Home Health Choice offered to:  Adult Children  DME Arranged:  3-N-1, Hospital bed DME Agency:  [place]:  RN, Nurse's Aide, Social Work [employer] Agency:  [place]  Status of Service:  In process, will continue to follow  Medicare Important Message Given:    Date Medicare IM Given:    Medicare IM give by:    Date Additional Medicare IM Given:    Additional Medicare Important Message give by:     If discussed at [place] of Stay Meetings, dates discussed:    Additional Comments: This CM met with pt and daughter about DC needs. Daughter expressed need for hospital bed and 3 in 1. Orders received and [facility] DME rep contacted.  Daughter also interested in [facility] to check in on patient as well. Choice was offered and [facility] was chosen. Orders, demographics, and H&P faxed and called in to [facility].  No other CM needs were communicated. Lynnell Catalan, RN 06/26/2015, 3:36 PM

## 2015-06-27 ENCOUNTER — Other Ambulatory Visit: Payer: Self-pay | Admitting: Nurse Practitioner

## 2015-06-27 ENCOUNTER — Emergency Department (HOSPITAL_COMMUNITY): Payer: Medicare Other

## 2015-06-27 ENCOUNTER — Telehealth: Payer: Self-pay | Admitting: *Deleted

## 2015-06-27 ENCOUNTER — Encounter (HOSPITAL_COMMUNITY): Payer: Self-pay

## 2015-06-27 ENCOUNTER — Encounter (HOSPITAL_COMMUNITY)
Admission: EM | Disposition: A | Discharge status: Hospice | Payer: Self-pay | Source: Home / Self Care | Attending: Surgery

## 2015-06-27 ENCOUNTER — Emergency Department (HOSPITAL_COMMUNITY): Payer: Medicare Other | Admitting: Anesthesiology

## 2015-06-27 ENCOUNTER — Inpatient Hospital Stay (HOSPITAL_COMMUNITY)
Admission: EM | Admit: 2015-06-27 | Discharge: 2015-07-07 | Disposition: A | Discharge status: Hospice | Payer: Medicare Other | Source: Home / Self Care | Attending: Surgery | Admitting: Surgery

## 2015-06-27 ENCOUNTER — Ambulatory Visit: Payer: Medicare Other | Admitting: Gastroenterology

## 2015-06-27 ENCOUNTER — Ambulatory Visit (HOSPITAL_BASED_OUTPATIENT_CLINIC_OR_DEPARTMENT_OTHER): Payer: Medicare Other | Admitting: Oncology

## 2015-06-27 VITALS — BP 133/66 | HR 101 | Temp 98.0°F | Resp 18 | Ht 59.0 in

## 2015-06-27 DIAGNOSIS — R609 Edema, unspecified: Secondary | ICD-10-CM | POA: Diagnosis not present

## 2015-06-27 DIAGNOSIS — E440 Moderate protein-calorie malnutrition: Secondary | ICD-10-CM | POA: Insufficient documentation

## 2015-06-27 DIAGNOSIS — C180 Malignant neoplasm of cecum: Secondary | ICD-10-CM | POA: Diagnosis not present

## 2015-06-27 DIAGNOSIS — I998 Other disorder of circulatory system: Secondary | ICD-10-CM

## 2015-06-27 DIAGNOSIS — J449 Chronic obstructive pulmonary disease, unspecified: Secondary | ICD-10-CM | POA: Diagnosis not present

## 2015-06-27 DIAGNOSIS — I743 Embolism and thrombosis of arteries of the lower extremities: Secondary | ICD-10-CM

## 2015-06-27 DIAGNOSIS — R1900 Intra-abdominal and pelvic swelling, mass and lump, unspecified site: Secondary | ICD-10-CM | POA: Diagnosis not present

## 2015-06-27 DIAGNOSIS — C182 Malignant neoplasm of ascending colon: Secondary | ICD-10-CM

## 2015-06-27 DIAGNOSIS — D682 Hereditary deficiency of other clotting factors: Secondary | ICD-10-CM

## 2015-06-27 DIAGNOSIS — C189 Malignant neoplasm of colon, unspecified: Secondary | ICD-10-CM

## 2015-06-27 DIAGNOSIS — F329 Major depressive disorder, single episode, unspecified: Secondary | ICD-10-CM

## 2015-06-27 HISTORY — PX: INTRAOPERATIVE ARTERIOGRAM: SHX5157

## 2015-06-27 HISTORY — PX: EMBOLECTOMY: SHX44

## 2015-06-27 HISTORY — PX: PATCH ANGIOPLASTY: SHX6230

## 2015-06-27 LAB — CBC WITH DIFFERENTIAL/PLATELET
BASOS ABS: 0 10*3/uL (ref 0.0–0.1)
BASOS PCT: 0 %
EOS PCT: 11 %
Eosinophils Absolute: 1.9 10*3/uL — ABNORMAL HIGH (ref 0.0–0.7)
HCT: 36.3 % (ref 36.0–46.0)
Hemoglobin: 11.7 g/dL — ABNORMAL LOW (ref 12.0–15.0)
LYMPHS ABS: 1.5 10*3/uL (ref 0.7–4.0)
Lymphocytes Relative: 8 %
MCH: 29.6 pg (ref 26.0–34.0)
MCHC: 32.2 g/dL (ref 30.0–36.0)
MCV: 91.9 fL (ref 78.0–100.0)
MONOS PCT: 9 %
Monocytes Absolute: 1.6 10*3/uL — ABNORMAL HIGH (ref 0.1–1.0)
NEUTROS PCT: 72 %
Neutro Abs: 13.2 10*3/uL — ABNORMAL HIGH (ref 1.7–7.7)
PLATELETS: 163 10*3/uL (ref 150–400)
RBC: 3.95 MIL/uL (ref 3.87–5.11)
RDW: 17.5 % — AB (ref 11.5–15.5)
WBC: 18.2 10*3/uL — ABNORMAL HIGH (ref 4.0–10.5)

## 2015-06-27 LAB — I-STAT CHEM 8, ED
BUN: 17 mg/dL (ref 6–20)
Calcium, Ion: 1.07 mmol/L — ABNORMAL LOW (ref 1.13–1.30)
Chloride: 100 mmol/L — ABNORMAL LOW (ref 101–111)
Creatinine, Ser: 0.5 mg/dL (ref 0.44–1.00)
Glucose, Bld: 148 mg/dL — ABNORMAL HIGH (ref 65–99)
HEMATOCRIT: 38 % (ref 36.0–46.0)
HEMOGLOBIN: 12.9 g/dL (ref 12.0–15.0)
POTASSIUM: 3.2 mmol/L — AB (ref 3.5–5.1)
SODIUM: 135 mmol/L (ref 135–145)
TCO2: 21 mmol/L (ref 0–100)

## 2015-06-27 LAB — PROTIME-INR
INR: 1.33 (ref 0.00–1.49)
PROTHROMBIN TIME: 16.6 s — AB (ref 11.6–15.2)

## 2015-06-27 LAB — APTT: aPTT: 27 seconds (ref 24–37)

## 2015-06-27 SURGERY — ANGIOPLASTY, USING PATCH GRAFT
Anesthesia: General | Site: Leg Upper | Laterality: Left

## 2015-06-27 MED ORDER — SODIUM CHLORIDE 0.9 % IV SOLN
INTRAVENOUS | Status: DC | PRN
  Administered 2015-06-27: 500 mL

## 2015-06-27 MED ORDER — HEMOSTATIC AGENTS (NO CHARGE) OPTIME
TOPICAL | Status: DC | PRN
  Administered 2015-06-27 (×2): 1 via TOPICAL

## 2015-06-27 MED ORDER — PHENYLEPHRINE HCL 10 MG/ML IJ SOLN
10.0000 mg | INTRAVENOUS | Status: DC | PRN
  Administered 2015-06-27: 35 ug/min via INTRAVENOUS

## 2015-06-27 MED ORDER — CEFAZOLIN SODIUM-DEXTROSE 2-3 GM-% IV SOLR
INTRAVENOUS | Status: DC | PRN
  Administered 2015-06-27: 2 g via INTRAVENOUS

## 2015-06-27 MED ORDER — IOHEXOL 300 MG/ML  SOLN
INTRAMUSCULAR | Status: DC | PRN
  Administered 2015-06-27: 25 mL via INTRA_ARTERIAL

## 2015-06-27 MED ORDER — ROCURONIUM BROMIDE 100 MG/10ML IV SOLN
INTRAVENOUS | Status: DC | PRN
  Administered 2015-06-27: 20 mg via INTRAVENOUS

## 2015-06-27 MED ORDER — ONDANSETRON HCL 4 MG/2ML IJ SOLN
INTRAMUSCULAR | Status: AC
  Filled 2015-06-27: qty 2

## 2015-06-27 MED ORDER — LACTATED RINGERS IV SOLN
INTRAVENOUS | Status: DC | PRN
  Administered 2015-06-27 (×2): via INTRAVENOUS

## 2015-06-27 MED ORDER — LIDOCAINE HCL (CARDIAC) 20 MG/ML IV SOLN
INTRAVENOUS | Status: AC
  Filled 2015-06-27: qty 5

## 2015-06-27 MED ORDER — IOHEXOL 350 MG/ML SOLN
100.0000 mL | Freq: Once | INTRAVENOUS | Status: AC | PRN
  Administered 2015-06-27: 100 mL via INTRAVENOUS

## 2015-06-27 MED ORDER — PROPOFOL 10 MG/ML IV BOLUS
INTRAVENOUS | Status: AC
  Filled 2015-06-27: qty 20

## 2015-06-27 MED ORDER — PHENYLEPHRINE 40 MCG/ML (10ML) SYRINGE FOR IV PUSH (FOR BLOOD PRESSURE SUPPORT)
PREFILLED_SYRINGE | INTRAVENOUS | Status: AC
  Filled 2015-06-27: qty 10

## 2015-06-27 MED ORDER — ONDANSETRON HCL 4 MG/2ML IJ SOLN
INTRAMUSCULAR | Status: DC | PRN
  Administered 2015-06-27: 4 mg via INTRAVENOUS

## 2015-06-27 MED ORDER — HEPARIN BOLUS VIA INFUSION
1000.0000 [IU] | Freq: Once | INTRAVENOUS | Status: AC
  Administered 2015-06-27: 1000 [IU] via INTRAVENOUS
  Filled 2015-06-27: qty 1000

## 2015-06-27 MED ORDER — 0.9 % SODIUM CHLORIDE (POUR BTL) OPTIME
TOPICAL | Status: DC | PRN
  Administered 2015-06-27 (×2): 1000 mL

## 2015-06-27 MED ORDER — PHENYLEPHRINE HCL 10 MG/ML IJ SOLN
INTRAMUSCULAR | Status: DC | PRN
  Administered 2015-06-27 (×2): 40 ug via INTRAVENOUS

## 2015-06-27 MED ORDER — SUGAMMADEX SODIUM 200 MG/2ML IV SOLN
INTRAVENOUS | Status: DC | PRN
  Administered 2015-06-27: 100 mg via INTRAVENOUS

## 2015-06-27 MED ORDER — EPHEDRINE SULFATE 50 MG/ML IJ SOLN
INTRAMUSCULAR | Status: AC
  Filled 2015-06-27: qty 1

## 2015-06-27 MED ORDER — PROPOFOL 10 MG/ML IV BOLUS
INTRAVENOUS | Status: DC | PRN
  Administered 2015-06-27: 50 mg via INTRAVENOUS

## 2015-06-27 MED ORDER — FENTANYL CITRATE (PF) 250 MCG/5ML IJ SOLN
INTRAMUSCULAR | Status: AC
  Filled 2015-06-27: qty 5

## 2015-06-27 MED ORDER — HEPARIN (PORCINE) IN NACL 100-0.45 UNIT/ML-% IJ SOLN
700.0000 [IU]/h | INTRAMUSCULAR | Status: DC
  Administered 2015-06-27: 700 [IU]/h via INTRAVENOUS
  Filled 2015-06-27 (×2): qty 250

## 2015-06-27 MED ORDER — SUCCINYLCHOLINE CHLORIDE 20 MG/ML IJ SOLN
INTRAMUSCULAR | Status: DC | PRN
  Administered 2015-06-27: 80 mg via INTRAVENOUS

## 2015-06-27 MED ORDER — LIDOCAINE HCL (CARDIAC) 20 MG/ML IV SOLN
INTRAVENOUS | Status: DC | PRN
  Administered 2015-06-27: 60 mg via INTRAVENOUS

## 2015-06-27 MED ORDER — HEPARIN SODIUM (PORCINE) 1000 UNIT/ML IJ SOLN
INTRAMUSCULAR | Status: DC | PRN
  Administered 2015-06-27: 3000 [IU] via INTRAVENOUS
  Administered 2015-06-27: 5000 [IU] via INTRAVENOUS

## 2015-06-27 MED ORDER — FENTANYL CITRATE (PF) 100 MCG/2ML IJ SOLN
INTRAMUSCULAR | Status: DC | PRN
  Administered 2015-06-27 (×6): 25 ug via INTRAVENOUS

## 2015-06-27 MED ORDER — ROCURONIUM BROMIDE 50 MG/5ML IV SOLN
INTRAVENOUS | Status: AC
  Filled 2015-06-27: qty 1

## 2015-06-27 MED ORDER — SUCCINYLCHOLINE CHLORIDE 20 MG/ML IJ SOLN
INTRAMUSCULAR | Status: AC
  Filled 2015-06-27: qty 1

## 2015-06-27 SURGICAL SUPPLY — 59 items
ADH SKN CLS APL DERMABOND .7 (GAUZE/BANDAGES/DRESSINGS) ×2
CANISTER SUCTION 2500CC (MISCELLANEOUS) ×4 IMPLANT
CANNULA VESSEL 3MM 2 BLNT TIP (CANNULA) ×7 IMPLANT
CATH EMB 2FR 60CM (CATHETERS) ×3 IMPLANT
CATH EMB 3FR 80CM (CATHETERS) ×3 IMPLANT
CATH EMB 4FR 80CM (CATHETERS) ×3 IMPLANT
CLIP TI MEDIUM 24 (CLIP) ×4 IMPLANT
CLIP TI WIDE RED SMALL 24 (CLIP) ×4 IMPLANT
DERMABOND ADVANCED (GAUZE/BANDAGES/DRESSINGS) ×2
DERMABOND ADVANCED .7 DNX12 (GAUZE/BANDAGES/DRESSINGS) ×1 IMPLANT
DRAIN SNY WOU (WOUND CARE) IMPLANT
DRAPE PROXIMA HALF (DRAPES) ×3 IMPLANT
DRAPE X-RAY CASS 24X20 (DRAPES) IMPLANT
ELECT REM PT RETURN 9FT ADLT (ELECTROSURGICAL) ×4
ELECTRODE REM PT RTRN 9FT ADLT (ELECTROSURGICAL) ×2 IMPLANT
EVACUATOR SILICONE 100CC (DRAIN) IMPLANT
GLOVE BIO SURGEON STRL SZ 6.5 (GLOVE) ×2 IMPLANT
GLOVE BIO SURGEON STRL SZ7 (GLOVE) ×3 IMPLANT
GLOVE BIO SURGEON STRL SZ7.5 (GLOVE) ×4 IMPLANT
GLOVE BIO SURGEONS STRL SZ 6.5 (GLOVE) ×1
GLOVE BIOGEL PI IND STRL 7.0 (GLOVE) ×1 IMPLANT
GLOVE BIOGEL PI IND STRL 7.5 (GLOVE) ×2 IMPLANT
GLOVE BIOGEL PI INDICATOR 7.0 (GLOVE) ×2
GLOVE BIOGEL PI INDICATOR 7.5 (GLOVE) ×4
GLOVE SURG SS PI 7.5 STRL IVOR (GLOVE) ×3 IMPLANT
GOWN STRL REUS W/ TWL LRG LVL3 (GOWN DISPOSABLE) ×6 IMPLANT
GOWN STRL REUS W/TWL LRG LVL3 (GOWN DISPOSABLE) ×12
HEMOSTAT SNOW SURGICEL 2X4 (HEMOSTASIS) ×6 IMPLANT
KIT BASIN OR (CUSTOM PROCEDURE TRAY) ×4 IMPLANT
KIT ROOM TURNOVER OR (KITS) ×4 IMPLANT
LIQUID BAND (GAUZE/BANDAGES/DRESSINGS) IMPLANT
NS IRRIG 1000ML POUR BTL (IV SOLUTION) ×8 IMPLANT
PACK PERIPHERAL VASCULAR (CUSTOM PROCEDURE TRAY) ×4 IMPLANT
PAD ARMBOARD 7.5X6 YLW CONV (MISCELLANEOUS) ×8 IMPLANT
PATCH VASC XENOSURE 1CMX6CM (Vascular Products) ×4 IMPLANT
PATCH VASC XENOSURE 1X6 (Vascular Products) ×1 IMPLANT
SET COLLECT BLD 21X3/4 12 (NEEDLE) IMPLANT
SPONGE SURGIFOAM ABS GEL 100 (HEMOSTASIS) IMPLANT
STAPLER VISISTAT 35W (STAPLE) IMPLANT
STOPCOCK 4 WAY LG BORE MALE ST (IV SETS) IMPLANT
SUT PROLENE 5 0 C 1 24 (SUTURE) ×7 IMPLANT
SUT PROLENE 6 0 CC (SUTURE) ×25 IMPLANT
SUT PROLENE 7 0 BV 1 (SUTURE) IMPLANT
SUT PROLENE 7 0 BV1 MDA (SUTURE) IMPLANT
SUT SILK 2 0 SH (SUTURE) ×4 IMPLANT
SUT SILK 3 0 (SUTURE)
SUT SILK 3-0 18XBRD TIE 12 (SUTURE) IMPLANT
SUT VIC AB 2-0 SH 27 (SUTURE) ×8
SUT VIC AB 2-0 SH 27XBRD (SUTURE) ×4 IMPLANT
SUT VIC AB 3-0 SH 27 (SUTURE) ×20
SUT VIC AB 3-0 SH 27X BRD (SUTURE) ×9 IMPLANT
SUT VIC AB 4-0 PS2 27 (SUTURE) ×8 IMPLANT
SYR 3ML LL SCALE MARK (SYRINGE) ×3 IMPLANT
SYR TB 1ML LUER SLIP (SYRINGE) ×3 IMPLANT
TAPE UMBILICAL COTTON 1/8X30 (MISCELLANEOUS) IMPLANT
TRAY FOLEY W/METER SILVER 16FR (SET/KITS/TRAYS/PACK) ×4 IMPLANT
TUBING EXTENTION W/L.L. (IV SETS) IMPLANT
UNDERPAD 30X30 INCONTINENT (UNDERPADS AND DIAPERS) ×4 IMPLANT
WATER STERILE IRR 1000ML POUR (IV SOLUTION) ×4 IMPLANT

## 2015-06-27 NOTE — ED Provider Notes (Addendum)
CSN: [id number]     Arrival date & time 06/27/15  1654 History   First MD Initiated Contact with Patient 06/27/15 1710     Chief Complaint  Patient presents with  . Leg Swelling     (Consider location/radiation/quality/duration/timing/severity/associated sxs/prior Treatment) HPI Comments: Patient is an 82 year old female with a history of newly discovered abdominal mass who has been hospitalized and discharged yesterday who presents with a left lower extremity swelling and cold white left foot. Daughter states that she noticed the swelling this morning in her foot appeared to turn white sometime this afternoon. Patient was receiving vitamin K treatment in the hospital because of an abnormal PT/INR which was thought to be do to malnutrition. Patient takes no anticoagulation. She has never had blood clots in the past. She denies any abdominal pain, nausea, vomiting, chest pain or shortness of breath. Patient states her left leg feels numb but denies significant pain.  The history is provided by the patient and a relative.    Past Medical History  Diagnosis Date  . Anemia   . COPD (chronic obstructive pulmonary disease) ([place])   . Hyperlipidemia   . Osteoporosis   . Hypertension   . Glaucoma   . Hypothyroidism   . Blood transfusion     2009  . Colon cancer ([place])     colon cancer   . Rectal bleeding   . Abdominal bloating   . Allergy 08/09/12    codeine and lactose  . Anxiety   . Depression   . Blood transfusion without reported diagnosis 2009    5 years ago for anemia  . Cataract    Past Surgical History  Procedure Laterality Date  . Tonsillectomy    . Right colectomy  2013  . Band hemorrhoidectomy      by Dr. Harlow Asa  . Cataract extraction w/ intraocular lens implant Right 2008  . Colon surgery     Family History  Problem Relation Age of Onset  . Heart disease Father   . Cancer Sister     pt unaware of what kind, but knows it was female reproductive area  . Cancer  Brother     lymphoma  . Colon cancer Neg Hx    Social History  Substance Use Topics  . Smoking status: Never Smoker   . Smokeless tobacco: Never Used  . Alcohol Use: No     Comment: occ   OB History    Gravida Para Term Preterm AB TAB SAB Ectopic Multiple Living   5 4 4  1  1   4      Review of Systems  All other systems reviewed and are negative.     Allergies  Codeine and Lactose intolerance (gi)  Home Medications   Prior to Admission medications   Medication Sig Start Date End Date Taking? Authorizing Provider  Cholecalciferol (VITAMIN D-3 PO) Take 1,000 Units by mouth daily.     Historical Provider, MD  cycloSPORINE (RESTASIS) 0.05 % ophthalmic emulsion Place 1 drop into both eyes at bedtime.     Historical Provider, MD  dorzolamide-timolol (COSOPT) 22.3-6.8 MG/ML ophthalmic solution Place 1 drop into both eyes at bedtime.  02/11/12   Historical Provider, MD  latanoprost (XALATAN) 0.005 % ophthalmic solution INSTILL 1 DROP IN BOTH EYES AT BEDTIME 04/27/15   Historical Provider, MD  levothyroxine (SYNTHROID, LEVOTHROID) 50 MCG tablet TAKE 1 TABLET EVERY TUESDAY, THURSDAY, SATURDAY AND SUNDAY 04/28/15   Historical Provider, MD  levothyroxine (SYNTHROID, LEVOTHROID) 75 MCG  tablet Take 75 mcg by mouth See admin instructions. Takes on Monday, Wednesday, and Friday only.    Historical Provider, MD  loperamide (IMODIUM) 2 MG capsule Take 2 mg by mouth as needed. Reported on 05/30/2015    Historical Provider, MD  megestrol (MEGACE ES) 625 MG/5ML suspension TAKE 5 MLS (625 MG TOTAL) BY MOUTH DAILY. 05/12/15   Historical Provider, MD  mirtazapine (REMERON) 7.5 MG tablet Take 1 tablet (7.5 mg total) by mouth at bedtime. 03/04/15   Ladell Pier, MD  Multiple Vitamin (MULTIVITAMIN) capsule Take 1 capsule by mouth daily.    Historical Provider, MD  TOBRADEX ophthalmic ointment Apply 1 application to eye daily as needed (rash).  10/11/12   Historical Provider, MD  traMADol (ULTRAM) 50 MG  tablet Take 1 tablet (50 mg total) by mouth every 6 (six) hours as needed. 06/20/15   Ladell Pier, MD   BP 142/88 mmHg  Pulse 78  Temp(Src) 98.2 F (36.8 C) (Oral)  Resp 20  SpO2 96% Physical Exam  Constitutional: She is oriented to person, place, and time. She appears well-developed and well-nourished. No distress.  HENT:  Head: Normocephalic and atraumatic.  Mouth/Throat: Oropharynx is clear and moist.  Eyes: Conjunctivae and EOM are normal. Pupils are equal, round, and reactive to light.  Neck: Normal range of motion. Neck supple.  Cardiovascular: Normal rate, regular rhythm and intact distal pulses.   No murmur heard. Pulmonary/Chest: Effort normal and breath sounds normal. No respiratory distress. She has no wheezes. She has no rales.  Abdominal: Soft. She exhibits no distension. There is no tenderness. There is no rebound and no guarding.  Musculoskeletal: Normal range of motion.  2+ pitting edema on the right lower extremity with pink skin and 2+ palpable DP pulse with normal sensation. Left lower extremity with 4+ pitting edema and closed, white with no capillary refill. No palpable pulses. Unable to Doppler a PT or DP pulse.  Popliteal pulse present on Doppler.  Neurological: She is alert and oriented to person, place, and time.  Skin: Skin is dry.  Psychiatric: She has a normal mood and affect. Her behavior is normal.  Nursing note and vitals reviewed.   ED Course  Procedures (including critical care time) Labs Review Labs Reviewed  CBC WITH DIFFERENTIAL/PLATELET  PROTIME-INR  APTT  HEPARIN LEVEL (UNFRACTIONATED)  CBC  I-STAT CHEM 8, ED    Imaging Review No results found. I have personally reviewed and evaluated these images and lab results as part of my medical decision-making.   EKG Interpretation None      MDM   Final diagnoses:  Embolism, arterial, leg, left [facility])   patient is an 82 year old female presenting today from oncology office with a  swollen cold left foot without significant pain. Patient was recently hospitalized and received vitamin K for abnormal PT/INR in the hopes of getting a biopsy of a new found abdominal mass. Patient denies any chest pain or shortness of breath. Labs from yesterday showed a normal creatinine. Repeat PT/INR, CBC and Chem-8 pending. Spoke with Dr. Oneida Alar who recommended patient get transferred to [facility] and then received a CTA. Patient started on heparin bolus and drip.Marland Kitchen  CRITICAL CARE Performed by: Blanchie Dessert Total critical care time: 30 minutes Critical care time was exclusive of separately billable procedures and treating other patients. Critical care was necessary to treat or prevent imminent or life-threatening deterioration. Critical care was time spent personally by me on the following activities: development of treatment plan with  patient and/or surrogate as well as nursing, discussions with consultants, evaluation of patient's response to treatment, examination of patient, obtaining history from patient or surrogate, ordering and performing treatments and interventions, ordering and review of laboratory studies, ordering and review of radiographic studies, pulse oximetry and re-evaluation of patient's condition.   Blanchie Dessert, MD 06/27/15 1759  Blanchie Dessert, MD 06/27/15 [id number]  Blanchie Dessert, MD 06/28/15 [phone]

## 2015-06-27 NOTE — Anesthesia Preprocedure Evaluation (Addendum)
Anesthesia Evaluation  Patient identified by MRN, date of birth, ID band Patient awake    Reviewed: Allergy & Precautions, H&P , NPO status , Patient's Chart, lab work & pertinent test results  Airway Mallampati: II  TM Distance: >3 FB Neck ROM: Full    Dental no notable dental hx. (+) Teeth Intact, Dental Advisory Given   Pulmonary COPD,    Pulmonary exam normal breath sounds clear to auscultation       Cardiovascular hypertension, + Peripheral Vascular Disease   Rhythm:Regular Rate:Normal     Neuro/Psych Anxiety Depression negative neurological ROS     GI/Hepatic negative GI ROS, Neg liver ROS,   Endo/Other  Hypothyroidism   Renal/GU negative Renal ROS  negative genitourinary   Musculoskeletal   Abdominal   Peds  Hematology negative hematology ROS (+)   Anesthesia Other Findings   Reproductive/Obstetrics negative OB ROS                            Anesthesia Physical Anesthesia Plan  ASA: III and emergent  Anesthesia Plan: General   Post-op Pain Management:    Induction: Intravenous, Rapid sequence and Cricoid pressure planned  Airway Management Planned: Oral ETT  Additional Equipment:   Intra-op Plan:   Post-operative Plan: Extubation in OR  Informed Consent: I have reviewed the patients History and Physical, chart, labs and discussed the procedure including the risks, benefits and alternatives for the proposed anesthesia with the patient or authorized representative who has indicated his/her understanding and acceptance.   Dental advisory given  Plan Discussed with: CRNA  Anesthesia Plan Comments:        Anesthesia Quick Evaluation

## 2015-06-27 NOTE — Telephone Encounter (Signed)
Message from pt's daughter reporting LLE is swollen through the thigh.  Returned call, she says pt reports leg "feels weird." Feels pinching in foot when walking. No erythema noted. [place] will bring pt in to be evaluated.

## 2015-06-27 NOTE — Anesthesia Procedure Notes (Signed)
Procedure Name: Intubation Date/Time: 06/27/2015 8:25 PM Performed by: Trixie Deis A Pre-anesthesia Checklist: Patient identified, Timeout performed, Emergency Drugs available, Suction available and Patient being monitored Patient Re-evaluated:Patient Re-evaluated prior to inductionOxygen Delivery Method: Circle system utilized Preoxygenation: Pre-oxygenation with 100% oxygen Intubation Type: IV induction, Rapid sequence and Cricoid Pressure applied Laryngoscope Size: Glidescope and 3 Grade View: Grade II Tube type: Oral Tube size: 7.5 mm Number of attempts: 2 (DL MAC 3 grade III view ) Airway Equipment and Method: Stylet Placement Confirmation: ETT inserted through vocal cords under direct vision,  breath sounds checked- equal and bilateral and positive ETCO2 Secured at: 21 cm Tube secured with: Tape Dental Injury: Teeth and Oropharynx as per pre-operative assessment

## 2015-06-27 NOTE — ED Notes (Signed)
Both heparin doses verified by Darcella Cheshire, [place]

## 2015-06-27 NOTE — Progress Notes (Addendum)
ANTICOAGULATION CONSULT NOTE - Initial Consult  Pharmacy Consult for IV heparin Indication: pulseless foot, rule out VTE  Allergies  Allergen Reactions  . Codeine Nausea And Vomiting  . Lactose Intolerance (Gi)     Patient Measurements:     Vital Signs: Temp: 98.2 F (36.8 C) (02/03 1701) Temp Source: Oral (02/03 1701) BP: 142/88 mmHg (02/03 1701) Pulse Rate: 78 (02/03 1701)  Labs:  Recent Labs  06/25/15 0004 06/25/15 0423 06/26/15 0353  HGB 11.2*  --   --   HCT 33.9*  --   --   PLT 297  --   --   APTT 33  --   --   LABPROT 19.4* 17.4* 15.7*  INR 1.64* 1.41 1.23  CREATININE  --   --  0.48    Estimated Creatinine Clearance: 33.2 mL/min (by C-G formula based on Cr of 0.48).   Medical History: Past Medical History  Diagnosis Date  . Anemia   . COPD (chronic obstructive pulmonary disease) ([place])   . Hyperlipidemia   . Osteoporosis   . Hypertension   . Glaucoma   . Hypothyroidism   . Blood transfusion     2009  . Colon cancer ([place])     colon cancer   . Rectal bleeding   . Abdominal bloating   . Allergy 08/09/12    codeine and lactose  . Anxiety   . Depression   . Blood transfusion without reported diagnosis 2009    5 years ago for anemia  . Cataract     Medications:  Scheduled:  Infusions:   Assessment: 82 yo female just discharged from [facility] on 2/2 to correct a coagulopathy prior to biopsy procedure for pelvic mass now presents to ER with left leg edema and pulseless foot to start IV heparin per pharmacy dosing. Baseline labs from 2/1 OK.   Goal of Therapy:  Heparin level 0.3-0.7 units/ml Monitor platelets by anticoagulation protocol: Yes   Plan:  1) IV heparin bolus of 1000 units then 2) IV heparin rate of 700 units/hr 3) Check heparin level 8 hours after start of IV heparin 4) Daily heparin level and CBC   Adrian Saran, PharmD, BCPS Pager [phone] 06/27/2015 5:44 PM

## 2015-06-27 NOTE — ED Notes (Signed)
Off unit with CT 

## 2015-06-27 NOTE — H&P (Signed)
[facility] [place] HISTORY AND PHYSICAL   Referring Physician: Dr Maryan Rued History of Present Illness:  Patient is a 82 y.o. female who presents for evaluation of left foot ischemia.  Pt began to have symptoms of numbness in her left foot about 9 am today.  She says it was fairly slow onset.  She was seen in the [place] ER and found to have no pulse.  She was transferred to [facility] ER for further eval.  Currently undergoing work up for pelvic tumor ? Recurrent CA or new.  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  She has had anorexia and weight loss for several months.  This was corrected for mass biopsy yesterday.  Other medical problems include hypertension, hyperlipid, COPD all of which have been stable.  Past Medical History  Diagnosis Date  . Anemia   . COPD (chronic obstructive pulmonary disease) ([place])   . Hyperlipidemia   . Osteoporosis   . Hypertension   . Glaucoma   . Hypothyroidism   . Blood transfusion     2009  . Colon cancer ([place])     colon cancer   . Rectal bleeding   . Abdominal bloating   . Allergy 08/09/12    codeine and lactose  . Anxiety   . Depression   . Blood transfusion without reported diagnosis 2009    5 years ago for anemia  . Cataract     Past Surgical History  Procedure Laterality Date  . Tonsillectomy    . Right colectomy  2013  . Band hemorrhoidectomy      by Dr. Harlow Asa  . Cataract extraction w/ intraocular lens implant Right 2008  . Colon surgery      Social History Social History  Substance Use Topics  . Smoking status: Never Smoker   . Smokeless tobacco: Never Used  . Alcohol Use: No     Comment: occ    Family History Family History  Problem Relation Age of Onset  . Heart disease Father   . Cancer Sister     pt unaware of what kind, but knows it was female reproductive area  . Cancer Brother     lymphoma  . Colon cancer Neg Hx     Allergies  Allergies   Allergen Reactions  . Codeine Nausea And Vomiting  . Lactose Intolerance (Gi)   . Other Other (See Comments)    Seasonal Allergies     Current Facility-Administered Medications  Medication Dose Route Frequency Provider Last Rate Last Dose  . heparin ADULT infusion 100 units/mL (25000 units/250 mL)  700 Units/hr Intravenous Continuous Adrian Saran, [facility] 7 mL/hr at 06/27/15 1757 700 Units/hr at 06/27/15 1757   Current Outpatient Prescriptions  Medication Sig Dispense Refill  . Cholecalciferol (VITAMIN D-3 PO) Take 1,000 Units by mouth daily.     . cycloSPORINE (RESTASIS) 0.05 % ophthalmic emulsion Place 1 drop into both eyes 2 (two) times daily.     . dorzolamide-timolol (COSOPT) 22.3-6.8 MG/ML ophthalmic solution Place 1 drop into both eyes at bedtime.     Marland Kitchen latanoprost (XALATAN) 0.005 % ophthalmic solution INSTILL 1 DROP IN BOTH EYES AT BEDTIME  3  . levothyroxine (SYNTHROID, LEVOTHROID) 50 MCG tablet TAKE 1 TABLET EVERY TUESDAY, THURSDAY, SATURDAY AND SUNDAY  2  . levothyroxine (SYNTHROID, LEVOTHROID) 75 MCG tablet Take 75 mcg by mouth See admin instructions. Takes on Monday, Wednesday, and Friday only.    . loperamide (  IMODIUM) 2 MG capsule Take 2 mg by mouth as needed for diarrhea or loose stools. Reported on 05/30/2015    . megestrol (MEGACE ES) 625 MG/5ML suspension TAKE 5 MLS (625 MG TOTAL) BY MOUTH DAILY.  3  . mirtazapine (REMERON) 7.5 MG tablet Take 1 tablet (7.5 mg total) by mouth at bedtime. 30 tablet 2  . Multiple Vitamin (MULTIVITAMIN) capsule Take 1 capsule by mouth daily.    . Omega-3 Fatty Acids (FISH OIL) 1000 MG CAPS Take 3,000 mg by mouth daily.    . traMADol (ULTRAM) 50 MG tablet Take 1 tablet (50 mg total) by mouth every 6 (six) hours as needed. (Patient taking differently: Take 50 mg by mouth every 6 (six) hours. ) 30 tablet 0  . VITAMIN K PO Take 1 tablet by mouth daily.    B[facility] Cancer ophthalmic ointment Apply 1 application to eye daily as needed (rash).     .  [DISCONTINUED] metoprolol tartrate (LOPRESSOR) 25 MG tablet Take 1 tablet (25 mg total) by mouth 2 (two) times daily. 60 tablet 1    ROS:   General:  + weight loss, Fever, chills  HEENT: No recent headaches, no nasal bleeding, no visual changes, no sore throat  Neurologic: No dizziness, blackouts, seizures. No recent symptoms of stroke or mini- stroke. No recent episodes of slurred speech, or temporary blindness.  Cardiac: No recent episodes of chest pain/pressure, no shortness of breath at rest.  No shortness of breath with exertion.  Denies history of atrial fibrillation or irregular heartbeat  Vascular: No history of rest pain in feet.  No history of claudication.  No history of non-healing ulcer, No history of DVT   Pulmonary: No home oxygen, no productive cough, no hemoptysis,  No asthma or wheezing  Musculoskeletal:  [ ]  Arthritis, [ ]  Low back pain,  [ ]  Joint pain  Hematologic:No history of hypercoagulable state.  No history of easy bleeding.  + history of anemia  Gastrointestinal: No hematochezia or melena,  No gastroesophageal reflux, no trouble swallowing  Urinary: [ ]  chronic Kidney disease, [ ]  on HD - [ ]  MWF or [ ]  TTHS, [ ]  Burning with urination, [ ]  Frequent urination, [ ]  Difficulty urinating;   Skin: No rashes  Psychological: + history of anxiety,  + history of depression   Physical Examination  Filed Vitals:   06/27/15 1701 06/27/15 1755  BP: 142/88 142/73  Pulse: 78 92  Temp: 98.2 F (36.8 C) 98.4 F (36.9 C)  TempSrc: Oral Oral  Resp: 20 16  SpO2: 96% 95%    There is no weight on file to calculate BMI.  General:  Alert and oriented, no acute distress HEENT: Normal Neck: No JVD Pulmonary: Clear to auscultation bilaterally Cardiac: Regular Rate and Rhythm Abdomen: Soft, non-distended, firm pelvic mass mildly tender Skin: No rash, foot cool pale motor intact Extremity Pulses:  2+ radial, brachial, femoral, right 2+dorsalis pedis, absent DP/PT  left foot no doppler signal Musculoskeletal: No deformity or edema  Neurologic: Upper and lower extremity motor 5/5 and symmetric  DATA:  CTA abd/pelvis with runoff left popliteal embolus tibials AT and PT reconstitute   ASSESSMENT:  Acute ischemia left foot   PLAN:  Emergency embolectomy left leg possible fasciotomy heparin for now  Ruta Hinds, MD [facility] [place] Office: [phone] Pager: [phone]

## 2015-06-27 NOTE — ED Notes (Signed)
Pt recently dx with abdominal mass.  Pt had biopsy Wednesday.  Pt woke this morning with swelling to upper left thigh down leg.  Numbness with redness.

## 2015-06-27 NOTE — ED Notes (Signed)
[facility] at bedside to transport pt to [facility] ED

## 2015-06-27 NOTE — ED Provider Notes (Signed)
CSN: [id number]     Arrival date & time 06/27/15  1654 History   First MD Initiated Contact with Patient 06/27/15 1710     Chief Complaint  Patient presents with  . Leg Swelling     (Consider location/radiation/quality/duration/timing/severity/associated sxs/prior Treatment) HPI Comments: 82 year old female with history of colon mass, recent hospitalization discharge presents to outside hospital for left leg swelling and cold foot. Patient has been treated with vitamin K supplements due to malnutrition recently. No history of blood clot per family or patient. Patient was sent over emergently to see vascular surgery and have CT Angio done. Patient denies other symptoms such as chest pain shortness of breath or fevers.  The history is provided by the patient, medical records and a caregiver.    Past Medical History  Diagnosis Date  . Anemia   . COPD (chronic obstructive pulmonary disease) ([place])   . Hyperlipidemia   . Osteoporosis   . Hypertension   . Glaucoma   . Hypothyroidism   . Blood transfusion     2009  . Colon cancer ([place])     colon cancer   . Rectal bleeding   . Abdominal bloating   . Allergy 08/09/12    codeine and lactose  . Anxiety   . Depression   . Blood transfusion without reported diagnosis 2009    5 years ago for anemia  . Cataract    Past Surgical History  Procedure Laterality Date  . Tonsillectomy    . Right colectomy  2013  . Band hemorrhoidectomy      by Dr. Harlow Asa  . Cataract extraction w/ intraocular lens implant Right 2008  . Colon surgery     Family History  Problem Relation Age of Onset  . Heart disease Father   . Cancer Sister     pt unaware of what kind, but knows it was female reproductive area  . Cancer Brother     lymphoma  . Colon cancer Neg Hx    Social History  Substance Use Topics  . Smoking status: Never Smoker   . Smokeless tobacco: Never Used  . Alcohol Use: No     Comment: occ   OB History    Gravida Para Term Preterm  AB TAB SAB Ectopic Multiple Living   5 4 4  1  1   4      Review of Systems  Constitutional: Negative for fever and chills.  HENT: Negative for congestion.   Eyes: Negative for visual disturbance.  Respiratory: Negative for shortness of breath.   Cardiovascular: Positive for leg swelling. Negative for chest pain.  Gastrointestinal: Negative for vomiting and abdominal pain.  Genitourinary: Negative for dysuria and flank pain.  Musculoskeletal: Negative for back pain, neck pain and neck stiffness.  Skin: Positive for pallor. Negative for rash.  Neurological: Negative for light-headedness and headaches.      Allergies  Codeine and Lactose intolerance (gi)  Home Medications   Prior to Admission medications   Medication Sig Start Date End Date Taking? Authorizing Provider  Cholecalciferol (VITAMIN D-3 PO) Take 1,000 Units by mouth daily.     Historical Provider, MD  cycloSPORINE (RESTASIS) 0.05 % ophthalmic emulsion Place 1 drop into both eyes at bedtime.     Historical Provider, MD  dorzolamide-timolol (COSOPT) 22.3-6.8 MG/ML ophthalmic solution Place 1 drop into both eyes at bedtime.  02/11/12   Historical Provider, MD  latanoprost (XALATAN) 0.005 % ophthalmic solution INSTILL 1 DROP IN BOTH EYES AT BEDTIME 04/27/15  Historical Provider, MD  levothyroxine (SYNTHROID, LEVOTHROID) 50 MCG tablet TAKE 1 TABLET EVERY TUESDAY, THURSDAY, SATURDAY AND SUNDAY 04/28/15   Historical Provider, MD  levothyroxine (SYNTHROID, LEVOTHROID) 75 MCG tablet Take 75 mcg by mouth See admin instructions. Takes on Monday, Wednesday, and Friday only.    Historical Provider, MD  loperamide (IMODIUM) 2 MG capsule Take 2 mg by mouth as needed. Reported on 05/30/2015    Historical Provider, MD  megestrol (MEGACE ES) 625 MG/5ML suspension TAKE 5 MLS (625 MG TOTAL) BY MOUTH DAILY. 05/12/15   Historical Provider, MD  mirtazapine (REMERON) 7.5 MG tablet Take 1 tablet (7.5 mg total) by mouth at bedtime. 03/04/15   Ladell Pier, MD  Multiple Vitamin (MULTIVITAMIN) capsule Take 1 capsule by mouth daily.    Historical Provider, MD  TOBRADEX ophthalmic ointment Apply 1 application to eye daily as needed (rash).  10/11/12   Historical Provider, MD  traMADol (ULTRAM) 50 MG tablet Take 1 tablet (50 mg total) by mouth every 6 (six) hours as needed. 06/20/15   Ladell Pier, MD   BP 142/73 mmHg  Pulse 92  Temp(Src) 98.4 F (36.9 C) (Oral)  Resp 16  SpO2 95% Physical Exam  Constitutional: She is oriented to person, place, and time. She appears well-developed. No distress.  HENT:  Head: Normocephalic and atraumatic.  Eyes: Conjunctivae are normal. Right eye exhibits no discharge. Left eye exhibits no discharge.  Neck: Normal range of motion. Neck supple. No tracheal deviation present.  Cardiovascular: Normal rate and regular rhythm.   Pulmonary/Chest: Effort normal and breath sounds normal.  Abdominal: Soft. She exhibits no distension. There is no tenderness. There is no guarding.  Musculoskeletal: She exhibits edema.  Neurological: She is alert and oriented to person, place, and time.  Skin: Skin is warm. No rash noted.  Patient has mild edema bilateral lower extremities worse in the left. Patient has pale and cool left lower leg below the knee. Unable to palpate posterior tibial or dorsalis pedis pulse on the left.  Psychiatric: She has a normal mood and affect.  Nursing note and vitals reviewed.   ED Course  Procedures (including critical care time) CRITICAL CARE Performed by: Mariea Clonts   Total critical care time: 30 minutes  Critical care time was exclusive of separately billable procedures and treating other patients.  Critical care was necessary to treat or prevent imminent or life-threatening deterioration.  Critical care was time spent personally by me on the following activities: development of treatment plan with patient and/or surrogate as well as nursing, discussions with  consultants, evaluation of patient's response to treatment, examination of patient, obtaining history from patient or surrogate, ordering and performing treatments and interventions, ordering and review of laboratory studies, ordering and review of radiographic studies, pulse oximetry and re-evaluation of patient's condition.  Labs Review Labs Reviewed  CBC WITH DIFFERENTIAL/PLATELET - Abnormal; Notable for the following:    WBC 18.2 (*)    Hemoglobin 11.7 (*)    RDW 17.5 (*)    Neutro Abs 13.2 (*)    Monocytes Absolute 1.6 (*)    Eosinophils Absolute 1.9 (*)    All other components within normal limits  PROTIME-INR - Abnormal; Notable for the following:    Prothrombin Time 16.6 (*)    All other components within normal limits  I-STAT CHEM 8, ED - Abnormal; Notable for the following:    Potassium 3.2 (*)    Chloride 100 (*)    Glucose, Bld 148 (*)  Calcium, Ion 1.07 (*)    All other components within normal limits  APTT  HEPARIN LEVEL (UNFRACTIONATED)  CBC    Imaging Review No results found. I have personally reviewed and evaluated these images and lab results as part of my medical decision-making.   EKG Interpretation None      MDM   Final diagnoses:  Embolism, arterial, leg, left (HCC)   Patient transferred for concern of acute thrombus. On arrival nursing paged vascular surgery who assessed the patient and the ER. CT angio ordered on arrival to ED.  Heparin continued.   The patients results and plan were reviewed and discussed.   Any x-rays performed were independently reviewed by myself.   Differential diagnosis were considered with the presenting HPI.  Medications  fentaNYL (SUBLIMAZE) injection 25-50 mcg (25 mcg Intravenous Given 06/28/15 0018)  fentaNYL (SUBLIMAZE) 100 MCG/2ML injection (not administered)  heparin ADULT infusion 100 units/mL (25000 units/250 mL) (not administered)  heparin bolus via infusion 1,000 Units (1,000 Units Intravenous Given 06/27/15  1757)  iohexol (OMNIPAQUE) 350 MG/ML injection 100 mL (100 mLs Intravenous Contrast Given 06/27/15 1847)    Filed Vitals:   06/28/15 0030 06/28/15 0033 06/28/15 0044 06/28/15 0045  BP: 92/78 127/75 148/74   Pulse: 99 98 99 101  Temp:   97.7 F (36.5 C)   TempSrc:      Resp: 15 12 18 23   SpO2: 100% 97% 99% 98%    Final diagnoses:  Embolism, arterial, leg, left (HCC)       Elnora Morrison, MD 06/28/15 [phone]

## 2015-06-27 NOTE — Progress Notes (Addendum)
Susan Reed OFFICE PROGRESS NOTE   Diagnosis:  History of colon cancer; pelvic mass.  INTERVAL HISTORY:   Susan Reed returns prior to scheduled follow-up. She was hospitalized 06/24/2015 through 06/26/2015 to correct a coagulopathy prior to a biopsy procedure. She received vitamin K with marked improvement in the prothrombin time. She underwent biopsy of the pelvic mass on 06/26/2015. Final pathology is pending.  Her daughter contacted the office earlier today to report asymmetric leg edema, left greater than right. We instructed her to come to the office for evaluation. Susan Reed noted "tingling" in the left foot earlier today. She also has some numbness. Both legs are swollen but the left leg is now more edematous. She continues to have pain related to the pelvic mass with associated bowel and bladder dysfunction. She also reports significant discomfort associated with hemorrhoids.  Objective:  Vital signs in last 24 hours:  Blood pressure 133/66, pulse 101, temperature 98 F (36.7 C), temperature source Oral, resp. rate 18, height 4\' 11"  (1.499 m), SpO2 98 %.   Frail-appearing elderly female. GI: Large external hemorrhoids. One hemorrhoid may be thrombosed. Vascular: Bilateral leg edema left greater than right. There is edema throughout the left leg extending to the sacrum. The lower left leg and foot are discolored with somewhat of a dusky appearance, cool to touch. Diminished capillary refill. Unable to palpate pedal pulse. Neuro: She is alert. Moving all extremities.  Skin: Question vesicular rash left upper buttock.    Lab Results:  Lab Results  Component Value Date   WBC 17.9* 06/25/2015   HGB 11.2* 06/25/2015   HCT 33.9* 06/25/2015   MCV 92.6 06/25/2015   PLT 297 06/25/2015   NEUTROABS 10.6* 05/21/2015    Imaging:  Ct Biopsy  06/26/2015  INDICATION: Pelvic mass.  History of colon carcinoma. EXAM: CT BIOPSY MEDICATIONS: None. ANESTHESIA/SEDATION:  Fentanyl 0.5 mcg IV; Versed 25 mg IV Moderate Sedation Time:  6 The patient was continuously monitored during the procedure by the interventional radiology nurse under my direct supervision. FLUOROSCOPY TIME:  Fluoroscopy Time:  minutes  seconds ( mGy). COMPLICATIONS: None immediate. PROCEDURE: Informed written consent was obtained from the patient after a thorough discussion of the procedural risks, benefits and alternatives. All questions were addressed. Maximal Sterile Barrier Technique was utilized including caps, mask, sterile gowns, sterile gloves, sterile drape, hand hygiene and skin antiseptic. A timeout was performed prior to the initiation of the procedure. Under CT guidance, a(n) 17 gauge guide needle was advanced into the large pelvic mass. Subsequently 3 18 gauge core biopsies were obtained. The guide needle was removed. Post biopsy images demonstrate no hemorrhage. Patient tolerated the procedure well without complication. Vital sign monitoring by nursing staff during the procedure will continue as patient is in the special procedures unit for post procedure observation. The images document guide needle placement within the large pelvic mass. Post biopsy images demonstrate no evidence of hemorrhage. IMPRESSION: Successful CT-guided pelvic mass core biopsy. Electronically Signed   By: Marybelle Killings M.D.   On: 06/26/2015 08:17    Medications: I have reviewed the patient's current medications.  Assessment/Plan: 1. Stage IIIc (T4 N2) poorly differentiated adenocarcinoma of the cecum status post right colectomy 06/24/2011. She began adjuvant Xeloda chemotherapy on 07/26/2011. She completed the eighth and final cycle beginning 12/21/2011. 2. History of anemia. Question if related to GI bleeding at presentation and then chemotherapy. 3. COPD. 4. Family history multiple cancers. 5. History of anorexia/weight loss.  6. Report of balance  difficulty and numbness in the feet when here 11/24/2011. Vtamin  B 12 level in low normal range on 12/20/2011. Repeat level today was normal on 01/18/2012. 7. History of hand-foot syndrome secondary to Xeloda. Resolved. 8. History of vertigo. 9. Depression-she was started on a trial of Remeron following office visit 02/21/2012 with significant improvement. She is currently taking Remeron 7.5 mg daily. 10. Mildly elevated CEA July and August 2013-? Etiology. The CEA returned normal in December of 2013. ? related to chronic bronchitis or other inflammation. 11. CT abdomen/pelvis 05/12/2015 revealed a large pelvic mass, apparently arising from the uterus status post CT-guided biopsy 06/26/2015. Pathology pending. 12. Elevated prothrombin time; factor VII decreased. Received vitamin K 10 mg 06/12/2015, 06/13/2015 and 06/14/2015; PT/INR improved 06/16/2015, elevated PT/INR 06/24/2015-improved with additional vitamin K   Disposition: Susan Reed has asymmetric leg edema left greater than right. We are concerned for a possible arterial or venous clot. Dr. Benay Spice spoke with the emergency room physician regarding her case. We are transporting her to the emergency room for evaluation.  She is scheduled to see [place] in a follow-up visit on 06/30/2015. We will adjust accordingly pending emergency room evaluation.  Patient seen with Dr. Benay Spice. 25 minutes were spent face-to-face at today's visit with the majority of that time involved in counseling/coordination of care.  Ned Card ANP/GNP-BC   06/27/2015  4:39 PM  This was a shared visit with Ned Card. Susan Reed was interviewed and examined. We are unable to obtain arterial or venous Doppler studies as an outpatient this evening. I discussed the case with the emergency room physician. Susan Reed full be referred to the emergency room for further evaluation of the left leg swelling and discoloration.  The pathology report from the pelvic mass biopsy is pending.  Julieanne Manson, M.D.

## 2015-06-27 NOTE — Interval H&P Note (Signed)
History and Physical Interval Note:  06/27/2015 8:03 PM  Susan Reed  has presented today for surgery, with the diagnosis of Embolus Left Popliteal   The various methods of treatment have been discussed with the patient and family. After consideration of risks, benefits and other options for treatment, the patient has consented to  Procedure(s): BYPASS GRAFT FEMORAL-POPLITEAL ARTERY (Left) as a surgical intervention .  The patient's history has been reviewed, patient examined, no change in status, stable for surgery.  I have reviewed the patient's chart and labs.  Questions were answered to the patient's satisfaction.     Annamarie Major  I agree with Dr. Artis Flock note.  The patient has an ischemic left foot with documented left popliteal artery on CTA.  She has intact motor function but decreased sensation.  Details of surgery discussed with patient and family.  Will proceed with embolectomy.  Annamarie Major

## 2015-06-28 DIAGNOSIS — E440 Moderate protein-calorie malnutrition: Secondary | ICD-10-CM | POA: Insufficient documentation

## 2015-06-28 DIAGNOSIS — I998 Other disorder of circulatory system: Secondary | ICD-10-CM | POA: Diagnosis present

## 2015-06-28 DIAGNOSIS — I743 Embolism and thrombosis of arteries of the lower extremities: Secondary | ICD-10-CM

## 2015-06-28 LAB — BASIC METABOLIC PANEL
ANION GAP: 13 (ref 5–15)
BUN: 7 mg/dL (ref 6–20)
CALCIUM: 7.5 mg/dL — AB (ref 8.9–10.3)
CO2: 22 mmol/L (ref 22–32)
CREATININE: 0.49 mg/dL (ref 0.44–1.00)
Chloride: 101 mmol/L (ref 101–111)
GFR calc Af Amer: >60 mL/min (ref >60)
Glucose, Bld: 107 mg/dL — ABNORMAL HIGH (ref 65–99)
Potassium: 3.1 mmol/L — ABNORMAL LOW (ref 3.5–5.1)
Sodium: 136 mmol/L (ref 135–145)

## 2015-06-28 LAB — HEPARIN LEVEL (UNFRACTIONATED)
Heparin Unfractionated: <0.1 IU/mL — ABNORMAL LOW (ref 0.30–0.70)
Heparin Unfractionated: <0.1 IU/mL — ABNORMAL LOW (ref 0.30–0.70)

## 2015-06-28 LAB — CBC
HCT: 37 % (ref 36.0–46.0)
Hemoglobin: 12.1 g/dL (ref 12.0–15.0)
MCH: 30.3 pg (ref 26.0–34.0)
MCHC: 32.7 g/dL (ref 30.0–36.0)
MCV: 92.7 fL (ref 78.0–100.0)
PLATELETS: 158 10*3/uL (ref 150–400)
RBC: 3.99 MIL/uL (ref 3.87–5.11)
RDW: 18 % — AB (ref 11.5–15.5)
WBC: 17.5 10*3/uL — AB (ref 4.0–10.5)

## 2015-06-28 LAB — APTT: APTT: 29 s (ref 24–37)

## 2015-06-28 LAB — MRSA PCR SCREENING: MRSA BY PCR: NEGATIVE

## 2015-06-28 MED ORDER — GUAIFENESIN-DM 100-10 MG/5ML PO SYRP: 15.0000 mL | ORAL_SOLUTION | ORAL | Status: DC | PRN

## 2015-06-28 MED ORDER — MORPHINE SULFATE (PF) 2 MG/ML IV SOLN
2.0000 mg | INTRAVENOUS | Status: DC | PRN
  Administered 2015-06-28 – 2015-06-29 (×5): 2 mg via INTRAVENOUS
  Administered 2015-06-29: 4 mg via INTRAVENOUS
  Administered 2015-06-30 – 2015-07-02 (×8): 2 mg via INTRAVENOUS
  Administered 2015-07-03: 4 mg via INTRAVENOUS
  Administered 2015-07-04 – 2015-07-06 (×9): 2 mg via INTRAVENOUS
  Administered 2015-07-06 (×3): 4 mg via INTRAVENOUS
  Administered 2015-07-06 (×2): 2 mg via INTRAVENOUS
  Administered 2015-07-07: 4 mg via INTRAVENOUS
  Administered 2015-07-07: 5 mg via INTRAVENOUS
  Filled 2015-06-28 (×3): qty 1
  Filled 2015-06-28: qty 3
  Filled 2015-06-28 (×4): qty 1
  Filled 2015-06-28: qty 2
  Filled 2015-06-28 (×8): qty 1
  Filled 2015-06-28 (×2): qty 2
  Filled 2015-06-28 (×5): qty 1
  Filled 2015-06-28: qty 2
  Filled 2015-06-28: qty 1
  Filled 2015-06-28: qty 2
  Filled 2015-06-28: qty 1
  Filled 2015-06-28: qty 2
  Filled 2015-06-28 (×2): qty 1

## 2015-06-28 MED ORDER — SODIUM CHLORIDE 0.9 % IV SOLN
INTRAVENOUS | Status: DC
  Administered 2015-06-28: 02:00:00 via INTRAVENOUS

## 2015-06-28 MED ORDER — CEFUROXIME SODIUM 1.5 G IJ SOLR
1.5000 g | Freq: Two times a day (BID) | INTRAMUSCULAR | Status: AC
  Administered 2015-06-28 (×2): 1.5 g via INTRAVENOUS
  Filled 2015-06-28 (×2): qty 1.5

## 2015-06-28 MED ORDER — TOBRAMYCIN-DEXAMETHASONE 0.3-0.1 % OP OINT: 1.0000 "application " | TOPICAL_OINTMENT | Freq: Every day | OPHTHALMIC | Status: DC | PRN

## 2015-06-28 MED ORDER — LEVOTHYROXINE SODIUM 75 MCG PO TABS: 75.0000 ug | ORAL_TABLET | ORAL | Status: DC

## 2015-06-28 MED ORDER — HEPARIN (PORCINE) IN NACL 100-0.45 UNIT/ML-% IJ SOLN
1000.0000 [IU]/h | INTRAMUSCULAR | Status: DC
  Administered 2015-06-28: 400 [IU]/h via INTRAVENOUS
  Administered 2015-06-29: 1000 [IU]/h via INTRAVENOUS
  Filled 2015-06-28 (×2): qty 250

## 2015-06-28 MED ORDER — LEVOTHYROXINE SODIUM 75 MCG PO TABS
75.0000 ug | ORAL_TABLET | ORAL | Status: DC
  Administered 2015-06-30 – 2015-07-07 (×4): 75 ug via ORAL
  Filled 2015-06-28 (×4): qty 1

## 2015-06-28 MED ORDER — ACETAMINOPHEN 650 MG RE SUPP: 325.0000 mg | RECTAL | Status: DC | PRN

## 2015-06-28 MED ORDER — POTASSIUM CHLORIDE CRYS ER 20 MEQ PO TBCR: 20.0000 meq | EXTENDED_RELEASE_TABLET | Freq: Every day | ORAL | Status: DC | PRN

## 2015-06-28 MED ORDER — MAGNESIUM SULFATE 2 GM/50ML IV SOLN
2.0000 g | Freq: Every day | INTRAVENOUS | Status: DC | PRN
  Filled 2015-06-28: qty 50

## 2015-06-28 MED ORDER — LEVOTHYROXINE SODIUM 50 MCG PO TABS
50.0000 ug | ORAL_TABLET | ORAL | Status: DC
  Administered 2015-06-28 – 2015-07-06 (×6): 50 ug via ORAL
  Filled 2015-06-28 (×6): qty 1

## 2015-06-28 MED ORDER — ALUM & MAG HYDROXIDE-SIMETH 200-200-20 MG/5ML PO SUSP: 15.0000 mL | ORAL | Status: DC | PRN

## 2015-06-28 MED ORDER — CYCLOSPORINE 0.05 % OP EMUL
1.0000 [drp] | Freq: Two times a day (BID) | OPHTHALMIC | Status: DC
  Administered 2015-06-28 – 2015-07-07 (×19): 1 [drp] via OPHTHALMIC
  Filled 2015-06-28 (×21): qty 1

## 2015-06-28 MED ORDER — LATANOPROST 0.005 % OP SOLN
1.0000 [drp] | Freq: Every day | OPHTHALMIC | Status: DC
  Administered 2015-06-28 – 2015-07-01 (×4): 1 [drp] via OPHTHALMIC
  Filled 2015-06-28 (×3): qty 2.5

## 2015-06-28 MED ORDER — ENSURE ENLIVE PO LIQD
237.0000 mL | ORAL | Status: DC
  Administered 2015-06-28 – 2015-07-06 (×6): 237 mL via ORAL

## 2015-06-28 MED ORDER — HYDRALAZINE HCL 20 MG/ML IJ SOLN
5.0000 mg | INTRAMUSCULAR | Status: DC | PRN
Start: 2015-06-28 — End: 2015-07-07

## 2015-06-28 MED ORDER — ACETAMINOPHEN 325 MG PO TABS
325.0000 mg | ORAL_TABLET | ORAL | Status: DC | PRN
  Administered 2015-06-29 – 2015-07-06 (×10): 650 mg via ORAL
  Filled 2015-06-28 (×10): qty 2

## 2015-06-28 MED ORDER — ONDANSETRON HCL 4 MG/2ML IJ SOLN: 4.0000 mg | Freq: Four times a day (QID) | INTRAMUSCULAR | Status: DC | PRN

## 2015-06-28 MED ORDER — LEVOTHYROXINE SODIUM 25 MCG PO TABS: 25.0000 ug | ORAL_TABLET | Freq: Every day | ORAL | Status: DC

## 2015-06-28 MED ORDER — LABETALOL HCL 5 MG/ML IV SOLN: 10.0000 mg | INTRAVENOUS | Status: DC | PRN

## 2015-06-28 MED ORDER — METOPROLOL TARTRATE 1 MG/ML IV SOLN: 2.0000 mg | INTRAVENOUS | Status: DC | PRN

## 2015-06-28 MED ORDER — MEGESTROL ACETATE 40 MG/ML PO SUSP
400.0000 mg | Freq: Every day | ORAL | Status: DC
  Administered 2015-06-28: 400 mg via ORAL
  Filled 2015-06-28: qty 10

## 2015-06-28 MED ORDER — PHENOL 1.4 % MT LIQD: 1.0000 | OROMUCOSAL | Status: DC | PRN

## 2015-06-28 MED ORDER — OMEGA-3-ACID ETHYL ESTERS 1 G PO CAPS
1.0000 g | ORAL_CAPSULE | Freq: Every day | ORAL | Status: DC
Start: 2015-06-28 — End: 2015-07-05
  Administered 2015-06-28 – 2015-07-04 (×7): 1 g via ORAL
  Filled 2015-06-28 (×8): qty 1

## 2015-06-28 MED ORDER — FENTANYL CITRATE (PF) 100 MCG/2ML IJ SOLN
25.0000 ug | INTRAMUSCULAR | Status: DC | PRN
  Administered 2015-06-28: 25 ug via INTRAVENOUS

## 2015-06-28 MED ORDER — PANTOPRAZOLE SODIUM 40 MG PO TBEC
40.0000 mg | DELAYED_RELEASE_TABLET | Freq: Every day | ORAL | Status: DC
  Administered 2015-06-28 – 2015-07-07 (×9): 40 mg via ORAL
  Filled 2015-06-28 (×9): qty 1

## 2015-06-28 MED ORDER — ADULT MULTIVITAMIN W/MINERALS CH
1.0000 | ORAL_TABLET | Freq: Every day | ORAL | Status: DC
  Administered 2015-06-28 – 2015-07-04 (×7): 1 via ORAL
  Filled 2015-06-28 (×8): qty 1

## 2015-06-28 MED ORDER — SODIUM CHLORIDE 0.9 % IV SOLN: 500.0000 mL | Freq: Once | INTRAVENOUS | Status: DC | PRN

## 2015-06-28 MED ORDER — DOCUSATE SODIUM 100 MG PO CAPS
100.0000 mg | ORAL_CAPSULE | Freq: Every day | ORAL | Status: DC
  Administered 2015-06-29 – 2015-07-07 (×7): 100 mg via ORAL
  Filled 2015-06-28 (×7): qty 1

## 2015-06-28 MED ORDER — FENTANYL CITRATE (PF) 100 MCG/2ML IJ SOLN
INTRAMUSCULAR | Status: AC
  Filled 2015-06-28: qty 2

## 2015-06-28 MED ORDER — MIRTAZAPINE 7.5 MG PO TABS
7.5000 mg | ORAL_TABLET | Freq: Every day | ORAL | Status: DC
  Administered 2015-06-28 – 2015-07-06 (×9): 7.5 mg via ORAL
  Filled 2015-06-28 (×12): qty 1

## 2015-06-28 MED ORDER — VITAMIN D 1000 UNITS PO TABS
1000.0000 [IU] | ORAL_TABLET | Freq: Every day | ORAL | Status: DC
  Administered 2015-06-28 – 2015-07-04 (×7): 1000 [IU] via ORAL
  Filled 2015-06-28 (×8): qty 1

## 2015-06-28 MED ORDER — DORZOLAMIDE HCL-TIMOLOL MAL 2-0.5 % OP SOLN
1.0000 [drp] | Freq: Every day | OPHTHALMIC | Status: DC
  Administered 2015-06-28 – 2015-07-01 (×5): 1 [drp] via OPHTHALMIC
  Filled 2015-06-28 (×3): qty 10

## 2015-06-28 MED ORDER — LOPERAMIDE HCL 2 MG PO CAPS: 2.0000 mg | ORAL_CAPSULE | ORAL | Status: DC | PRN

## 2015-06-28 MED ORDER — BENEPROTEIN PO POWD
1.0000 | Freq: Three times a day (TID) | ORAL | Status: DC
  Administered 2015-06-28 – 2015-07-07 (×18): 6 g via ORAL
  Filled 2015-06-28 (×3): qty 227

## 2015-06-28 NOTE — Progress Notes (Signed)
Pt with peroneal doppler only in left leg today as of about 1 hr ago Right foot pink warm with triphasic doppler Left foot toes dusky Some incisional ecchymosis  Pt most like has reoccluded to some extent on heparin Will reeval a little later this morning and discuss with family  I do not believe 2nd attempt at thrombectomy is going to be beneficial at this point with underlying disease and reocclusion while on heparin but will visit the issue with pt and family a little later this morning  Pt is currently asymptomatic  Ruta Hinds, MD [facility]: [phone] Pager: [phone]

## 2015-06-28 NOTE — Progress Notes (Signed)
Initial Nutrition Assessment  DOCUMENTATION CODES:   Non-severe (moderate) malnutrition in context of chronic illness  INTERVENTION:  Provide Ensure Enlive po once daily, each supplement provides 350 kcal and 20 grams of protein Provide Beneprotein TID with meals, each packet provides 6 grams of protein and 25 kcal Encourage PO intake   NUTRITION DIAGNOSIS:   Malnutrition related to chronic illness as evidenced by severe depletion of body fat, moderate depletions of muscle mass.   GOAL:   Patient will meet greater than or equal to 90% of their needs   MONITOR:   PO intake, Supplement acceptance, Labs, Skin, Weight trends  REASON FOR ASSESSMENT:   Malnutrition Screening Tool    ASSESSMENT:   82 y.o. female who presents for evaluation of left foot ischemia. Pt began to have symptoms of numbness in her left foot about 9 am today. She was seen in the [place] ER and found to have no pulse. She was transferred to [facility] ER for further eval. Currently undergoing work up for pelvic tumor ? Recurrent CA or new. She has had anorexia and weight loss for several months. Other medical problems include hypertension, hyperlipid, COPD all of which have been stable.  Pt states that she used to weigh 116 lbs, lost down to 99 lbs and gained back to 107 lbs. She reports having a poor appetite. She drinks Ensure Clear at home. She dislikes [employer]. States that she is lactose intelerant; hesitant but, agreeable to trying vanilla Ensure Enlive. Pt has moderate muscle wasting and severe fat wasting per nutrition-focused physical exam. She reports poor appetite and constipation. She is currently on full liquids.   Labs: low potassium, calcium  Diet Order:  Diet full liquid Room service appropriate?: Yes; Fluid consistency:: Thin  Skin:  Reviewed, no issues  Last BM:  2/2  Height:   Ht Readings from Last 1 Encounters:  06/28/15 4\' 11"  (1.499 m)    Weight:   Wt Readings from Last 1  Encounters:  06/28/15 112 lb 14 oz (51.2 kg)    Ideal Body Weight:  44.7 kg  BMI:  Body mass index is 22.79 kg/(m^2).  Estimated Nutritional Needs:   Kcal:  1400-1600  Protein:  70-80 grams  Fluid:  1.4-1.6 L/day  EDUCATION NEEDS:   No education needs identified at this time  Boyd, LDN Inpatient Clinical Dietitian Pager: [phone] After Hours Pager: [phone]

## 2015-06-28 NOTE — Consult Note (Addendum)
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 Pelvic mass. History of stage IIIc colon cancer.   Chief Complaint  Patient presents with  . Leg Swelling  : My left leg was hurting me.  HPI: Susan Reed is a very charming 82 year old [place] female. She was diagnosed with stage IIIc colon cancer about 3 years ago. She was treated with oral Xeloda. She really has not been having problems until recently. She began to have some pelvic pain. There is no bleeding. She had some urinary urgency. She had a CT scan done which showed a large uterine mass.  She was to have a biopsy. She is on have a coagulopathy with a markedly elevated INR. She was not in all that well.  A factor VII level was only 12%. She was given vitamin K which helped reverse the low factor VII level. She had a biopsy last week.  Over the next couple days, she began to have some pain in the left leg. Her daughter noted that the leg was discolored. She was taken to the cancer clinic. Dr. Benay Spice knew immediately that she had an arterial embolism. She was sent to the hospital. She had a CT angiogram of the left leg. She will had 3 emboli. She went ahead and went to surgery. These were removed. She is on anticoagulation. She is on heparin.  She may need to have amputation of the left foot.  We were asked to see her to try to help with any management issues. We do not know if this pelvic mass is malignant.  She is not complaining too much in way of pain right now. She's on well with the heparin. She's had no cough or shortness of breath. She's had no problem with bowels or bladder. She does have some hemorrhoids.  Her appetite is down a little bit.  She is on Megace. I will go ahead and stop this as this is a pro-thrombotic.  Overall, she looks quite spry. Her family is with her.   Past Medical History  Diagnosis Date  . Anemia   . COPD (chronic obstructive pulmonary disease)  ([place])   . Hyperlipidemia   . Osteoporosis   . Hypertension   . Glaucoma   . Hypothyroidism   . Blood transfusion     2009  . Colon cancer ([place])     colon cancer   . Rectal bleeding   . Abdominal bloating   . Allergy 08/09/12    codeine and lactose  . Anxiety   . Depression   . Blood transfusion without reported diagnosis 2009    5 years ago for anemia  . Cataract   :  Past Surgical History  Procedure Laterality Date  . Tonsillectomy    . Right colectomy  2013  . Band hemorrhoidectomy      by Dr. Harlow Asa  . Cataract extraction w/ intraocular lens implant Right 2008  . Colon surgery    :   Current facility-administered medications:  .  0.9 %  sodium chloride infusion, 500 mL, Intravenous, Once PRN, [name], MD .  0.9 %  sodium chloride infusion, , Intravenous, Continuous, [name], MD, Last Rate: 75 mL/hr at 06/28/15 1230 .  acetaminophen (TYLENOL) tablet 325-650 mg, 325-650 mg, Oral, Q4H PRN **OR** acetaminophen (TYLENOL) suppository 325-650 mg, 325-650 mg, Rectal, Q4H PRN, [name], MD .  alum & mag hydroxide-simeth (MAALOX/MYLANTA) 200-200-20 MG/5ML suspension 15-30 mL, 15-30 mL, Oral,  Q2H PRN, [name], MD .  cefUROXime (ZINACEF) 1.5 g in dextrose 5 % 50 mL IVPB, 1.5 g, Intravenous, Q12H, [name], MD, 1.5 g at 06/28/15 0136 .  cholecalciferol (VITAMIN D) tablet 1,000 Units, 1,000 Units, Oral, Daily, [name], MD, 1,000 Units at 06/28/15 [phone] .  cycloSPORINE (RESTASIS) 0.05 % ophthalmic emulsion 1 drop, 1 drop, Both Eyes, BID, [name], MD, 1 drop at 06/28/15 [phone] .  [START ON 06/29/2015] docusate sodium (COLACE) capsule 100 mg, 100 mg, Oral, Daily, [name], MD .  dorzolamide-timolol (COSOPT) 22.3-6.8 MG/ML ophthalmic solution 1 drop, 1 drop, Both Eyes, QHS, [name], MD, 1 drop at 06/28/15 0137 .  feeding supplement (ENSURE ENLIVE) (ENSURE ENLIVE) liquid 237 mL, 237 mL, Oral, Q24H, [name], MD .   guaiFENesin-dextromethorphan (ROBITUSSIN DM) 100-10 MG/5ML syrup 15 mL, 15 mL, Oral, Q4H PRN, [name], MD .  heparin ADULT infusion 100 units/mL (25000 units/250 mL), 400 Units/hr, Intravenous, Continuous, [name], MD, Last Rate: 4 mL/hr at 06/28/15 1000, 400 Units/hr at 06/28/15 1000 .  hydrALAZINE (APRESOLINE) injection 5 mg, 5 mg, Intravenous, Q20 Min PRN, [name], MD .  labetalol (NORMODYNE,TRANDATE) injection 10 mg, 10 mg, Intravenous, Q10 min PRN, [name], MD .  latanoprost (XALATAN) 0.005 % ophthalmic solution 1 drop, 1 drop, Both Eyes, QHS, [name], MD, 1 drop at 06/28/15 0137 .  levothyroxine (SYNTHROID, LEVOTHROID) tablet 50 mcg, 50 mcg, Oral, Once per day on Sun Tue Thu Sat, 50 mcg at 06/28/15 3976 **AND** [START ON 06/30/2015] levothyroxine (SYNTHROID, LEVOTHROID) tablet 75 mcg, 75 mcg, Oral, Once per day on Mon Wed Fri, Vance W Brabham, MD .  loperamide (IMODIUM) capsule 2 mg, 2 mg, Oral, PRN, [name], MD .  magnesium sulfate IVPB 2 g 50 mL, 2 g, Intravenous, Daily PRN, [name], MD .  metoprolol (LOPRESSOR) injection 2-5 mg, 2-5 mg, Intravenous, Q2H PRN, [name], MD .  mirtazapine (REMERON) tablet 7.5 mg, 7.5 mg, Oral, QHS, [name], MD, 7.5 mg at 06/28/15 0200 .  morphine 2 MG/ML injection 2-5 mg, 2-5 mg, Intravenous, Q1H PRN, [name], MD, 2 mg at 06/28/15 0137 .  multivitamin with minerals tablet 1 tablet, 1 tablet, Oral, Daily, [name], MD, 1 tablet at 06/28/15 [phone] .  omega-3 acid ethyl esters (LOVAZA) capsule 1 g, 1 g, Oral, Daily, [name], MD, 1 g at 06/28/15 0950 .  ondansetron (ZOFRAN) injection 4 mg, 4 mg, Intravenous, Q6H PRN, [name], MD .  pantoprazole (PROTONIX) EC tablet 40 mg, 40 mg, Oral, Daily, [name], MD, 40 mg at 06/28/15 0942 .  phenol (CHLORASEPTIC) mouth spray 1 spray, 1 spray, Mouth/Throat, PRN, [name], MD .  potassium chloride SA (K-DUR,KLOR-CON)  CR tablet 20-40 mEq, 20-40 mEq, Oral, Daily PRN, [name], MD .  protein supplement (RESOURCE BENEPROTEIN) powder 6 g, 1 scoop, Oral, TID WC, [name], MD:  . cefUROXime (ZINACEF)  IV  1.5 g Intravenous Q12H  . cholecalciferol  1,000 Units Oral Daily  . cycloSPORINE  1 drop Both Eyes BID  . [START ON 06/29/2015] docusate sodium  100 mg Oral Daily  . dorzolamide-timolol  1 drop Both Eyes QHS  . feeding supplement (ENSURE ENLIVE)  237 mL Oral Q24H  . latanoprost  1 drop Both Eyes QHS  . levothyroxine  50 mcg Oral Once per day  on Sun Tue Thu Sat   And  . [START ON 06/30/2015] levothyroxine  75 mcg Oral Once per day on Mon Wed Fri  . mirtazapine  7.5 mg Oral QHS  . multivitamin with minerals  1 tablet Oral Daily  . omega-3 acid ethyl esters  1 g Oral Daily  . pantoprazole  40 mg Oral Daily  . protein supplement  1 scoop Oral TID WC  :  Allergies  Allergen Reactions  . Codeine Nausea And Vomiting  . Lactose Intolerance (Gi)   . Other Other (See Comments)    Seasonal Allergies  :  Family History  Problem Relation Age of Onset  . Heart disease Father   . Cancer Sister     pt unaware of what kind, but knows it was female reproductive area  . Cancer Brother     lymphoma  . Colon cancer Neg Hx   :  Social History   Social History  . Marital Status: Widowed    Spouse Name: N/A  . Number of Children: 4  . Years of Education: N/A   Occupational History  . Retired     Social History Main Topics  . Smoking status: Never Smoker   . Smokeless tobacco: Never Used  . Alcohol Use: No     Comment: occ  . Drug Use: No  . Sexual Activity: Not on file   Other Topics Concern  . Not on file   Social History Narrative   Widow.  Originally from [place].  Lives in independent living.  :  Pertinent items are noted in HPI.  Exam: Patient Vitals for the past 24 hrs:  BP Temp Temp src Pulse Resp SpO2 Height Weight  06/28/15 1131 133/64 mmHg 98.1 F (36.7 C) Oral 100  17 95 % - -  06/28/15 0730 (!) 127/58 mmHg - - 93 17 97 % - -  06/28/15 0329 (!) 107/54 mmHg 98.2 F (36.8 C) Oral 87 16 98 % - -  06/28/15 0200 (!) 114/40 mmHg - - 96 14 99 % - -  06/28/15 0058 (!) 168/81 mmHg 98.3 F (36.8 C) Oral 97 - 97 % _0  (1.499 m) 112 lb 14 oz (51.2 kg)  06/28/15 0045 - - - (!) 101 (!) 23 98 % - -  06/28/15 0044 (!) 148/74 mmHg 97.7 F (36.5 C) - 99 18 99 % - -  06/28/15 0033 127/75 mmHg - - 98 12 97 % - -  06/28/15 0030 92/78 mmHg - - 99 15 100 % - -  06/28/15 0015 (!) 145/73 mmHg - - 97 20 99 % - -  06/28/15 0007 (!) 133/115 mmHg 97.2 F (36.2 C) - 97 16 100 % - -  06/27/15 1755 142/73 mmHg 98.4 F (36.9 C) Oral 92 16 95 % - -  06/27/15 1701 142/88 mmHg 98.2 F (36.8 C) Oral 78 20 96 % - -    as above    Recent Labs  06/27/15 1735 06/27/15 1746 06/28/15 0139  WBC 18.2*  --  17.5*  HGB 11.7* 12.9 12.1  HCT 36.3 38.0 37.0  PLT 163  --  158    Recent Labs  06/26/15 0353 06/27/15 1746 06/28/15 0139  NA 137 135 136  K 3.3* 3.2* 3.1*  CL 104 100* 101  CO2 23  --  22  GLUCOSE 93 148* 107*  BUN _1 CREATININE 0.48 0.50 0.49  CALCIUM 7.1*  --  7.5*  Blood smear review:  None  Pathology: None     Assessment and Plan:  Susan Reed is a very charming 82 year old [place] female. She has a past history of a stage IIIc colon cancer. This was resected. She had 5 positive lymph nodes. The tumor was MSI- high. She was treated with Xeloda.  We do not know if this pelvic mass is malignant. It might be a leiomyoma. It certainly could be a sarcoma. It also be recurrence of the colon cancer. Her last CEA was normal. As such, I would think would be unusual for this to be recurrent colon cancer.  These should think is whether she is truly hypercoagulable. On the CT angiogram, I will see any mention of a mass pressing on the iliac artery. However, this is always a possibility.  I definitely keep her on heparin for right now.  If she does  need surgery, she will revealed blood thinner afterwards.  I think that she might require long-term anticoagulation.  I probably would consider doing an echocardiogram to make sure that she has no vegetations that might be causing the embolic disease (marantic endocarditis)  I spoke her family. I think the next is to see if the mass is malignant. If it is malignant, there is the issue of possibly resecting this out.  I would just keep her on heparin for right now. She is tolerating this well. She is not bleeding.  Her appetite and nutrition will be an issue.  I spent about 45 minutes with she and her family. I try to answer their questions. Dr. Oneida Alar also came in and spoke to the family.  She and her family have a very strong faith.  Jay 55:22

## 2015-06-28 NOTE — Progress Notes (Signed)
ANTICOAGULATION CONSULT NOTE  Pharmacy Consult for heparin Indication: arterial thrombus s/p embolectomy  Allergies  Allergen Reactions  . Codeine Nausea And Vomiting  . Lactose Intolerance (Gi)   . Other Other (See Comments)    Seasonal Allergies    Patient Measurements: Height: 4\' 11"  (149.9 cm) Weight: 112 lb 14 oz (51.2 kg) IBW/kg (Calculated) : 43.2   Vital Signs: Temp: 98.2 F (36.8 C) (02/04 1940) Temp Source: Oral (02/04 1940) BP: 118/57 mmHg (02/04 1940) Pulse Rate: 102 (02/04 1940)  Labs:  Recent Labs  06/26/15 0353  06/27/15 1735 06/27/15 1746 06/28/15 0139 06/28/15 1223 06/28/15 2209  HGB  --   < > 11.7* 12.9 12.1  --   --   HCT  --   --  36.3 38.0 37.0  --   --   PLT  --   --  163  --  158  --   --   APTT  --   --  27  --   --  29  --   LABPROT 15.7*  --  16.6*  --   --   --   --   INR 1.23  --  1.33  --   --   --   --   HEPARINUNFRC  --   --   --   --   --  <0.10* <0.10*  CREATININE 0.48  --   --  0.50 0.49  --   --   < > = values in this interval not displayed.  Estimated Creatinine Clearance: 33.2 mL/min (by C-G formula based on Cr of 0.49).   Assessment: 82 yo female with LLE ischemia s/p embolectomy for heparin Goal of Therapy:  Heparin level 0.3-0.7 units/ml Monitor platelets by anticoagulation protocol: Yes   Plan:  Increase Heparin 800 units/hr  Phillis Knack, PharmD, BCPS  06/28/2015 10:54 PM

## 2015-06-28 NOTE — Transfer of Care (Signed)
Immediate Anesthesia Transfer of Care Note  Patient: Susan Reed  Procedure(s) Performed: Procedure(s): PATCH ANGIOPLASTY (Left) EMBOLECTOMY LEFT POPLITEAL ARTERY, LEFT FEMORAL ARTERY (Left) INTRA OPERATIVE ARTERIOGRAM (Left)  Patient Location: PACU  Anesthesia Type:General  Level of Consciousness: awake and patient cooperative  Airway & Oxygen Therapy: Patient Spontanous Breathing and Patient connected to face mask oxygen  Post-op Assessment: Report given to RN and Post -op Vital signs reviewed and stable  Post vital signs: Reviewed and stable  Last Vitals:  Filed Vitals:   06/27/15 1755 06/28/15 0007  BP: 142/73 133/115  Pulse: 92 97  Temp: 36.9 C 36.2 C  Resp: 16 16    Complications: No apparent anesthesia complications

## 2015-06-28 NOTE — Brief Op Note (Signed)
06/27/2015 - 06/28/2015  12:31 AM  PATIENT:  Susan Reed  82 y.o. female  PRE-OPERATIVE DIAGNOSIS:  Embolus Left Popliteal Artery  POST-OPERATIVE DIAGNOSIS:  Embolus Left Popliteal Artery  PROCEDURE:  Procedure(s): PATCH ANGIOPLASTY (Left) EMBOLECTOMY LEFT POPLITEAL ARTERY, LEFT FEMORAL ARTERY (Left) INTRA OPERATIVE ARTERIOGRAM (Left)  SURGEON:  Surgeon(s) and Role:    [name], MD - Primary  PHYSICIAN ASSISTANT:   ASSISTANTS: none   ANESTHESIA:   general  EBL:  Total I/O In: 1400 [I.V.:1400] Out: 430 [Urine:255; Blood:175]  BLOOD ADMINISTERED:none  DRAINS: none   LOCAL MEDICATIONS USED:  NONE  SPECIMEN:  No Specimen and Source of Specimen:  femoral artery  DISPOSITION OF SPECIMEN:  PATHOLOGY  COUNTS:  YES  TOURNIQUET:  * No tourniquets in log *  DICTATION: .Dragon Dictation  PLAN OF CARE: Admit to inpatient   PATIENT DISPOSITION:  PACU - hemodynamically stable.   Delay start of Pharmacological VTE agent (>24hrs) due to surgical blood loss or risk of bleeding: not applicable

## 2015-06-28 NOTE — Progress Notes (Signed)
ANTICOAGULATION CONSULT NOTE - Initial Consult  Pharmacy Consult for IV heparin Indication: pulseless foot, rule out VTE  Allergies  Allergen Reactions  . Codeine Nausea And Vomiting  . Lactose Intolerance (Gi)   . Other Other (See Comments)    Seasonal Allergies    Patient Measurements: Height: 4\' 11"  (149.9 cm) Weight: 112 lb 14 oz (51.2 kg) IBW/kg (Calculated) : 43.2   Vital Signs: Temp: 98.1 F (36.7 C) (02/04 1131) Temp Source: Oral (02/04 1131) BP: 133/64 mmHg (02/04 1131) Pulse Rate: 100 (02/04 1131)  Labs:  Recent Labs  06/26/15 0353  06/27/15 1735 06/27/15 1746 06/28/15 0139 06/28/15 1223  HGB  --   < > 11.7* 12.9 12.1  --   HCT  --   --  36.3 38.0 37.0  --   PLT  --   --  163  --  158  --   APTT  --   --  27  --   --  29  LABPROT 15.7*  --  16.6*  --   --   --   INR 1.23  --  1.33  --   --   --   HEPARINUNFRC  --   --   --   --   --  <0.10*  CREATININE 0.48  --   --  0.50 0.49  --   < > = values in this interval not displayed.  Estimated Creatinine Clearance: 33.2 mL/min (by C-G formula based on Cr of 0.49).   Medical History: Past Medical History  Diagnosis Date  . Anemia   . COPD (chronic obstructive pulmonary disease) ([place])   . Hyperlipidemia   . Osteoporosis   . Hypertension   . Glaucoma   . Hypothyroidism   . Blood transfusion     2009  . Colon cancer ([place])     colon cancer   . Rectal bleeding   . Abdominal bloating   . Allergy 08/09/12    codeine and lactose  . Anxiety   . Depression   . Blood transfusion without reported diagnosis 2009    5 years ago for anemia  . Cataract     Medications:  Scheduled:  Infusions:   Assessment: 82 yo female just discharged from [facility] on 2/2 to correct a coagulopathy prior to biopsy procedure for pelvic mass then presented to ER with left leg edema and pulseless foot to start IV heparin per pharmacy dosing. Now s/p embolectomy of left popliteal artery on 2/3. Pt has hx of factor 7 deficiency.  Vit K PO PTA (received 10 mg daily while at [facility]).  HL currently subtherapeutic at <0.10.  Hgb 12.1, Plts 158, no bleeding noted. No line issues per RN.   Will increase rate to 600 units/hr (no bolus per vascular surgery,  Awaiting heme/onc consult for further [facility] recommendation)  Goal of Therapy:  Heparin level 0.3-0.7 units/ml Monitor platelets by anticoagulation protocol: Yes   Plan:  -Increase Heparin to 600 units/hr -8 hr HL (2200) -Daily HL/CBC -Monitor for s/sx of bleeding  -F/U hemo/onc and vascular surgery anticoagulation recommendations    Bennye Alm, PharmD Pharmacy Resident [phone]  06/28/2015 1:53 PM

## 2015-06-28 NOTE — Addendum Note (Signed)
Addendum  created 06/28/15 0536 by Zorita Pang, CRNA   Modules edited: Anesthesia Events, Narrator   Narrator:  Narrator: Event Log Edited

## 2015-06-28 NOTE — Evaluation (Signed)
Physical Therapy Evaluation Patient Details Name: Susan Reed MRN: [id number] DOB: 1924/06/07 Today's Date: 06/28/2015   History of Present Illness  pt presents with L Fem-pop bypass with re-occlusion ~4hrs after surgery.  pt with hx of COPD, Anemia, HTN, [place], Colon CA, Anxiety, and Depression.    Clinical Impression  Pt very motivated and participates well with mobility.  Pt mobility very limited by L LE pain and weakness.  At this time note that MD is discussing possible amputation with pt and family, so will continue to follow along and update D/C plan accordingly.      Follow Up Recommendations Home health PT;Supervision/Assistance - 24 hour    Equipment Recommendations  Rolling walker with 5" wheels;3in1 (PT)    Recommendations for Other Services       Precautions / Restrictions Precautions Precautions: Fall Restrictions Weight Bearing Restrictions: No      Mobility  Bed Mobility Overal bed mobility: Needs Assistance Bed Mobility: Supine to Sit     Supine to sit: Mod assist;+2 for physical assistance     General bed mobility comments: pt moves slowly, but does attempt to participate in mobility.  Limited by pain.    Transfers Overall transfer level: Needs assistance Equipment used: 2 person hand held assist Transfers: Sit to/from [facility] Sit to Stand: Mod assist;+2 physical assistance Stand pivot transfers: Max assist;+2 physical assistance       General transfer comment: pt does utilize UEs to A with coming to standing and is able to weightbear on R LE and minimally on L LE.  Increased A needed to complete pivot.    Ambulation/Gait                Stairs            Wheelchair Mobility    Modified Rankin (Stroke Patients Only)       Balance Overall balance assessment: Needs assistance Sitting-balance support: Bilateral upper extremity supported;Feet supported Sitting balance-Leahy Scale: Poor     Standing  balance support: During functional activity Standing balance-Leahy Scale: Poor                               Pertinent Vitals/Pain Pain Assessment: Faces Faces Pain Scale: Hurts even more Pain Location: During mobility pt grimacing and calling out, but when asked states pain is "only a little".   Pain Descriptors / Indicators: Grimacing Pain Intervention(s): Monitored during session;Premedicated before session;Repositioned    Home Living Family/patient expects to be discharged to:: Private residence Living Arrangements: Children Available Help at Discharge: Family;Available PRN/intermittently Type of Home: House Home Access: Stairs to enter   [employer] of Steps: "a couple" Home Layout: One level Home Equipment: Environmental consultant - 2 wheels      Prior Function Level of Independence: Independent with assistive device(s)               Hand Dominance        Extremity/Trunk Assessment   Upper Extremity Assessment: Defer to OT evaluation           Lower Extremity Assessment: RLE deficits/detail;LLE deficits/detail RLE Deficits / Details: R LE generally weak and indicates limited by pain in groin.  Sensation intact. LLE Deficits / Details: Very minimal strength throughout LE.  Sensation diminished and cold to touch.    Cervical / Trunk Assessment: Normal  Communication   Communication: No difficulties  Cognition Arousal/Alertness: Awake/alert Behavior During Therapy: WFL for tasks  assessed/performed Overall Cognitive Status: Within Functional Limits for tasks assessed                      General Comments      Exercises        Assessment/Plan    PT Assessment Patient needs continued PT services  PT Diagnosis Difficulty walking;Generalized weakness;Acute pain   PT Problem List Decreased strength;Decreased range of motion;Decreased balance;Decreased activity tolerance;Decreased mobility;Decreased coordination;Decreased knowledge of  use of DME;Impaired sensation;Pain  PT Treatment Interventions DME instruction;Gait training;Stair training;Functional mobility training;Therapeutic activities;Therapeutic exercise;Balance training;Patient/family education   PT Goals (Current goals can be found in the Care Plan section) Acute Rehab PT Goals Patient Stated Goal: L LE to heal PT Goal Formulation: With patient Time For Goal Achievement: 07/12/15 Potential to Achieve Goals: Good    Frequency Min 3X/week   Barriers to discharge        Co-evaluation               End of Session Equipment Utilized During Treatment: Gait belt Activity Tolerance: Patient limited by pain Patient left: in chair;with call bell/phone within reach Nurse Communication: Mobility status         Time: [id number] PT Time Calculation (min) (ACUTE ONLY): 24 min   Charges:   PT Evaluation $PT Eval Moderate Complexity: 1 Procedure PT Treatments $Therapeutic Activity: 8-22 mins   PT G CodesCatarina Hartshorn, [place] [phone] 06/28/2015, 1:12 PM

## 2015-06-28 NOTE — Op Note (Signed)
Patient name: Susan Reed MRN: [id number] DOB: [birth year] Sex: female  06/27/2015 - 06/28/2015 Pre-operative Diagnosis: Ischemic left leg Post-operative diagnosis:  Same Surgeon:  Annamarie Major Procedure:   #1:  Left common femoral profunda femoral and superficial femoral artery exposure   #2 left iliac, superficial femoral, profundofemoral, common femoral, and popliteal embolectomy   #3 patch angioplasty, left common femoral and superficial femoral artery   #4: Intraoperative arteriogram   #5: Left below-knee popliteal, anterior tibial, and tibioperoneal trunk exposure   #6: Left popliteal, anterior tibial, posterior tibial embolectomy   #6: Arteriotomy with primary closure, left popliteal artery, and left tibioperoneal trunk    Anesthesia:  Gen. Blood Loss:  See anesthesia record Specimens:  Arterial thrombus  Findings:  Significant thrombus burden which appeared at least subacute within the iliac, superficial femoral, femoral and profunda femoral artery.  After performing thrombectomy from the groin, I shot arteriogram which showed widely patent superficial femoral and popliteal artery.  There was inadequate perfusion by angiogram to the left leg therefore I exposed the below knee popliteal artery and directed the Fogarty catheter down the anterior tibial and what I think was the posterior tibial artery, removing organized pieces of thrombus.  I was able to get good backbleeding.  There was a good Doppler signal and anterior tibial and peroneal artery.  Indications:  The patient presented to the hospital with numbness in her left foot.  She has a large pelvic mass which was compressing the iliac artery creating a near occlusion with thrombus in the common iliac artery.  There was also complete occlusion of the popliteal artery.  She was having numbness in her foot.  The decision was made to proceed to the operating room for thromboembolectomy for limb salvage.  This was discussed  extensively with her family.  Procedure:  The patient was identified in the holding area and taken to [place] [facility]  The patient was then placed supine on the table. general anesthesia was administered.  The patient was prepped and draped in the usual sterile fashion.  A time out was called and antibiotics were administered.  A longitudinal incision was made in the left groin.  There was extensive venous collaterals that were ligated.  I exposed the common femoral artery from the inguinal ligament down to the bifurcation.  I also exposed the superficial femoral and 2 branches of the profunda femoral artery.  The patient was fully heparinized.  I made a transverse arteriotomy just proximal to the femoral bifurcation.  A #4 Fogarty catheter was advanced into the iliac artery and thrombectomy was performed removing a large amount of organized thrombus.  There was a subacute and acute component.  The catheter was advanced multiple times until there was excellent inflow and no further clot was evacuated.  I then advanced a #3 Fogarty catheter in the superficial femoral artery.  I had difficulty getting it into the superficial femoral artery because of a large plaque at the bifurcation.  I therefore extended the arteriotomy longitudinally into the superficial femoral artery and from here was able to pass a #3 Fogarty catheter all the way to the foot.  Again a large amount of organized thrombus was evacuated and there was good backbleeding.  This was done until no further clot was evacuated.  I then directed the #3 Fogarty catheter into both branches of the profunda femoral and was able to get out additional clot.  At this point in time I proceeded with patch angioplasty  using a bovine pericardial patch which extended from the distal common femoral artery onto the superficial femoral artery.  The patch was approximately 2 cm in length and sewn in with 5-0 Prolene.  Once the patch repair was completed I evaluated the foot  with Doppler signals and did not get an audible Doppler signal.  I then shot a intraoperative arteriogram which revealed widely patent superficial femoral and popliteal artery.  There was just a trickle of blood flow down the tibial vessels.  At this point I felt I needed to expose the below-knee popliteal artery.  The groin was packed with dry gauze and a medial below-knee incision was made.  The fascia was divided and I entered the popliteal space.  The gastrocnemius muscle was reflected posteriorly.  I took down soleal attachments to the tibia and exposed the below knee popliteal artery.  I divided the anterior tibial vein exposing the origin of the anterior tibial and tibioperoneal trunk.  I then made a transverse arteriotomy in the popliteal artery and directed a #2 Fogarty catheter through the anterior tibial artery.  I had difficulty directing the catheter into the tibioperoneal trunk because of the angle and redundancy of the artery.  After I got clot out of the anterior tibial artery and good backbleeding I closed the popliteal arteriotomy with running 6-0 Prolene in transverse fashion.  I then made a additional arteriotomy in the tibioperoneal trunk, horizontally.  This time I could advance the catheter into the what I felt to be the posterior tibial artery based on how far the Fogarty catheter was advanced.  Additional pieces of organized thrombus were evacuated.  I did this until no further clot could be retrieved.  There was good backbleeding.  I then closed the transverse arteriotomy with interrupted 6-0 Prolene.  I then evaluated the foot and was able to get a brisk Doppler signal and anterior tibial and peroneal artery.  No posterior tibial Doppler signal could be achieved.  I did not feel additional attempts at passing a Fogarty catheter would be successful and elected to close the incision and keep the patient on heparin.  I did not reverse the heparin.  I closed the below knee incision by  reapproximating the subcutaneous tissue.  I did not close the fascia.  The skin was closed with 4-0 Vicryl and Dermabond.  In the groin once hemostasis was satisfactory, the fascia was reapproximated with 2-0 Vicryl followed by closing the subcutaneous taste tissue with multiple layers of 3-0 Vicryl and 40 on the skin.  Dermabond was applied.  The foot appeared warm.  There is a continued Doppler signal and anterior tibial and peroneal artery.   Disposition:  To PACU in stable condition.   Theotis Burrow, M.D. [facility] [place] Office: [phone] Pager:  [phone]

## 2015-06-28 NOTE — Anesthesia Postprocedure Evaluation (Signed)
Anesthesia Post Note  Patient: Susan Reed  Procedure(s) Performed: Procedure(s) (LRB): PATCH ANGIOPLASTY (Left) EMBOLECTOMY LEFT POPLITEAL ARTERY, LEFT FEMORAL ARTERY (Left) INTRA OPERATIVE ARTERIOGRAM (Left)  Patient location during evaluation: PACU Anesthesia Type: General Level of consciousness: awake and alert Pain management: pain level controlled Vital Signs Assessment: post-procedure vital signs reviewed and stable Respiratory status: spontaneous breathing, nonlabored ventilation, respiratory function stable and patient connected to nasal cannula oxygen Cardiovascular status: blood pressure returned to baseline and stable Postop Assessment: no signs of nausea or vomiting Anesthetic complications: no    Last Vitals:  Filed Vitals:   06/28/15 0058 06/28/15 0329  BP: 168/81 107/54  Pulse: 97 87  Temp: 36.8 C 36.8 C  Resp:  16    Last Pain:  Filed Vitals:   06/28/15 0329  PainSc: 1                  Shelma Eiben,W. EDMOND

## 2015-06-28 NOTE — Progress Notes (Addendum)
Pt re examined.  Left leg cool from ankle down.  Discussed findings with pt and her daughter by phone.  I do not feel that we are going to achieve any benefit from redo embolectomy in light of the patient's age comorbid conditions and findings intraop last evening.    We will keep heparin running for now.   She remains asymptomatic but most likely will need amputation of her leg.  Will continue to observe for now with plan for amputation if progressive ischemic symptoms  Will notify oncology service of pt admission as most likely she will require long term anticoagulation  Ruta Hinds, MD [facility] [place]: [phone] Pager: [phone]

## 2015-06-28 NOTE — Progress Notes (Signed)
Dr Oneida Alar notified of the inability to find Patients Dorsalis Pedis pulse, Md on way to assess. Will continue to monitor Patient.

## 2015-06-29 LAB — CBC
HCT: 28.9 % — ABNORMAL LOW (ref 36.0–46.0)
HEMOGLOBIN: 9.4 g/dL — AB (ref 12.0–15.0)
MCH: 30 pg (ref 26.0–34.0)
MCHC: 32.5 g/dL (ref 30.0–36.0)
MCV: 92.3 fL (ref 78.0–100.0)
PLATELETS: 161 10*3/uL (ref 150–400)
RBC: 3.13 MIL/uL — AB (ref 3.87–5.11)
RDW: 18.5 % — ABNORMAL HIGH (ref 11.5–15.5)
WBC: 16.5 10*3/uL — AB (ref 4.0–10.5)

## 2015-06-29 LAB — BASIC METABOLIC PANEL
ANION GAP: 7 (ref 5–15)
BUN: 11 mg/dL (ref 6–20)
CALCIUM: 6.6 mg/dL — AB (ref 8.9–10.3)
CO2: 24 mmol/L (ref 22–32)
CREATININE: 0.59 mg/dL (ref 0.44–1.00)
Chloride: 106 mmol/L (ref 101–111)
Glucose, Bld: 100 mg/dL — ABNORMAL HIGH (ref 65–99)
Potassium: 3.2 mmol/L — ABNORMAL LOW (ref 3.5–5.1)
SODIUM: 137 mmol/L (ref 135–145)

## 2015-06-29 LAB — APTT: APTT: 40 s — AB (ref 24–37)

## 2015-06-29 LAB — HEPARIN LEVEL (UNFRACTIONATED): HEPARIN UNFRACTIONATED: 0.22 [IU]/mL — AB (ref 0.30–0.70)

## 2015-06-29 MED ORDER — LACTULOSE 10 GM/15ML PO SOLN: 20.0000 g | Freq: Three times a day (TID) | ORAL | Status: DC

## 2015-06-29 MED ORDER — HYDROCORTISONE 1 % EX CREA
TOPICAL_CREAM | CUTANEOUS | Status: DC | PRN
  Filled 2015-06-29 (×2): qty 28

## 2015-06-29 MED ORDER — HEPARIN (PORCINE) IN NACL 100-0.45 UNIT/ML-% IJ SOLN
1300.0000 [IU]/h | INTRAMUSCULAR | Status: DC
  Administered 2015-06-30 (×2): 1300 [IU]/h via INTRAVENOUS
  Filled 2015-06-29 (×2): qty 250

## 2015-06-29 MED ORDER — BISACODYL 10 MG RE SUPP
10.0000 mg | Freq: Once | RECTAL | Status: AC
  Administered 2015-06-29: 10 mg via RECTAL
  Filled 2015-06-29: qty 1

## 2015-06-29 NOTE — Progress Notes (Signed)
ANTICOAGULATION CONSULT NOTE - Initial Consult  Pharmacy Consult for IV heparin Indication: arterial thrombus s/p embolectomy  Allergies  Allergen Reactions  . Codeine Nausea And Vomiting  . Lactose Intolerance (Gi)   . Other Other (See Comments)    Seasonal Allergies    Patient Measurements: Height: 4\' 11"  (149.9 cm) Weight: 112 lb 14 oz (51.2 kg) IBW/kg (Calculated) : 43.2   Vital Signs: Temp: 98 F (36.7 C) (02/05 0404) Temp Source: Oral (02/05 0404) BP: 132/52 mmHg (02/05 0404) Pulse Rate: 92 (02/05 0404)  Labs:  Recent Labs  06/27/15 1735 06/27/15 1746 06/28/15 0139 06/28/15 1223 06/28/15 2209 06/29/15 0635  HGB 11.7* 12.9 12.1  --   --  9.4*  HCT 36.3 38.0 37.0  --   --  28.9*  PLT 163  --  158  --   --  161  APTT 27  --   --  29  --  40*  LABPROT 16.6*  --   --   --   --   --   INR 1.33  --   --   --   --   --   HEPARINUNFRC  --   --   --  <0.10* <0.10* <0.10*  CREATININE  --  0.50 0.49  --   --   --     Estimated Creatinine Clearance: 33.2 mL/min (by C-G formula based on Cr of 0.49).   Medical History: Past Medical History  Diagnosis Date  . Anemia   . COPD (chronic obstructive pulmonary disease) ([place])   . Hyperlipidemia   . Osteoporosis   . Hypertension   . Glaucoma   . Hypothyroidism   . Blood transfusion     2009  . Colon cancer ([place])     colon cancer   . Rectal bleeding   . Abdominal bloating   . Allergy 08/09/12    codeine and lactose  . Anxiety   . Depression   . Blood transfusion without reported diagnosis 2009    5 years ago for anemia  . Cataract     Medications:  Scheduled:  Infusions:   Assessment: 82 yo female with LLE ischemia s/p embolectomy for heparin. Pt has hx of factor 7 deficiency. Vit K PO PTA. Per heme/onc will stay on heparin.  May require long term anticoagulation.    HL currently subtherapeutic at <0.10.  Hgb 9.4, Plts 161, no bleeding noted.   Goal of Therapy:  Heparin level 0.3-0.7 units/ml Monitor  platelets by anticoagulation protocol: Yes   Plan:  -Increase Heparin to 1000 units/hr -8 hr HL (1600) -Daily HL/CBC -Monitor for s/sx of bleeding     Bennye Alm, PharmD Pharmacy Resident [phone]  06/29/2015 7:26 AM

## 2015-06-29 NOTE — Progress Notes (Addendum)
ANTICOAGULATION CONSULT NOTE - FOLLOW UP    HL = 0.22 (goal 0.3 - 0.5 units/mL) Heparin dosing weight = 51 kg   Assessment: 89 YOF continues on IV heparin for arterial thrombus s/p embolectomy.  Heparin level sub-therapeutic but trending up.  No bleeding documented.   Plan: - Increase heparin gtt to 1100 units/hr - Check 8 hr HL    Leida Luton D. Mina Marble, PharmD, BCPS 06/29/2015, 5:29 PM

## 2015-06-29 NOTE — Progress Notes (Addendum)
[facility] [place]  Subjective  - no pain in foot   Objective 121/56 92 98.1 F (36.7 C) (Oral) 17 95%  Intake/Output Summary (Last 24 hours) at 06/29/15 1012 Last data filed at 06/29/15 0900  Gross per 24 hour  Intake 3046.46 ml  Output    850 ml  Net 2196.46 ml   Left foot dusky but warm, biphasic peroneal doppler no obvious pedal doppler Incisions healing without hematoma  Assessment/Planning: Viable left foot so far Transfer to 2w Continue heparin for now levels currently low but trending up per pharmacy Anticoagulation long term depending on biopsy results which should be back tomorrow D/c foley Ambulate Acute and chronic anemia blood loss chronic disease trend for now Leukocytosis ? Acute phase trend ECHO ordered  Ruta Hinds 06/29/2015 10:12 AM --  Laboratory Lab Results:  Recent Labs  06/28/15 0139 06/29/15 0635  WBC 17.5* 16.5*  HGB 12.1 9.4*  HCT 37.0 28.9*  PLT 158 161   BMET  Recent Labs  06/28/15 0139 06/29/15 0635  NA 136 137  K 3.1* 3.2*  CL 101 106  CO2 22 24  GLUCOSE 107* 100*  BUN 7 11  CREATININE 0.49 0.59  CALCIUM 7.5* 6.6*    COAG Lab Results  Component Value Date   INR 1.33 06/27/2015   INR 1.23 06/26/2015   INR 1.41 06/25/2015   PROTIME 40.8* 06/24/2015   PROTIME 27.6* 06/16/2015   PROTIME 34.8* 06/13/2015   No results found for: PTT

## 2015-06-29 NOTE — Progress Notes (Signed)
Susan Reed looks pretty good. She is on heparin. Her left leg is warm area and there is still some slight coolness in the left foot. I don't see any further signs of cyanosis.  Her hemoglobin has dropped a little bit. It was 12.1 yesterday. It is 9.4 today. Her platelet count looks okay. She is on heparin. Her PTT is 40. She still is not therapeutic by the heparin level.  You can feel this pelvic mass easily. The biopsy will be out tomorrow. I would not think that she is bleeding into this mass. If her hemoglobin is dropping, I would certainly do another CT of the pelvis to look at this mass.  I would have to think that given the size of this mass and the symptomatic nature of this mass, that this will have to come out. I suppose her gynecologic oncologist would make this decision.  She is eating okay.  Her renal function looks okay.  I'm thankful that her foot does not have to come off as of yet. Pharmacy is doing a great job and try to manage the heparin levels.  We will see what the hemoglobin is tomorrow.  Pete E.  1 John 1:7

## 2015-06-30 ENCOUNTER — Other Ambulatory Visit: Payer: Self-pay | Admitting: *Deleted

## 2015-06-30 ENCOUNTER — Ambulatory Visit: Payer: Medicare Other | Admitting: Nurse Practitioner

## 2015-06-30 ENCOUNTER — Other Ambulatory Visit: Payer: Medicare Other

## 2015-06-30 ENCOUNTER — Inpatient Hospital Stay (HOSPITAL_COMMUNITY): Payer: Medicare Other

## 2015-06-30 ENCOUNTER — Encounter (HOSPITAL_COMMUNITY): Payer: Self-pay | Admitting: *Deleted

## 2015-06-30 ENCOUNTER — Telehealth: Payer: Self-pay | Admitting: *Deleted

## 2015-06-30 DIAGNOSIS — Z809 Family history of malignant neoplasm, unspecified: Secondary | ICD-10-CM

## 2015-06-30 DIAGNOSIS — I998 Other disorder of circulatory system: Secondary | ICD-10-CM

## 2015-06-30 DIAGNOSIS — R1900 Intra-abdominal and pelvic swelling, mass and lump, unspecified site: Secondary | ICD-10-CM

## 2015-06-30 DIAGNOSIS — F329 Major depressive disorder, single episode, unspecified: Secondary | ICD-10-CM

## 2015-06-30 DIAGNOSIS — D682 Hereditary deficiency of other clotting factors: Secondary | ICD-10-CM

## 2015-06-30 DIAGNOSIS — J449 Chronic obstructive pulmonary disease, unspecified: Secondary | ICD-10-CM

## 2015-06-30 DIAGNOSIS — E860 Dehydration: Secondary | ICD-10-CM

## 2015-06-30 DIAGNOSIS — C180 Malignant neoplasm of cecum: Secondary | ICD-10-CM

## 2015-06-30 LAB — HEPARIN LEVEL (UNFRACTIONATED)
Heparin Unfractionated: <0.1 IU/mL — ABNORMAL LOW (ref 0.30–0.70)
Heparin Unfractionated: 0.59 IU/mL (ref 0.30–0.70)
Heparin Unfractionated: 0.42 IU/mL (ref 0.30–0.70)

## 2015-06-30 LAB — BASIC METABOLIC PANEL
Anion gap: 13 (ref 5–15)
BUN: 12 mg/dL (ref 6–20)
CALCIUM: 7 mg/dL — AB (ref 8.9–10.3)
CO2: 19 mmol/L — ABNORMAL LOW (ref 22–32)
CREATININE: 0.6 mg/dL (ref 0.44–1.00)
Chloride: 104 mmol/L (ref 101–111)
GFR calc Af Amer: >60 mL/min (ref >60)
GLUCOSE: 104 mg/dL — AB (ref 65–99)
Potassium: 3.2 mmol/L — ABNORMAL LOW (ref 3.5–5.1)
Sodium: 136 mmol/L (ref 135–145)

## 2015-06-30 LAB — CBC
HCT: 28.8 % — ABNORMAL LOW (ref 36.0–46.0)
Hemoglobin: 9.6 g/dL — ABNORMAL LOW (ref 12.0–15.0)
MCH: 30.7 pg (ref 26.0–34.0)
MCHC: 33.3 g/dL (ref 30.0–36.0)
MCV: 92 fL (ref 78.0–100.0)
PLATELETS: 167 10*3/uL (ref 150–400)
RBC: 3.13 MIL/uL — ABNORMAL LOW (ref 3.87–5.11)
RDW: 18.4 % — AB (ref 11.5–15.5)
WBC: 17.3 10*3/uL — ABNORMAL HIGH (ref 4.0–10.5)

## 2015-06-30 MED ORDER — IOHEXOL 350 MG/ML SOLN
80.0000 mL | Freq: Once | INTRAVENOUS | Status: AC | PRN
  Administered 2015-06-30: 80 mL via INTRAVENOUS

## 2015-06-30 NOTE — Progress Notes (Signed)
  [facility] Progress Note  Subjective  Pain with left foot. Unchanged from yesterday.  Objective Filed Vitals:   06/29/15 2042 06/30/15 0438  BP: 137/57 139/55  Pulse: 100 101  Temp: 99 F (37.2 C) 98.3 F (36.8 C)  Resp: 18 18    Intake/Output Summary (Last 24 hours) at 06/30/15 0746 Last data filed at 06/29/15 1608  Gross per 24 hour  Intake 656.13 ml  Output    600 ml  Net  56.13 ml    Brisk posterior tibial doppler flow.  Unable to doppler peroneal or dorsalis pedis.  Left foot warm with motor and sensory function intact.  Left leg incisions healing well.  Left leg swelling.   Assessment/Planning: 82 y.o. female is s/p: left lower extremity embolectomy 3 Days Post-Op   Left foot is warm and viable for now. Brisk PT doppler flow. Continue to monitor.  Pelvic biopsy possible leiomyoma, but cannot rule out leiomyosarcoma. Oncology following  Continue heparin. Heparin level not therapeutic.  Anemia: Hgb stable.  Continue to trend WBC.     Alvia Grove 06/30/2015 7:46 AM --  Laboratory CBC    Component Value Date/Time   WBC 17.3* 06/30/2015 0200   WBC 15.2* 05/21/2015 1440   HGB 9.6* 06/30/2015 0200   HGB 12.0 05/21/2015 1440   HCT 28.8* 06/30/2015 0200   HCT 37.4 05/21/2015 1440   PLT 167 06/30/2015 0200   PLT 378 05/21/2015 1440    BMET    Component Value Date/Time   NA 136 06/30/2015 0200   NA 134* 06/11/2015 1225   K 3.2* 06/30/2015 0200   K 3.4* 06/11/2015 1225   CL 104 06/30/2015 0200   CO2 19* 06/30/2015 0200   CO2 25 06/11/2015 1225   GLUCOSE 104* 06/30/2015 0200   GLUCOSE 107 06/11/2015 1225   BUN 12 06/30/2015 0200   BUN 9.9 06/11/2015 1225   CREATININE 0.60 06/30/2015 0200   CREATININE 0.6 06/11/2015 1225   CALCIUM 7.0* 06/30/2015 0200   CALCIUM 8.0* 06/11/2015 1225   GFRNONAA >60 06/30/2015 0200   GFRAA >60 06/30/2015 0200    COAG Lab Results  Component Value Date   INR 1.33 06/27/2015   INR 1.23  06/26/2015   INR 1.41 06/25/2015   PROTIME 40.8* 06/24/2015   PROTIME 27.6* 06/16/2015   PROTIME 34.8* 06/13/2015   No results found for: PTT  Antibiotics Anti-infectives    Start     Dose/Rate Route Frequency Ordered Stop   06/28/15 0200  cefUROXime (ZINACEF) 1.5 g in dextrose 5 % 50 mL IVPB     1.5 g 100 mL/hr over 30 Minutes Intravenous Every 12 hours 06/28/15 0113 06/28/15 1521       Virgina Jock, PA-C [facility] Office: [phone] Pager: [phone] 06/30/2015 7:46 AM    Left foot appears warm.  No significant mottling noticed today.  Below knee and inguinal incisions are healing appropriately.  There is a peroneal Doppler signal.  I discussed getting a CT scan to evaluate for compression of the left iliac artery by this pelvic mass.  Over this can be arranged today so that if intervention is needed to be done mid week.  Continue IV heparin at therapeutic levels  Annamarie Major

## 2015-06-30 NOTE — Progress Notes (Signed)
*  PRELIMINARY RESULTS* Echocardiogram 2D Echocardiogram has been performed.  Susan Reed 06/30/2015, 10:07 AM

## 2015-06-30 NOTE — Progress Notes (Signed)
ANTICOAGULATION CONSULT NOTE - Follow Up Consult  Pharmacy Consult for Heparin Indication: s/p embolectomy  Allergies  Allergen Reactions  . Codeine Nausea And Vomiting  . Lactose Intolerance (Gi)   . Other Other (See Comments)    Seasonal Allergies    Patient Measurements: Height: 4\' 11"  (149.9 cm) Weight: 112 lb 14 oz (51.2 kg) IBW/kg (Calculated) : 43.2 Heparin Dosing Weight: 51 kg  Vital Signs: Temp: 98.3 F (36.8 C) (02/06 1300) Temp Source: Oral (02/06 1300) BP: 139/61 mmHg (02/06 1300) Pulse Rate: 106 (02/06 1300)  Labs:  Recent Labs  06/28/15 0139 06/28/15 1223  06/29/15 0635  06/30/15 0200 06/30/15 1146 06/30/15 1815  HGB 12.1  --   --  9.4*  --  9.6*  --   --   HCT 37.0  --   --  28.9*  --  28.8*  --   --   PLT 158  --   --  161  --  167  --   --   APTT  --  29  --  40*  --   --   --   --   HEPARINUNFRC  --  <0.10*  < > <0.10*  < > <0.10* 0.59 0.42  CREATININE 0.49  --   --  0.59  --  0.60  --   --   < > = values in this interval not displayed.  Estimated Creatinine Clearance: 33.2 mL/min (by C-G formula based on Cr of 0.6).  Assessment:  Anticoag: Hx of factor 7 deficiency. Heparin infusion for pulseless foot. Now s/p embolectomy of left popliteal artery on 2/3. Left foot is warm and viable for now per PA note. Vit K PO PTA (received 10 mg daily while at [facility]). HL 0.42 now in goal. Hgb baseline 11s-12s now down to 9.6. - 2/6: OK to bolus if needed to get to theraputic levels (newly added admin instruction by Dr. Trula Slade)  Goal of Therapy:  Heparin level 0.3-0.7 units/ml Monitor platelets by anticoagulation protocol: Yes   Plan:  - Continue Iv heparin at 1300 units/hr.  -Daily HL/CBC -Monitor for s/sx of bleeding   Levester Fresh, PharmD, BCPS, [facility] Clinical Pharmacist Pager [phone] 06/30/2015 6:54 PM

## 2015-06-30 NOTE — Progress Notes (Signed)
IP PROGRESS NOTE  Subjective:    She complains of left foot pain.  Objective: Vital signs in last 24 hours: Blood pressure 139/61, pulse 106, temperature 98.3 F (36.8 C), temperature source Oral, resp. rate 18, height 4\' 11"  (1.499 m), weight 112 lb 14 oz (51.2 kg), SpO2 98 %.  Intake/Output from previous day: 02/05 0701 - 02/06 0700 In: 2538.9 [P.O.:240; I.V.:2298.9] Out: 600 [Urine:600]  Physical Exam:  Lungs:  Clear anteriorly Cardiac:  Regular rate and rhythm Abdomen:   Mass filling the lower abdomen/pelvis Extremities:  Surgical wounds with ecchymoses at the left groin and left lower leg, the left foot is warm, I cannot palpate a pulse   Portacath/PICC-without erythema  Lab Results:  Recent Labs  06/29/15 0635 06/30/15 0200  WBC 16.5* 17.3*  HGB 9.4* 9.6*  HCT 28.9* 28.8*  PLT 161 167    BMET  Recent Labs  06/29/15 0635 06/30/15 0200  NA 137 136  K 3.2* 3.2*  CL 106 104  CO2 24 19*  GLUCOSE 100* 104*  BUN 11 12  CREATININE 0.59 0.60  CALCIUM 6.6* 7.0*    Medications: I have reviewed the patient's current medications.  Assessment/Plan: 1. Stage IIIc (T4 N2) poorly differentiated adenocarcinoma of the cecum status post right colectomy 06/24/2011. She began adjuvant Xeloda chemotherapy on 07/26/2011. She completed the eighth and final cycle beginning 12/21/2011. 2. History of anemia. Question if related to GI bleeding at presentation and then chemotherapy. 3. COPD. 4. Family history multiple cancers. 5. History of anorexia/weight loss.  6. Report of balance difficulty and numbness in the feet when here 11/24/2011. Vtamin B 12 level in low normal range on 12/20/2011. Repeat level today was normal on 01/18/2012. 7. History of hand-foot syndrome secondary to Xeloda. Resolved. 8. History of vertigo. 9. Depression-she was started on a trial of Remeron following office visit 02/21/2012 with significant improvement. She is currently taking Remeron 7.5 mg  daily. 10. Mildly elevated CEA July and August 2013-? Etiology. The CEA returned normal in December of 2013. ? related to chronic bronchitis or other inflammation. 11. CT abdomen/pelvis 05/12/2015 revealed a large pelvic mass, apparently arising from the uterus status post CT-guided biopsy 06/26/2015.  Pathology revealed limited diagnostic material, differential diagnosis includes a leiomyoma or leiomyosarcoma. 12. Elevated prothrombin time; factor VII decreased. Received vitamin K 10 mg 06/12/2015, 06/13/2015 and 06/14/2015; PT/INR improved 06/16/2015, elevated PT/INR 06/24/2015-improved with additional vitamin K 13.  admission  06/27/2015 with an ischemic left lower leg /foot -status post an embolectomy procedure , maintained on heparin   Ms. Koen was admitted with  Left lower 70 ischemia and underwent an embolectomy procedure 06/27/2015. She continues heparin and is followed by vascular surgery.  The thrombus may be related to embolic disease or vascular compression by tumor.   I will discuss the case with GYN oncology regarding the indication for another biopsy. She may be a candidate for an embolization procedure if the large pelvic mass cannot be resected.        LOS: 3 days   Betsy Coder, MD   06/30/2015, 1:58 PM

## 2015-06-30 NOTE — Progress Notes (Signed)
Physical Therapy Treatment Patient Details Name: Susan Reed MRN: [id number] DOB: 16-Feb-1925 Today's Date: 06/30/2015    History of Present Illness pt presents with L Fem-pop bypass with re-occlusion ~4hrs after surgery.  pt with hx of COPD, Anemia, HTN, Gluacoma, Colon CA, Anxiety, and Depression.      PT Comments    Progressing steadily with mobility, more fearful of falling than worried over the pain.  Family appears to have the home set up well for her return when medically appropriate.   Follow Up Recommendations  Home health PT;Supervision/Assistance - 24 hour     Equipment Recommendations  None recommended by PT    Recommendations for Other Services       Precautions / Restrictions Precautions Precautions: Fall Restrictions Weight Bearing Restrictions: No    Mobility  Bed Mobility Overal bed mobility: Needs Assistance Bed Mobility: Supine to Sit     Supine to sit: +2 for safety/equipment;Mod assist     General bed mobility comments: bridged to EOB with light mod assist  Transfers Overall transfer level: Needs assistance Equipment used: Rolling walker (2 wheeled);1 person hand held assist Transfers: Sit to/from [facility] Sit to Stand: Mod assist;+2 safety/equipment Stand pivot transfers: Mod assist;+2 physical assistance       General transfer comment: cues for hand placement and assist to come forward and up.  Ambulation/Gait Ambulation/Gait assistance: Min assist Ambulation Distance (Feet): 55 Feet Assistive device: Rolling walker (2 wheeled) Gait Pattern/deviations: Step-through pattern Gait velocity: slower Gait velocity interpretation: Below normal speed for age/gender General Gait Details: short guarded step with pt waiting for therapist to help maneuver the RW   Stairs            Wheelchair Mobility    Modified Rankin (Stroke Patients Only)       Balance Overall balance assessment: Needs  assistance Sitting-balance support: No upper extremity supported;Feet supported Sitting balance-Leahy Scale: Fair       Standing balance-Leahy Scale: Poor Standing balance comment: reliant on assist or RW                    Cognition Arousal/Alertness: Awake/alert Behavior During Therapy: WFL for tasks assessed/performed Overall Cognitive Status: Within Functional Limits for tasks assessed                      Exercises      General Comments        Pertinent Vitals/Pain Pain Assessment: Faces Faces Pain Scale: Hurts little more Pain Location: Leg Pain Descriptors / Indicators: Discomfort Pain Intervention(s): Limited activity within patient's tolerance;Monitored during session    Home Living                      Prior Function            PT Goals (current goals can now be found in the care plan section) Acute Rehab PT Goals Patient Stated Goal: L LE to heal PT Goal Formulation: With patient Time For Goal Achievement: 07/12/15 Potential to Achieve Goals: Good Progress towards PT goals: Progressing toward goals    Frequency  Min 3X/week    PT Plan Current plan remains appropriate    Co-evaluation             End of Session Equipment Utilized During Treatment: Gait belt Activity Tolerance: Patient limited by pain Patient left: in chair;with call bell/phone within reach;with family/visitor present     Time: [id number] PT Time Calculation (min) (  ACUTE ONLY): 31 min  Charges:  $Gait Training: 8-22 mins                    G Codes:      Katieann Hungate, Tessie Fass 06/30/2015, 11:26 AM   06/30/2015  Donnella Sham, PT [phone]  (pager)

## 2015-06-30 NOTE — Care Management Important Message (Signed)
Important Message  Patient Details  Name: Altagracia Felgar MRN: [id number] Date of Birth: [birth year]   Medicare Important Message Given:  Yes    Louanne Belton 06/30/2015, 12:09 [place] Message  Patient Details  Name: Stefani Gritter MRN: [id number] Date of Birth: 09/20/1924   Medicare Important Message Given:  Yes    Damany Eastman G 06/30/2015, 12:09 PM

## 2015-06-30 NOTE — Progress Notes (Signed)
ANTICOAGULATION CONSULT NOTE - Follow Up Consult  Pharmacy Consult for Heparin Indication: s/p embolectomy  Allergies  Allergen Reactions  . Codeine Nausea And Vomiting  . Lactose Intolerance (Gi)   . Other Other (See Comments)    Seasonal Allergies    Patient Measurements: Height: 4\' 11"  (149.9 cm) Weight: 112 lb 14 oz (51.2 kg) IBW/kg (Calculated) : 43.2 Heparin Dosing Weight: 51 kg  Vital Signs: Temp: 98.3 F (36.8 C) (02/06 1300) Temp Source: Oral (02/06 1300) BP: 139/61 mmHg (02/06 1300) Pulse Rate: 106 (02/06 1300)  Labs:  Recent Labs  06/27/15 1735  06/28/15 0139 06/28/15 1223  06/29/15 0635 06/29/15 1557 06/30/15 0200 06/30/15 1146  HGB 11.7*  < > 12.1  --   --  9.4*  --  9.6*  --   HCT 36.3  < > 37.0  --   --  28.9*  --  28.8*  --   PLT 163  --  158  --   --  161  --  167  --   APTT 27  --   --  29  --  40*  --   --   --   LABPROT 16.6*  --   --   --   --   --   --   --   --   INR 1.33  --   --   --   --   --   --   --   --   HEPARINUNFRC  --   --   --  <0.10*  < > <0.10* 0.22* <0.10* 0.59  CREATININE  --   < > 0.49  --   --  0.59  --  0.60  --   < > = values in this interval not displayed.  Estimated Creatinine Clearance: 33.2 mL/min (by C-G formula based on Cr of 0.6).  Assessment:  Anticoag: Hx of factor 7 deficiency. Heparin infusion for pulseless foot. Now s/p embolectomy of left popliteal artery on 2/3. Left foot is warm and viable for now per PA note. Vit K PO PTA (received 10 mg daily [facility] at [facility]). HL 0.59 now in goal. Hgb baseline 11s-12s now down to 9.6. - 2/6: OK to bolus if needed to get to theraputic levels (newly added admin instruction by Dr. Trula Slade)   Goal of Therapy:  Heparin level 0.3-0.7 units/ml Monitor platelets by anticoagulation protocol: Yes   Plan:  - Continue Iv heparin at 1300 units/hr. Recheck level in 6 hrs. -Daily HL/CBC -Monitor for s/sx of bleeding    Magdelena Kinsella S. Alford Highland, PharmD, BCPS Clinical Staff  Pharmacist Pager [place], [place] 06/30/2015,1:06 PM

## 2015-06-30 NOTE — Evaluation (Signed)
Occupational Therapy Evaluation Patient Details Name: Susan Reed MRN: [id number] DOB: 26-Jul-1924 Today's Date: 06/30/2015    History of Present Illness pt presents with L Fem-pop bypass with re-occlusion ~4hrs after surgery.  pt with hx of COPD, Anemia, HTN, Gluacoma, Colon CA, Anxiety, and Depression.     Clinical Impression   Pt was independent with AD prior to admission.  She presents with L LE pain, anxiety about falling, generalized weakness and impaired balance.  Pt requires min-mod assist for mobility and max assist for LB ADL.  Educated daughter that 3 in 1 can also function as a tub seat.  Pt will have 24 hour care of her daughter for as long as necessary and will discharge to her daughter's home when ready.  Will follow.    Follow Up Recommendations  No OT follow up;Supervision/Assistance - 24 hour    Equipment Recommendations  None recommended by OT    Recommendations for Other Services       Precautions / Restrictions Precautions Precautions: Fall Restrictions Weight Bearing Restrictions: No      Mobility Bed Mobility Overal bed mobility: Needs Assistance Bed Mobility: Supine to Sit     Supine to sit: +2 for safety/equipment;Mod assist     General bed mobility comments: bridged to EOB with light mod assist  Transfers Overall transfer level: Needs assistance Equipment used: Rolling walker (2 wheeled);1 person hand held assist Transfers: Sit to/from [facility] Sit to Stand: Mod assist Stand pivot transfers: Mod assist       General transfer comment: cues for hand placement and assist to come forward and up.    Balance Overall balance assessment: Needs assistance Sitting-balance support: No upper extremity supported;Feet supported Sitting balance-Leahy Scale: Fair       Standing balance-Leahy Scale: Poor Standing balance comment: reliant on assist or RW                            ADL Overall ADL's : Needs  assistance/impaired Eating/Feeding: Independent;Sitting   Grooming: Set up;Sitting   Upper Body Bathing: Set up;Sitting   Lower Body Bathing: Maximal assistance;Sit to/from stand   Upper Body Dressing : Set up;Sitting   Lower Body Dressing: Maximal assistance;Sit to/from stand   Toilet Transfer: Minimal assistance;Stand-pivot;[name]   Toileting- Clothing Manipulation and Hygiene: Maximal assistance;Sit to/from stand       Functional mobility during ADLs: Minimal assistance;Rolling walker General ADL Comments: Pt unable to release walker in standing for ADL, fearful of falling.     Vision     Perception     Praxis      Pertinent Vitals/Pain Pain Assessment: Faces Faces Pain Scale: Hurts little more Pain Location: Leg Pain Descriptors / Indicators: Discomfort Pain Intervention(s): Limited activity within patient's tolerance;Monitored during session     Hand Dominance Right   Extremity/Trunk Assessment Upper Extremity Assessment Upper Extremity Assessment: Overall WFL for tasks assessed   Lower Extremity Assessment Lower Extremity Assessment: Defer to PT evaluation       Communication Communication Communication: No difficulties   Cognition Arousal/Alertness: Awake/alert Behavior During Therapy: WFL for tasks assessed/performed Overall Cognitive Status: Within Functional Limits for tasks assessed                     General Comments       Exercises       Shoulder Instructions      Home Living Family/patient expects to be discharged to:: Private residence  Living Arrangements: Children Available Help at Discharge: Family;Available 24 hours/day Type of Home: House Home Access: Stairs to enter [employer] of Steps: "a couple"   Home Layout: One level     Bathroom Shower/Tub: Teacher, early years/pre: Standard     Home Equipment: Environmental consultant - 2 wheels;Bedside commode;Hospital bed          Prior Functioning/Environment  Level of Independence: Independent with assistive device(s)             OT Diagnosis: Generalized weakness;Acute pain   OT Problem List: Decreased strength;Decreased activity tolerance;Impaired balance (sitting and/or standing);Decreased knowledge of use of DME or AE;Pain   OT Treatment/Interventions: Self-care/ADL training;DME and/or AE instruction;Therapeutic activities;Patient/family education;Balance training    OT Goals(Current goals can be found in the care plan section) Acute Rehab OT Goals Patient Stated Goal: L LE to heal OT Goal Formulation: With patient Time For Goal Achievement: 07/07/15 Potential to Achieve Goals: Good ADL Goals Pt Will Perform Grooming: with supervision;standing Pt Will Perform Lower Body Bathing: with supervision;sit to/from stand Pt Will Perform Lower Body Dressing: with supervision;sit to/from stand Pt Will Transfer to Toilet: with supervision;ambulating;bedside commode Pt Will Perform Toileting - Clothing Manipulation and hygiene: with supervision;sit to/from stand Pt Will Perform Tub/Shower Transfer: Tub transfer;with min assist;3 in 1;ambulating;rolling walker  OT Frequency: Min 2X/week   Barriers to D/C:            Co-evaluation              End of Session Equipment Utilized During Treatment: Rolling walker  Activity Tolerance: Patient limited by fatigue Patient left: in chair;with call bell/phone within reach;with family/visitor present   Time: [id number] OT Time Calculation (min): 31 min Charges:  OT General Charges $OT Visit: 1 Procedure OT Evaluation $OT Eval Moderate Complexity: 1 Procedure G-Codes:    Malka So 06/30/2015, 11:35 AM  [phone]

## 2015-06-30 NOTE — Progress Notes (Signed)
VASCULAR LAB PRELIMINARY  ARTERIAL  ABI completed: Right ABI within normal limits. Left ABI indicates mild arterial disease.    RIGHT    LEFT    PRESSURE WAVEFORM  PRESSURE WAVEFORM  BRACHIAL 144 Tri BRACHIAL 153 Tri  DP   DP    AT 151 Tri AT 94 [place]  PT 151 Tri PT 131 Tri  PER   PER    GREAT TOE  NA GREAT TOE  NA    RIGHT LEFT  ABI 0.99 0.86     Landry Mellow, RDMS, RVT  06/30/2015, 3:20 PM

## 2015-06-30 NOTE — Telephone Encounter (Signed)
Patient's daughter [name] called reporting "mom is in the hospital after emergency surgery for the three clots in her leg.  Will not make today's appointments but I'd like a call.  Biopsy results would have been given today, we need this information to know what we're to do from here.  Please call me today at [phone]."

## 2015-06-30 NOTE — Telephone Encounter (Signed)
Daughter [place] called again stating "I really really want to know biopsy results.  Perhaps they can call me at 2:45 pm when she was scheduled to come in today.  [phone]."

## 2015-06-30 NOTE — Progress Notes (Signed)
ANTICOAGULATION CONSULT NOTE  Pharmacy Consult for heparin Indication: arterial thrombus s/p embolectomy  Allergies  Allergen Reactions  . Codeine Nausea And Vomiting  . Lactose Intolerance (Gi)   . Other Other (See Comments)    Seasonal Allergies    Patient Measurements: Height: 4\' 11"  (149.9 cm) Weight: 112 lb 14 oz (51.2 kg) IBW/kg (Calculated) : 43.2   Vital Signs: Temp: 99 F (37.2 C) (02/05 2042) Temp Source: Oral (02/05 2042) BP: 137/57 mmHg (02/05 2042) Pulse Rate: 100 (02/05 2042)  Labs:  Recent Labs  06/27/15 1735  06/28/15 0139 06/28/15 1223  06/29/15 0635 06/29/15 1557 06/30/15 0200  HGB 11.7*  < > 12.1  --   --  9.4*  --  9.6*  HCT 36.3  < > 37.0  --   --  28.9*  --  28.8*  PLT 163  --  158  --   --  161  --  167  APTT 27  --   --  29  --  40*  --   --   LABPROT 16.6*  --   --   --   --   --   --   --   INR 1.33  --   --   --   --   --   --   --   HEPARINUNFRC  --   --   --  <0.10*  < > <0.10* 0.22* <0.10*  CREATININE  --   < > 0.49  --   --  0.59  --  0.60  < > = values in this interval not displayed.  Estimated Creatinine Clearance: 33.2 mL/min (by C-G formula based on Cr of 0.6).   Assessment: 82 yo female with LLE ischemia s/p embolectomy for heparin Goal of Therapy:  Heparin level 0.3-0.7 units/ml Monitor platelets by anticoagulation protocol: Yes   Plan:  Increase Heparin 1300 units/hr Check heparin level in 8 hours.   Phillis Knack, PharmD, BCPS  06/30/2015 3:51 AM

## 2015-07-01 ENCOUNTER — Encounter: Payer: Self-pay | Admitting: Oncology

## 2015-07-01 ENCOUNTER — Encounter (HOSPITAL_COMMUNITY): Payer: Self-pay | Admitting: Surgery

## 2015-07-01 LAB — CBC
HCT: 28.3 % — ABNORMAL LOW (ref 36.0–46.0)
Hemoglobin: 9.2 g/dL — ABNORMAL LOW (ref 12.0–15.0)
MCH: 29.7 pg (ref 26.0–34.0)
MCHC: 32.5 g/dL (ref 30.0–36.0)
MCV: 91.3 fL (ref 78.0–100.0)
PLATELETS: 190 10*3/uL (ref 150–400)
RBC: 3.1 MIL/uL — AB (ref 3.87–5.11)
RDW: 18.8 % — ABNORMAL HIGH (ref 11.5–15.5)
WBC: 18.6 10*3/uL — ABNORMAL HIGH (ref 4.0–10.5)

## 2015-07-01 LAB — C DIFFICILE QUICK SCREEN W PCR REFLEX
C DIFFICILE (CDIFF) INTERP: NEGATIVE
C Diff antigen: NEGATIVE
C Diff toxin: NEGATIVE

## 2015-07-01 LAB — HEPARIN LEVEL (UNFRACTIONATED): Heparin Unfractionated: 0.53 IU/mL (ref 0.30–0.70)

## 2015-07-01 MED ORDER — TRAMADOL HCL 50 MG PO TABS
50.0000 mg | ORAL_TABLET | Freq: Four times a day (QID) | ORAL | Status: DC | PRN
  Administered 2015-07-01 – 2015-07-04 (×6): 50 mg via ORAL
  Filled 2015-07-01 (×7): qty 1

## 2015-07-01 NOTE — Progress Notes (Signed)
Consult Note: Gyn-Onc  Consult was requested by Dr. Dr Susan Reed for the evaluation of Susan Reed 82 y.o. female with a uterine leiomyosarcoma.  CC:  Chief Complaint  Patient presents with  . Leg Swelling    Assessment/Plan:  [name]  is a 82 y.o.  year old with a history of colon cancer (stage IIIc) in 2013 who now has a symptomatic 15cm uterine leiomyosarcoma. While the pathology was not definitive, the clinical behavior of this mass in a postmenopausal woman is consistent with a malignant process (such as LMS) and not fibroid. Therefore I do not feel that there is a strong benefit in resampling the mass.  This patient is not a surgical candidate. I discussed this with Susan Reed and her daughter. The reasons for her lack of candidacy for surgery include: - Advanced age (52) - Poor antecedent performance status - Severe protein wasting malnutrition (Albumin 2.3) - Large size and fixed nature of the mass resulting in certain major blood loss during attempted removal.   - Pre-existing unexplained coagulopathy - Recent acute arterial thrombus requiring anticoagulation (which in and of itself makes surgical intervention a contraindication).  I discussed with the patient and her daughter that given that surgery is not an option for her, treatment intent is palliative, and not curative. Therefore, I see little role for attempting palliative chemotherapy in this patient (for all of the above stated reasons) as it is unlikely to result in palliation of symptoms (mass effect symptoms) and will only add substantial toxicity. Consideration could be made for palliative radiation, however, this is typically only employed to control bleeding symptoms and is less effective for mass effect symptoms.  Instead I believe the most appropriate course for this patient is palliative care and hospice consultation.  The patient is concerned about home hospice and being a burden on her children. I  discussed that with an active hospice service in place, much burden can be alleviated. However, this patient and her family may determine that inpatient hospice is a better option for them.    HPI: Susan Reed is a 82 year old woman who is seen in consultation the request of Dr. Benay Reed for a large pelvic mass. The patient has a history of stage IIIc (sees T4 N2) poorly differentiated adenocarcinoma of the cecum and a status post right hemicolectomy in 06/24/2011. She was then treated with Xeloda chemotherapy on 07/26/2011 completing her eighth and final cycle on 12/21/2011. At the time of her initial cancer diagnosis her CEA was elevated to 7.5. After resection and treatment of her tumor marker has remained low and stable with the most recent value on 05/21/2015 of 1.2. She had undergone imaging with MRI and pelvic ultrasound prior to her surgery in 2013 and this imaging identified a 4 cm fundal uterine mass which is felt to represent either fibroid or carcinoma. Following her chemotherapy and during her surveillance phase of her colon cancer she has not had additional imaging.  However in September 2016 she began feeling weak, anorexia, and altered GI status with intermittent constipation and diarrhea. She began feeling increasing lower abdominal girth and discomfort and pain. She began noticing a bulge in her lower abdomen and was seen and evaluated at the cancer center where she was referred to have a CT of the abdomen and pelvis which was performed at the [facility] on 05/12/2015. This revealed a large heterogeneous pelvic mass measuring 14 cm x 10 cm x 8 cm. No normal uterus or ovaries were visualized. The  mass is midline. The gonadal vessels were enlarged bilaterally. There was no ascites or mesenteric implants or adenopathy identified. It was unclear the origin of the mass however endometrial cancer or sarcoma were felt to be likely.  The patient denies vaginal bleeding or abnormal discharge. She  underwent a CT a 125 evaluation on 05/21/2015 which was normal at 22, and a CEA on 05/21/2015 was normal at 1.2. Albumin is 2.3.  Interval History: Prior to biopsy she was noted to have an unexplained coagulopathy (INR 2.9). Biopsy of the mass was performed on 06/25/15 and revealed "smooth muscle with increase cellularity, but lack of pleomorphism and mitosis. This may represent leiomyoma of the uterus, However due to limited sample leiomyosarcoma can not be completely rule out". She was admitted to the hospital on 06/28/15 with a diagnosis of left LE arterial thrombus. She underwent embolectomy and angioplasty and heparin anticoagulation.  Current Meds:  Outpatient Encounter Prescriptions as of 05/30/2015  Medication Sig  . aspirin 81 MG tablet Take 81 mg by mouth daily with breakfast.   . Calcium Carbonate-Vit D-Min (CALTRATE PLUS PO) Take 1 tablet by mouth daily.   . Cholecalciferol (VITAMIN D-3 PO) Take 1,000 Units by mouth daily.   . cycloSPORINE (RESTASIS) 0.05 % ophthalmic emulsion Place 1 drop into both eyes 2 (two) times daily.   . dorzolamide-timolol (COSOPT) 22.3-6.8 MG/ML ophthalmic solution Place 1 drop into both eyes 2 (two) times daily.   Marland Kitchen levothyroxine (SYNTHROID, LEVOTHROID) 75 MCG tablet Take 75 mcg by mouth daily before breakfast.  . mirtazapine (REMERON) 7.5 MG tablet Take 1 tablet (7.5 mg total) by mouth at bedtime.  . Multiple Vitamin (MULTIVITAMIN) capsule Take 1 capsule by mouth daily.  . potassium chloride 20 MEQ/15ML (10%) SOLN Take 15 mLs (20 mEq total) by mouth 2 (two) times daily.  [facility] ophthalmic ointment Apply to eye daily as needed.  . traMADol (ULTRAM) 50 MG tablet Take 1 tablet (50 mg total) by mouth every 6 (six) hours as needed.  . dicyclomine (BENTYL) 10 MG capsule Take 1 capsule by mouth 4 (four) times daily -  before meals and at bedtime. Reported on 05/30/2015  . fish oil-omega-3 fatty acids 1000 MG capsule Take 1 g by mouth daily. Reported on 05/30/2015  .  loperamide (IMODIUM) 2 MG capsule Take 2 mg by mouth as needed. Reported on 05/30/2015  . Vitamins A & D (VITAMIN A & D) 10000-400 UNITS CAPS Reported on 05/30/2015  . [DISCONTINUED] Probiotic Product (PROBIOTIC DAILY PO) Take 1 capsule by mouth daily.   No facility-administered encounter medications on file as of 05/30/2015.    Allergy:  Allergies  Allergen Reactions  . Codeine Nausea And Vomiting  . Lactose Intolerance (Gi)   . Other Other (See Comments)    Seasonal Allergies    Social Hx:   Social History   Social History  . Marital Status: Widowed    Spouse Name: N/A  . Number of Children: 4  . Years of Education: N/A   Occupational History  . Retired     Social History Main Topics  . Smoking status: Never Smoker   . Smokeless tobacco: Never Used  . Alcohol Use: No     Comment: occ  . Drug Use: No  . Sexual Activity: Not on file   Other Topics Concern  . Not on file   Social History Narrative   Widow.  Originally from [place].  Lives in independent living.    Past Surgical Hx:  Past Surgical History  Procedure Laterality Date  . Tonsillectomy    . Right colectomy  2013  . Band hemorrhoidectomy      by Dr. Harlow Asa  . Cataract extraction w/ intraocular lens implant Right 2008  . Colon surgery    . Patch angioplasty Left 06/27/2015    Procedure: PATCH ANGIOPLASTY;  Surgeon: [name], MD;  Location: [facility];  Service: Vascular;  Laterality: Left;  . Embolectomy Left 06/27/2015    Procedure: EMBOLECTOMY LEFT POPLITEAL ARTERY, LEFT FEMORAL ARTERY;  Surgeon: [name], MD;  Location: [place];  Service: Vascular;  Laterality: Left;  . Intraoperative arteriogram Left 06/27/2015    Procedure: INTRA OPERATIVE ARTERIOGRAM;  Surgeon: [name], MD;  Location: [facility] OR;  Service: Vascular;  Laterality: Left;    Past Medical Hx:  Past Medical History  Diagnosis Date  . Anemia   . COPD (chronic obstructive pulmonary disease) ([place])   . Hyperlipidemia   .  Osteoporosis   . Hypertension   . Glaucoma   . Hypothyroidism   . Blood transfusion     2009  . Colon cancer ([place])     colon cancer   . Rectal bleeding   . Abdominal bloating   . Allergy 08/09/12    codeine and lactose  . Anxiety   . Depression   . Blood transfusion without reported diagnosis 2009    5 years ago for anemia  . Cataract     Past Gynecological History:   No LMP recorded. Patient is postmenopausal.  Family Hx:  Family History  Problem Relation Age of Onset  . Heart disease Father   . Cancer Sister     pt unaware of what kind, but knows it was female reproductive area  . Cancer Brother     lymphoma  . Colon cancer Neg Hx     Review of Systems:  Constitutional  Feels fatigued  ENT Normal appearing ears and nares bilaterally Skin/Breast  No rash, sores, jaundice, itching, dryness Cardiovascular  No chest pain, shortness of breath, or edema  Pulmonary  No cough or wheeze.  Gastro Intestinal  + change in bowel habit to intermittent constipation and diarreha. Abdominal pain (low central). Genito Urinary  No frequency, urgency, dysuria, see HPI Musculo Skeletal  No myalgia, arthralgia, joint swelling or pain  Neurologic  No weakness, numbness, change in gait,  Psychology  No depression, anxiety, insomnia.   Vitals:  Blood pressure 136/54, pulse 98, temperature 97.8 F (36.6 C), temperature source Oral, resp. rate 18, height 4\' 11"  (1.499 m), weight 112 lb 14 oz (51.2 kg), SpO2 96 %.  Physical Exam: Temporal wasting. Psychiatry  Alert and oriented to person, place, and time  Abdomen  Normoactive bowel sounds, abdomen soft, non-tender and thin without evidence of hernia. Large mass filling central low abdomen. Minimally mobile. Tender to palpate Back No CVA tenderness Genito Urinary (deferred today, below represents findings from prior exam) Vulva/vagina: Normal external female genitalia.  No lesions. No discharge or bleeding.  Bladder/urethra:   No lesions or masses, well supported bladder  Vagina: grossly normal  Cervix: deviated anteriorally unable to be visualized. Palpably normal, though firm to touch  Uterus: Large conglomerate mass in central pelvis 15-20cm, minimally mobile, predominantly in anterior pelvis. Not appreciated on RV exam.  Adnexa:  Mass as described above Rectal  Good tone, no masses no cul de sac nodularity.  Extremities  Anasarca. Warm and well perfused extremities.   Donaciano Eva, MD  07/01/2015, 6:04 PM

## 2015-07-01 NOTE — Progress Notes (Addendum)
[facility]  Subjective  - Doing well sensation is intact and improved from pre-op.   Objective 136/54 98 97.8 F (36.6 C) (Oral) 18 96%  Intake/Output Summary (Last 24 hours) at 07/01/15 0926 Last data filed at 06/30/15 2000  Gross per 24 hour  Intake 1472.25 ml  Output      0 ml  Net 1472.25 ml    Doppler biphasic signals PT/peroneal Active range of motion of toes and ankle intact Incisions healing well, mild ecchymosis   Assessment/Planning: POD # 4 left lower extremity embolectomy   WBC increasing 18.7 currently Anemia stable 9.2 Oncology following pelvic biopsy    [facility] 07/01/2015 9:26 AM --  Laboratory Lab Results:  Recent Labs  06/30/15 0200 07/01/15 0245  WBC 17.3* 18.6*  HGB 9.6* 9.2*  HCT 28.8* 28.3*  PLT 167 190   BMET  Recent Labs  06/29/15 0635 06/30/15 0200  NA 137 136  K 3.2* 3.2*  CL 106 104  CO2 24 19*  GLUCOSE 100* 104*  BUN 11 12  CREATININE 0.59 0.60  CALCIUM 6.6* 7.0*    COAG Lab Results  Component Value Date   INR 1.33 06/27/2015   INR 1.23 06/26/2015   INR 1.41 06/25/2015   PROTIME 40.8* 06/24/2015   PROTIME 27.6* 06/16/2015   PROTIME 34.8* 06/13/2015   No results found for: PTT    Incisions ok.  Ambulated today CT shows resolution of iliac, profunda and popliteal thrombus.  No mass effect from pelvic lesion.  Would continue with anticoagulation.  Would not recommend stenting of iliac artery.  Appreciate heme-onc assistance  Susan Reed

## 2015-07-01 NOTE — Progress Notes (Signed)
fmla forms were placed in box for son and daughter,

## 2015-07-01 NOTE — Progress Notes (Signed)
Utilization review completed.  

## 2015-07-01 NOTE — Progress Notes (Signed)
IP PROGRESS NOTE  Subjective:   The left foot pain is better.  Objective: Vital signs in last 24 hours: Blood pressure 136/54, pulse 98, temperature 97.8 F (36.6 C), temperature source Oral, resp. rate 18, height 4\' 11"  (1.499 m), weight 112 lb 14 oz (51.2 kg), SpO2 96 %.  Intake/Output from previous day: 02/06 0701 - 02/07 0700 In: 1845.5 [P.O.:700; I.V.:1145.5] Out: -   Physical Exam:   Abdomen:   Mass filling the lower abdomen/pelvis Extremities:  Surgical wounds with ecchymoses at the left groin and left lower leg, the left foot is warm.   Portacath/PICC-without erythema  Lab Results:  Recent Labs  06/30/15 0200 07/01/15 0245  WBC 17.3* 18.6*  HGB 9.6* 9.2*  HCT 28.8* 28.3*  PLT 167 190    BMET  Recent Labs  06/29/15 0635 06/30/15 0200  NA 137 136  K 3.2* 3.2*  CL 106 104  CO2 24 19*  GLUCOSE 100* 104*  BUN 11 12  CREATININE 0.59 0.60  CALCIUM 6.6* 7.0*    Medications: I have reviewed the patient's current medications.  Assessment/Plan: 1. Stage IIIc (T4 N2) poorly differentiated adenocarcinoma of the cecum status post right colectomy 06/24/2011. She began adjuvant Xeloda chemotherapy on 07/26/2011. She completed the eighth and final cycle beginning 12/21/2011. 2. History of anemia. Question if related to GI bleeding at presentation and then chemotherapy. 3. COPD. 4. Family history multiple cancers. 5. History of anorexia/weight loss.  6. Report of balance difficulty and numbness in the feet when here 11/24/2011. Vtamin B 12 level in low normal range on 12/20/2011. Repeat level today was normal on 01/18/2012. 7. History of hand-foot syndrome secondary to Xeloda. Resolved. 8. History of vertigo. 9. Depression-she was started on a trial of Remeron following office visit 02/21/2012 with significant improvement. She is currently taking Remeron 7.5 mg daily. 10. Mildly elevated CEA July and August 2013-? Etiology. The CEA returned normal in December  of 2013. ? related to chronic bronchitis or other inflammation. 11. CT abdomen/pelvis 05/12/2015 revealed a large pelvic mass, apparently arising from the uterus status post CT-guided biopsy 06/26/2015.  Pathology revealed limited diagnostic material, differential diagnosis includes a leiomyoma or leiomyosarcoma. 12. Elevated prothrombin time; factor VII decreased. Received vitamin K 10 mg 06/12/2015, 06/13/2015 and 06/14/2015; PT/INR improved 06/16/2015, elevated PT/INR 06/24/2015-improved with additional vitamin K 13.  admission  06/27/2015 with an ischemic left lower leg /foot -status post an embolectomy procedure , maintained on heparin   Susan Reed appears stable. The biopsy from the pelvic mass is not conclusive. I discussed the case with pathology. They indicate there is a limited amount of tissue. There is no evidence of metastatic carcinoma. The pathologist feels the mass most likely represents a leiomyoma or leiomyosarcoma, but they need more tissue to make a definitive diagnosis.  I discussed the biopsy results with Susan Reed and her family. They remain interested and confirming a diagnosis. They are also interested in removal of the mass. I discussed the case with Dr. Denman George yesterday and she feels Susan Reed is not a good surgical candidate. We will ask Dr. Denman George to discuss this with Susan Reed and her family again.  She continues heparin anticoagulation. We can consider a transition to heparin, but this may be difficult with the baseline elevated PT.         LOS: 4 days   Betsy Coder, MD   07/01/2015, 10:03 AM

## 2015-07-01 NOTE — Progress Notes (Signed)
I placed forms for dr.sherrill to sign

## 2015-07-01 NOTE — Progress Notes (Signed)
I left a message for eugene and Narda Amber to let them know their forms were faxed and a copy if ready for their pick at front desk with ms. [place]- [employer] faxed [phone] and eugene's faxed [phone]

## 2015-07-01 NOTE — Progress Notes (Signed)
Physical Therapy Treatment Patient Details Name: Erminie Mcelmurray MRN: [id number] DOB: 02-Nov-1924 Today's Date: 07/01/2015    History of Present Illness pt presents with L Fem-pop bypass with re-occlusion ~4hrs after surgery.  pt with hx of COPD, Anemia, HTN, Gluacoma, Colon CA, Anxiety, and Depression.      PT Comments    Progressing slowly, limited today by diarrhea and pain from hemorrhoids.  Follow Up Recommendations  Home health PT;Supervision/Assistance - 24 hour     Equipment Recommendations  None recommended by PT    Recommendations for Other Services       Precautions / Restrictions Precautions Precautions: Fall Restrictions Weight Bearing Restrictions: No    Mobility  Bed Mobility Overal bed mobility: Needs Assistance Bed Mobility: Supine to Sit     Supine to sit: Mod assist     General bed mobility comments: bridged to EOB with less assist today, but still needed mod assist  to sit up.  Transfers Overall transfer level: Needs assistance Equipment used: Rolling walker (2 wheeled);1 person hand held assist Transfers: Sit to/from [facility] Sit to Stand: Mod assist Stand pivot transfers: Mod assist       General transfer comment: cues for hand placement and assist to come forward and up.  Ambulation/Gait Ambulation/Gait assistance: Min assist Ambulation Distance (Feet): 40 Feet Assistive device: Rolling walker (2 wheeled) Gait Pattern/deviations: Step-through pattern Gait velocity: slower   General Gait Details: short steps, still guarded, but able to maneuver RW   Stairs            Wheelchair Mobility    Modified Rankin (Stroke Patients Only)       Balance   Sitting-balance support: No upper extremity supported Sitting balance-Leahy Scale: Fair     Standing balance support: Single extremity supported;Bilateral upper extremity supported Standing balance-Leahy Scale: Poor Standing balance comment: reliant on the  RW                    Cognition Arousal/Alertness: Awake/alert Behavior During Therapy: WFL for tasks assessed/performed Overall Cognitive Status: Within Functional Limits for tasks assessed                      Exercises      General Comments        Pertinent Vitals/Pain Pain Assessment: Faces Faces Pain Scale: Hurts little more Pain Location: hemorrhoid pain Pain Descriptors / Indicators: Burning;Grimacing Pain Intervention(s): Monitored during session    Home Living                      Prior Function            PT Goals (current goals can now be found in the care plan section) Acute Rehab PT Goals Patient Stated Goal: L LE to heal PT Goal Formulation: With patient Time For Goal Achievement: 07/12/15 Potential to Achieve Goals: Good Progress towards PT goals: Progressing toward goals    Frequency  Min 3X/week    PT Plan Current plan remains appropriate    Co-evaluation             End of Session   Activity Tolerance: Patient tolerated treatment well Patient left: in chair;with call bell/phone within reach;with family/visitor present     Time: 1135-1204 PT Time Calculation (min) (ACUTE ONLY): 29 min  Charges:  $Gait Training: 8-22 mins $Therapeutic Activity: 8-22 mins  G Codes:      Freddy Spadafora, Tessie Fass 07/01/2015, 12:15 PM  07/01/2015  Donnella Sham, PT [phone]  (pager)

## 2015-07-01 NOTE — Care Management Note (Signed)
Case Management Note Marvetta Gibbons RN, BSN Unit 2W-Case Manager [phone]  Patient Details  Name: Susan Reed MRN: [id number] Date of Birth: 1924-12-10  Subjective/Objective:     Pt admitted with popliteal artery embolus s/p embolectomy                Action/Plan: PTA pt lived at home with daughter- was active with [facility]- for RN/aide/sw- will need resumption orders on discharge and add PT- pt has 3n1 and hospital bed at home- NCM to follow for d/c needs/planning  Expected Discharge Date:                  Expected Discharge Plan:  [place]  In-House Referral:     Discharge planning Services  CM Consult  Post Acute Care Choice:  Home Health, Resumption of Svcs/PTA Provider Choice offered to:  Adult Children  DME Arranged:    DME Agency:     HH Arranged:  RN, PT, Nurse's Aide, Social Work [employer] Agency:  [employer]  Status of Service:  In process, will continue to follow  Medicare Important Message Given:  Yes Date Medicare IM Given:    Medicare IM give by:    Date Additional Medicare IM Given:    Additional Medicare Important Message give by:     If discussed at [place] of Stay Meetings, dates discussed:    Additional Comments:  Dawayne Patricia, RN 07/01/2015, 10:01 AM

## 2015-07-01 NOTE — Progress Notes (Signed)
ANTICOAGULATION CONSULT NOTE - Follow Up Consult  Pharmacy Consult for Heparin Indication: s/p embolectomy  Allergies  Allergen Reactions  . Codeine Nausea And Vomiting  . Lactose Intolerance (Gi)   . Other Other (See Comments)    Seasonal Allergies    Patient Measurements: Height: 4\' 11"  (149.9 cm) Weight: 112 lb 14 oz (51.2 kg) IBW/kg (Calculated) : 43.2 Heparin Dosing Weight: 51 kg  Vital Signs: Temp: 97.8 F (36.6 C) (02/07 0528) Temp Source: Oral (02/07 0528) BP: 136/54 mmHg (02/07 0528) Pulse Rate: 98 (02/07 0528)  Labs:  Recent Labs  06/28/15 1223  06/29/15 0635  06/30/15 0200 06/30/15 1146 06/30/15 1815 07/01/15 0245  HGB  --   < > 9.4*  --  9.6*  --   --  9.2*  HCT  --   --  28.9*  --  28.8*  --   --  28.3*  PLT  --   --  161  --  167  --   --  190  APTT 29  --  40*  --   --   --   --   --   HEPARINUNFRC <0.10*  < > <0.10*  < > <0.10* 0.59 0.42 0.53  CREATININE  --   --  0.59  --  0.60  --   --   --   < > = values in this interval not displayed.  Estimated Creatinine Clearance: 33.2 mL/min (by C-G formula based on Cr of 0.6).  Assessment:  Anticoag: Hx of factor 7 deficiency. Heparin infusion for pulseless foot.  Now s/p embolectomy of left popliteal artery on 2/3. Left foot is warm and viable for now per PA note. Vit K PO PTA (received 10 mg daily while at [facility]). HL 0.59 now in goal. Hgb baseline 11s-12s now down to 9.2. Heparin levels now 0.59, 0.42, 0.53 in goal range x 3. - 2/6: OK to bolus if needed to get to theraputic levels (newly added admin instruction by Dr. Trula Slade)  Goal of Therapy:  Heparin level 0.3-0.7 units/ml Monitor platelets by anticoagulation protocol: Yes   Plan:  - Continue Iv heparin at 1300 units/hr.  -Daily HL/CBC -Monitor for s/sx of bleeding    Sidney Silberman S. Alford Highland, PharmD, BCPS Clinical Staff Pharmacist Pager [phone]  Eilene Ghazi Stillinger 07/01/2015,8:16 AM

## 2015-07-02 ENCOUNTER — Inpatient Hospital Stay (HOSPITAL_COMMUNITY): Payer: Medicare Other

## 2015-07-02 DIAGNOSIS — Z85038 Personal history of other malignant neoplasm of large intestine: Secondary | ICD-10-CM

## 2015-07-02 DIAGNOSIS — K644 Residual hemorrhoidal skin tags: Secondary | ICD-10-CM

## 2015-07-02 LAB — CBC
HCT: 31.1 % — ABNORMAL LOW (ref 36.0–46.0)
Hemoglobin: 10 g/dL — ABNORMAL LOW (ref 12.0–15.0)
MCH: 29.3 pg (ref 26.0–34.0)
MCHC: 32.2 g/dL (ref 30.0–36.0)
MCV: 91.2 fL (ref 78.0–100.0)
PLATELETS: 224 10*3/uL (ref 150–400)
RBC: 3.41 MIL/uL — AB (ref 3.87–5.11)
RDW: 19.2 % — AB (ref 11.5–15.5)
WBC: 21.9 10*3/uL — ABNORMAL HIGH (ref 4.0–10.5)

## 2015-07-02 LAB — HEPARIN LEVEL (UNFRACTIONATED): HEPARIN UNFRACTIONATED: 0.68 [IU]/mL (ref 0.30–0.70)

## 2015-07-02 LAB — PROTIME-INR
INR: 1.22 (ref 0.00–1.49)
Prothrombin Time: 15.6 seconds — ABNORMAL HIGH (ref 11.6–15.2)

## 2015-07-02 MED ORDER — HEPARIN (PORCINE) IN NACL 100-0.45 UNIT/ML-% IJ SOLN: 1300.0000 [IU]/h | INTRAMUSCULAR | Status: DC

## 2015-07-02 MED ORDER — HEPARIN (PORCINE) IN NACL 100-0.45 UNIT/ML-% IJ SOLN
1200.0000 [IU]/h | INTRAMUSCULAR | Status: DC
  Administered 2015-07-02 – 2015-07-03 (×2): 1300 [IU]/h via INTRAVENOUS
  Filled 2015-07-02 (×2): qty 250

## 2015-07-02 MED ORDER — MIDAZOLAM HCL 2 MG/2ML IJ SOLN
INTRAMUSCULAR | Status: AC
  Filled 2015-07-02: qty 2

## 2015-07-02 MED ORDER — LIDOCAINE HCL (PF) 1 % IJ SOLN
INTRAMUSCULAR | Status: AC
  Filled 2015-07-02: qty 10

## 2015-07-02 MED ORDER — DORZOLAMIDE HCL-TIMOLOL MAL 2-0.5 % OP SOLN
1.0000 [drp] | Freq: Two times a day (BID) | OPHTHALMIC | Status: DC
  Administered 2015-07-02 – 2015-07-07 (×10): 1 [drp] via OPHTHALMIC
  Filled 2015-07-02: qty 10

## 2015-07-02 MED ORDER — MIDAZOLAM HCL 2 MG/2ML IJ SOLN
INTRAMUSCULAR | Status: AC | PRN
  Administered 2015-07-02: 1 mg via INTRAVENOUS

## 2015-07-02 MED ORDER — FENTANYL CITRATE (PF) 100 MCG/2ML IJ SOLN
INTRAMUSCULAR | Status: AC
  Filled 2015-07-02: qty 2

## 2015-07-02 MED ORDER — FENTANYL CITRATE (PF) 100 MCG/2ML IJ SOLN
INTRAMUSCULAR | Status: AC | PRN
  Administered 2015-07-02: 25 ug via INTRAVENOUS

## 2015-07-02 NOTE — Sedation Documentation (Signed)
Daughter at bedside.

## 2015-07-02 NOTE — Progress Notes (Signed)
IP PROGRESS NOTE  Subjective:   She denies pain. Her family is at the bedside.  Objective: Vital signs in last 24 hours: Blood pressure 145/60, pulse 86, temperature 98.1 F (36.7 C), temperature source Oral, resp. rate 19, height 4\' 11"  (1.499 m), weight 112 lb 14 oz (51.2 kg), SpO2 98 %.  Intake/Output from previous day:    Physical Exam:   Abdomen:   Mass filling the lower abdomen/pelvis with associated tenderness, external hemorrhoids Extremities:  Surgical wounds with ecchymoses at the left groin and left lower leg, the left foot is warm.   Portacath/PICC-without erythema  Lab Results:  Recent Labs  07/01/15 0245 07/02/15 0524  WBC 18.6* 21.9*  HGB 9.2* 10.0*  HCT 28.3* 31.1*  PLT 190 224    BMET  Recent Labs  06/30/15 0200  NA 136  K 3.2*  CL 104  CO2 19*  GLUCOSE 104*  BUN 12  CREATININE 0.60  CALCIUM 7.0*    Medications: I have reviewed the patient's current medications.  Assessment/Plan: 1. Stage IIIc (T4 N2) poorly differentiated adenocarcinoma of the cecum status post right colectomy 06/24/2011. She began adjuvant Xeloda chemotherapy on 07/26/2011. She completed the eighth and final cycle beginning 12/21/2011. 2. History of anemia. Question if related to GI bleeding at presentation and then chemotherapy. 3. COPD. 4. Family history multiple cancers. 5. History of anorexia/weight loss.  6. Report of balance difficulty and numbness in the feet when here 11/24/2011. Vtamin B 12 level in low normal range on 12/20/2011. Repeat level today was normal on 01/18/2012. 7. History of hand-foot syndrome secondary to Xeloda. Resolved. 8. History of vertigo. 9. Depression-she was started on a trial of Remeron following office visit 02/21/2012 with significant improvement. She is currently taking Remeron 7.5 mg daily. 10. Mildly elevated CEA July and August 2013-? Etiology. The CEA returned normal in December of 2013. ? related to chronic bronchitis or other  inflammation. 11. CT abdomen/pelvis 05/12/2015 revealed a large pelvic mass, apparently arising from the uterus status post CT-guided biopsy 06/26/2015.  Pathology revealed limited diagnostic material, differential diagnosis includes a leiomyoma or leiomyosarcoma. 12. Elevated prothrombin time; factor VII decreased. Received vitamin K 10 mg 06/12/2015, 06/13/2015 and 06/14/2015; PT/INR improved 06/16/2015, elevated PT/INR 06/24/2015-improved with additional vitamin K 13.  admission  06/27/2015 with an ischemic left lower leg /foot -status post an embolectomy procedure , maintained on heparin 14. External hemorrhoids   Susan Reed appears unchanged. Dr. Denman George saw her yesterday. She does not recommend surgery. Dr. Denman George recommends Hospice care. I agree with Dr. 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  Susan Reed and her family are interested in pursuing another biopsy to arrive at a more definitive diagnosis and they would like to consider a second surgical opinion. I explained she would likely need to go out of town to seek another GYN oncology opinion.  I will discuss plans for anticoagulation with Dr. Trula Slade and we will consider asking interventional radiology to perform another biopsy of the pelvic mass.         LOS: 5 days   Betsy Coder, MD   07/02/2015, 8:38 AM

## 2015-07-02 NOTE — Sedation Documentation (Signed)
Report called to Clayborne Dana RN

## 2015-07-02 NOTE — Sedation Documentation (Signed)
Patient is resting comfortably. Vitals stable. 

## 2015-07-02 NOTE — Progress Notes (Addendum)
[facility]  Subjective  - Doing well, tired and not much appetite.     Objective 145/60 86 98.1 F (36.7 C) (Oral) 19 98% No intake or output data in the 24 hours ending 07/02/15 0811  Left leg incisions are healing well, groin soft. Foot warm active range of motion intact sensation intact well perfused.  Assessment/Planning: POD # 5 left lower extremity embolectomy  WBC increased to 21.9 Afebrile  HGB 10.0 Mobility encouraged as well as nutrition.  Loose stools C dif negative  Susan Reed, Susan Reed 07/02/2015 8:11 AM --  Laboratory Lab Results:  Recent Labs  07/01/15 0245 07/02/15 0524  WBC 18.6* 21.9*  HGB 9.2* 10.0*  HCT 28.3* 31.1*  PLT 190 224   BMET  Recent Labs  06/30/15 0200  NA 136  K 3.2*  CL 104  CO2 19*  GLUCOSE 104*  BUN 12  CREATININE 0.60  CALCIUM 7.0*    COAG Lab Results  Component Value Date   INR 1.33 06/27/2015   INR 1.23 06/26/2015   INR 1.41 06/25/2015   PROTIME 40.8* 06/24/2015   PROTIME 27.6* 06/16/2015   PROTIME 34.8* 06/13/2015   No results found for: PTT    I agree with the above.

## 2015-07-02 NOTE — Progress Notes (Signed)
ANTICOAGULATION CONSULT NOTE - Follow Up Consult  Pharmacy Consult for Heparin Indication: s/p embolectomy LLE  Allergies  Allergen Reactions  . Codeine Nausea And Vomiting  . Lactose Intolerance (Gi)   . Other Other (See Comments)    Seasonal Allergies    Patient Measurements: Height: 4\' 11"  (149.9 cm) Weight: 112 lb 14 oz (51.2 kg) IBW/kg (Calculated) : 43.2 Heparin Dosing Weight: 51 kg  Vital Signs: Temp: 98.1 F (36.7 C) (02/08 0504) Temp Source: Oral (02/08 0504) BP: 145/60 mmHg (02/08 0504) Pulse Rate: 86 (02/08 0504)  Labs:  Recent Labs  06/30/15 0200  06/30/15 1815 07/01/15 0245 07/02/15 0524  HGB 9.6*  --   --  9.2* 10.0*  HCT 28.8*  --   --  28.3* 31.1*  PLT 167  --   --  190 224  HEPARINUNFRC <0.10*  < > 0.42 0.53 0.68  CREATININE 0.60  --   --   --   --   < > = values in this interval not displayed.  Estimated Creatinine Clearance: 33.2 mL/min (by C-G formula based on Cr of 0.6).   Medications:  Scheduled:  . cholecalciferol  1,000 Units Oral Daily  . cycloSPORINE  1 drop Both Eyes BID  . docusate sodium  100 mg Oral Daily  . dorzolamide-timolol  1 drop Both Eyes BID  . feeding supplement (ENSURE ENLIVE)  237 mL Oral Q24H  . levothyroxine  50 mcg Oral Once per day on Sun Tue Thu Sat   And  . levothyroxine  75 mcg Oral Once per day on Mon Wed Fri  . mirtazapine  7.5 mg Oral QHS  . multivitamin with minerals  1 tablet Oral Daily  . omega-3 acid ethyl esters  1 g Oral Daily  . pantoprazole  40 mg Oral Daily  . protein supplement  1 scoop Oral TID WC   Infusions:  . sodium chloride 75 mL/hr at 06/30/15 1000    Assessment: 82 yo F on heparin at 1300 units/hr for recent embolus s/p embolectomy.  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  GYN ONC states patient is not a surgical candidate however, patient and family would like a second opinion including repeat biopsy.  Plan for repeat biopsy today in IR.  Heparin stopped at 0945 in  anticipation of procedure.  Goal of Therapy:  Heparin level 0.3-0.7 units/ml Monitor platelets by anticoagulation protocol: Yes   Plan:  Heparin on hold for now. Follow-up plans to restart after biopsy. Follow-up plans for long-term anticoagulation.  [employer], Pharm.D., BCPS Clinical Pharmacist Pager [phone] 07/02/2015 10:07 AM

## 2015-07-02 NOTE — Care Management Note (Signed)
Case Management Note  Patient Details  Name: Susan Reed MRN: [id number] Date of Birth: 22-Mar-1925  Subjective/Objective:    82 y.o. F admitted 06/27/2015 for LLE Ischemia. Embolectomy 06/27/2015. Active with [facility] prior to this admission and will be a resumption of care. (HHRN, [place], HHOT, and Aide).                 Action/Plan:CM will continue to follow for discharge needs.    Expected Discharge Date:                  Expected Discharge Plan:  [place]  In-House Referral:     Discharge planning Services  CM Consult  Post Acute Care Choice:  Home Health, Resumption of Svcs/PTA Provider Choice offered to:  Adult Children  DME Arranged:    DME Agency:     HH Arranged:  RN, PT, Nurse's Aide, Social Work, OT [employer] Agency:  [employer]  Status of Service:  In process, will continue to follow  Medicare Important Message Given:  Yes Date Medicare IM Given:    Medicare IM give by:    Date Additional Medicare IM Given:    Additional Medicare Important Message give by:     If discussed at [place] of Stay Meetings, dates discussed:    Additional Comments:  Delrae Sawyers, RN 07/02/2015, 3:13 PM

## 2015-07-02 NOTE — Sedation Documentation (Signed)
Patient is resting comfortably. Vital signs stable. 

## 2015-07-02 NOTE — Sedation Documentation (Signed)
Patient is resting comfortably. Vitals stable, no complaints of pain at this time.

## 2015-07-02 NOTE — Progress Notes (Signed)
Attempted to ambulate patient.  She is very weak and complains of dizziness.  Difficulty transferring from bed to [facility] without considerable assistance.  Lennie Muckle, PA notified.  Goal is to get patient moving, encourage leg lifts in bed, sitting up at bedside, etc.

## 2015-07-02 NOTE — Consult Note (Signed)
Chief Complaint: Patient was seen in consultation today for pelvic mass biopsy Chief Complaint  Patient presents with  . Leg Swelling   at the request of Dr Benay Spice  Referring Physician(s): Dr Benay Spice  History of Present Illness: Susan Reed is a 82 y.o. female   Pt with Hx colon cancer -- colectomy 2013 Admitted 2/3 with Left low leg ischemia; embolectomy 06/27/2015; on heparin drip Pt with weight loss; anorexia; abdominal pain and distension Known pelvic mass Core bx 06/25/15: limited material; "smooth muscle tissue" Dr Denman George consulted from GYN service-- recommending Hospice Dr Benay Spice has asked for another biopsy and reviewed with Dr Vernard Gambles He approves biopsy.  I have seen and examined pt.   Past Medical History  Diagnosis Date  . Anemia   . COPD (chronic obstructive pulmonary disease) ([place])   . Hyperlipidemia   . Osteoporosis   . Hypertension   . Glaucoma   . Hypothyroidism   . Blood transfusion     2009  . Colon cancer ([place])     colon cancer   . Rectal bleeding   . Abdominal bloating   . Allergy 08/09/12    codeine and lactose  . Anxiety   . Depression   . Blood transfusion without reported diagnosis 2009    5 years ago for anemia  . Cataract     Past Surgical History  Procedure Laterality Date  . Tonsillectomy    . Right colectomy  2013  . Band hemorrhoidectomy      by Dr. Harlow Asa  . Cataract extraction w/ intraocular lens implant Right 2008  . Colon surgery    . Patch angioplasty Left 06/27/2015    Procedure: PATCH ANGIOPLASTY;  Surgeon: [name], MD;  Location: [facility];  Service: Vascular;  Laterality: Left;  . Embolectomy Left 06/27/2015    Procedure: EMBOLECTOMY LEFT POPLITEAL ARTERY, LEFT FEMORAL ARTERY;  Surgeon: [name], MD;  Location: [facility];  Service: Vascular;  Laterality: Left;  . Intraoperative arteriogram Left 06/27/2015    Procedure: INTRA OPERATIVE ARTERIOGRAM;  Surgeon: [name], MD;  Location: [facility];  Service:  Vascular;  Laterality: Left;    Allergies: Codeine; Lactose intolerance (gi); and Other  Medications: Prior to Admission medications   Medication Sig Start Date End Date Taking? Authorizing Provider  Cholecalciferol (VITAMIN D-3 PO) Take 1,000 Units by mouth daily.    Yes Historical Provider, MD  cycloSPORINE (RESTASIS) 0.05 % ophthalmic emulsion Place 1 drop into both eyes 2 (two) times daily.    Yes Historical Provider, MD  dorzolamide-timolol (COSOPT) 22.3-6.8 MG/ML ophthalmic solution Place 1 drop into both eyes at bedtime.  02/11/12  Yes Historical Provider, MD  latanoprost (XALATAN) 0.005 % ophthalmic solution INSTILL 1 DROP IN BOTH EYES AT BEDTIME 04/27/15  Yes Historical Provider, MD  levothyroxine (SYNTHROID, LEVOTHROID) 50 MCG tablet TAKE 1 TABLET EVERY TUESDAY, THURSDAY, SATURDAY AND SUNDAY 04/28/15  Yes Historical Provider, MD  levothyroxine (SYNTHROID, LEVOTHROID) 75 MCG tablet Take 75 mcg by mouth See admin instructions. Takes on Monday, Wednesday, and Friday only.   Yes Historical Provider, MD  loperamide (IMODIUM) 2 MG capsule Take 2 mg by mouth as needed for diarrhea or loose stools. Reported on 05/30/2015   Yes Historical Provider, MD  megestrol (MEGACE ES) 625 MG/5ML suspension TAKE 5 MLS (625 MG TOTAL) BY MOUTH DAILY. 05/12/15  Yes Historical Provider, MD  mirtazapine (REMERON) 7.5 MG tablet Take 1 tablet (7.5 mg total) by mouth at bedtime. 03/04/15  Yes  Ladell Pier, MD  Multiple Vitamin (MULTIVITAMIN) capsule Take 1 capsule by mouth daily.   Yes Historical Provider, MD  Omega-3 Fatty Acids (FISH OIL) 1000 MG CAPS Take 3,000 mg by mouth daily.   Yes Historical Provider, MD  traMADol (ULTRAM) 50 MG tablet Take 1 tablet (50 mg total) by mouth every 6 (six) hours as needed. Patient taking differently: Take 50 mg by mouth every 6 (six) hours.  06/20/15  Yes Ladell Pier, MD  VITAMIN K PO Take 1 tablet by mouth daily.   Yes Historical Provider, MD  TOBRADEX ophthalmic ointment  Apply 1 application to eye daily as needed (rash).  10/11/12   Historical Provider, MD     Family History  Problem Relation Age of Onset  . Heart disease Father   . Cancer Sister     pt unaware of what kind, but knows it was female reproductive area  . Cancer Brother     lymphoma  . Colon cancer Neg Hx     Social History   Social History  . Marital Status: Widowed    Spouse Name: N/A  . Number of Children: 4  . Years of Education: N/A   Occupational History  . Retired     Social History Main Topics  . Smoking status: Never Smoker   . Smokeless tobacco: Never Used  . Alcohol Use: No     Comment: occ  . Drug Use: No  . Sexual Activity: Not Asked   Other Topics Concern  . None   Social History Narrative   Widow.  Originally from [place].  Lives in independent living.    Review of Systems: A 12 point ROS discussed and pertinent positives are indicated in the HPI above.  All other systems are negative.  Review of Systems  Constitutional: Positive for activity change, appetite change, fatigue and unexpected weight change. Negative for fever.  Respiratory: Negative for cough and shortness of breath.   Cardiovascular: Negative for chest pain.  Gastrointestinal: Positive for nausea, abdominal pain and abdominal distention.  Musculoskeletal: Positive for back pain.  Neurological: Positive for weakness.  Psychiatric/Behavioral: Negative for behavioral problems and confusion.    Vital Signs: BP 145/60 mmHg  Pulse 86  Temp(Src) 98.1 F (36.7 C) (Oral)  Resp 19  Ht 4\' 11"  (1.499 m)  Wt 112 lb 14 oz (51.2 kg)  BMI 22.79 kg/m2  SpO2 98%  Physical Exam  Constitutional: She is oriented to person, place, and time.  Cardiovascular: Normal rate and regular rhythm.   Pulmonary/Chest: Effort normal and breath sounds normal.  Abdominal: Soft. She exhibits distension. There is tenderness.  Musculoskeletal: Normal range of motion.  LLE embolectomy 06/27/15  Neurological: She  is alert and oriented to person, place, and time.  Skin: Skin is warm and dry.  Psychiatric: She has a normal mood and affect. Her behavior is normal. Judgment and thought content normal.  Nursing note and vitals reviewed.   Mallampati Score:  MD Evaluation Airway: WNL Heart: WNL Abdomen: WNL Chest/ Lungs: WNL ASA  Classification: 3 Mallampati/Airway Score: One  Imaging: Ct Angio Ao+bifem W/cm &/or Wo/cm  07/01/2015  CLINICAL DATA:  History of ischemic left lower extremity and status post embolectomy on 06/27/2015. Patient still having pain and swelling in the left lower extremity. EXAM: CT ANGIOGRAPHY OF ABDOMINAL AORTA WITH ILIOFEMORAL RUNOFF TECHNIQUE: Multidetector CT imaging of the abdomen, pelvis and lower extremities was performed using the standard protocol during bolus administration of intravenous contrast. Multiplanar CT image  reconstructions and MIPs were obtained to evaluate the vascular anatomy. CONTRAST:  22mL OMNIPAQUE IOHEXOL 350 MG/ML SOLN COMPARISON:  CTA examination 06/27/2015 FINDINGS: Aorta and iliacs: Calcified atherosclerotic plaque in the abdominal aorta without aneurysm or dissection. Mild-moderate narrowing at the celiac trunk could be related to the median arcuate ligament compression. SMA is patent. Bilateral renal arteries are patent. IMA is patent. Contrast opacification of the iliac arteries is not optimal. Calcified plaque in the right iliac arteries without significant plaque or stenosis. Left common iliac artery is patent and the thrombus in left common iliac artery has been removed. There is a small amount of nonocclusive peripheral thrombus in the proximal left common iliac artery. Difficult to exclude a small amount of clot at the left iliac bifurcation due to poor opacification of the iliac vessels. Left external iliac artery is patent. Right Lower Extremity: Right common femoral artery is patent. The right profunda femoral arteries are patent. Right SFA has  calcified plaque without significant stenosis or thrombosis. Right popliteal artery is patent. Limited evaluation of the arterial runoff due to venous contamination. There appears to be flow in the anterior tibial artery and peroneal artery. Question a diminutive posterior tibial artery. Flow across the ankle from the dorsalis pedis artery. Questionable flow at the posterior tibial artery at the ankle. Left Lower Extremity: Left common femoral femoral artery is patent. The left profunda femoral arteries are patent. Clot which was in the left profunda femoral artery has been removed or resolved. Calcified plaque in the left superficial femoral artery without significant stenosis or occlusion. Left popliteal artery is now patent. Minimal flow in the left anterior tibial artery. Limited evaluation of runoff vessels due to venous contamination. The appears to be flow in the peroneal artery and posterior tibial artery. Peroneal artery appears to occlude the mid calf. Primary runoff is from the posterior tibial artery. No significant reconstitution of the dorsalis pedis artery. Non arterial structures: There is subcutaneous edema in both lower extremities, left side greater than right. Bilateral knee effusions, left side greater than right. Soft tissue gas at the left upper calf from recent surgery. Expected soft tissue gas in the anterior left thigh and groin from recent surgery. Venous structures are not well opacified on this examination but there appears to be compression on the left common iliac vein from the large uterus/uterine mass. Atelectasis at both lung bases. No evidence for free intraperitoneal air. There is gas within the urinary bladder which is likely iatrogenic. The urinary bladder is displaced towards the right side from the uterus. Limited evaluation of the solid abdominal structures due to the arterial phase of contrast and motion artifact. Trace amount of perihepatic fluid. Evidence for multiple  calcified gallstones without significant distention. There is a small amount of ascites in the left upper quadrant adjacent to the spleen. Again noted is a prominent common bile duct measuring close to 1 cm but this area is poorly characterized. Evidence for hiatal hernia. No acute abnormalities involving the adrenal glands or kidneys. No acute abnormality to the pancreas or spleen. Again noted are enlarged retroperitoneal lymph nodes. Few periaortic lymph nodes are again noted. Largest lymph node is along the right external iliac chain measuring 2.1 cm in the short axis and similar to the recent comparison examination. Again noted is a prominent left external iliac lymph node. Again noted is the large heterogeneous uterus with multiple low-density cystic/necrotic areas. Uterus measures 14.2 x 17.4 x 13.8 cm. Again noted is marked disc space narrowing  at L5-S1. Evidence of prior hemicolectomy. There is no significant bowel dilatation. Review of the MIP images confirms the above findings. IMPRESSION: Markedly improved arterial flow in the left lower extremity. Large amount of clot in the left common iliac artery has been removed. There may be a small amount of residual peripheral thrombus in this area and difficult to exclude a small amount of nonocclusive thrombus at the left iliac bifurcation as described. The clot in the left profunda femoral artery and left popliteal artery have been removed or resolved. Evaluation of the runoff vessels is limited due to venous contamination on this examination. There appears to be underlying runoff disease. Primary runoff in the left lower extremity is from the posterior tibial artery. There appears to be 2 vessel runoff in the right lower extremity, dominant vessel is the right anterior tibial artery. Extensive subcutaneous edema in both lower extremities, left side greater than right. Limited evaluation for deep vein thrombosis on this examination but suspect that the lower  extremity swelling is at least partially related to compression of the iliac veins from the massively enlarged uterus. Expected postoperative changes in the left lower extremity. Massive uterus which is very heterogeneous. In addition, there are abnormal retroperitoneal lymph nodes. Findings are concerning for a neoplastic process. Prior uterine biopsy was indeterminate but leiomyosarcoma is in the differential diagnosis. Small amount of ascites in the upper abdomen which is new. Electronically Signed   By: Markus Daft M.D.   On: 07/01/2015 11:11   Ct Angio Ao+bifem W/cm &/or Wo/cm  06/27/2015  CLINICAL DATA:  No pulses in the LEFT leg.  Recent abdominal biopsy. EXAM: CT ANGIOGRAPHY OF ABDOMINAL AORTA WITH ILIOFEMORAL RUNOFF TECHNIQUE: Multidetector CT imaging of the abdomen, pelvis and lower extremities was performed using the standard protocol during bolus administration of intravenous contrast. Multiplanar CT image reconstructions and MIPs were obtained to evaluate the vascular anatomy. CONTRAST:  144mL OMNIPAQUE IOHEXOL 350 MG/ML SOLN COMPARISON:  CT abdomen 06/25/2015 FINDINGS: Aorta: Scattered intimal calcification of the aorta which is non aneurysmal. The celiac and SMA are patent. Bilateral single renal arteries are patent. IMA is patent. 19 mm segment of CLOT WITHIN THE LEFT COMMON ILIAC ARTERY just distal the bifurcation (image 62, series 5). The distal common iliac reconstitutes. The LEFT external iliac is patent. RIGHT common iliac and external iliac are patent. Right Lower Extremity: Common femoral veins pain. RIGHT profunda is patent. Popliteal artery and posterior tibial artery is patent to the level of the ankle. The lower extremity arteries are difficult to follow passed the ankle. Left Lower Extremity: LEFT common femoral artery is patent. There is OCCLUSION OF THE PROFUNDA artery on the LEFT (image 126, series 5). The superficial femoral artery is patent. There is HIGH-GRADE OCCLUSION OF THE  POPLITEAL ARTERY (image 232, series 5. There is no significant reconstitution in the lower extremity arteries of the calf or ankle or foot. Extra vascular findings : Pulmonary nodule at the RIGHT lung base measuring 4 mm (image number 1 series 5). Bibasilar atelectasis. No focal hepatic lesion. The gallbladder is irregular with multiple gallstones and sludge. Common bile duct is mildly dilated. Pancreas is normal. Spleen is normal. Adrenal glands, kidneys, bladder normal. The bladder is displaced rightward by the pelvic mass. The stomach is large hiatal hernia. There is irregular thickening in the proximal aspect of the stomach. Small bowel colon rectum are normal. There are multiple retroperitoneal periaortic lymph nodes. For example 13 mm lymph node between the aorta and IVC. In  the pelvis, large irregular enhancing pelvic mass extending from the uterus measuring 13 cm by 15 cm in axial dimension. Review of the MIP images confirms the above findings. IMPRESSION: 1. Partially occlusive thrombus within the proximal LEFT common iliac artery. 2. High-grade occlusion of the LEFT profunda artery. 3. High-grade occlusion of the LEFT popliteal artery. 4. The arteries of the RIGHT lower extremity are difficult to follow up below the ankle. 5. Large irregular pelvic mass with differential including leiomyoma versus leiomyosarcoma. 6. Probable retroperitoneal adenopathy. 7. Thickening of the proximal stomach at the hiatal hernia. Cannot exclude a gastric lesion. 8. Sludge and stones within the gallbladder which has an irregular appearance. 9. RIGHT lower lobe pulmonary nodule. [facility]  at20:15. Electronically Signed   By: Suzy Bouchard M.D.   On: 06/27/2015 20:34   Ct Biopsy  06/26/2015  INDICATION: Pelvic mass.  History of colon carcinoma. EXAM: CT BIOPSY MEDICATIONS: None. ANESTHESIA/SEDATION: Fentanyl 0.5 mcg IV; Versed 25 mg IV Moderate Sedation Time:  6 The patient was  continuously monitored during the procedure by the interventional radiology nurse under my direct supervision. FLUOROSCOPY TIME:  Fluoroscopy Time:  minutes  seconds ( mGy). COMPLICATIONS: None immediate. PROCEDURE: Informed written consent was obtained from the patient after a thorough discussion of the procedural risks, benefits and alternatives. All questions were addressed. Maximal Sterile Barrier Technique was utilized including caps, mask, sterile gowns, sterile gloves, sterile drape, hand hygiene and skin antiseptic. A timeout was performed prior to the initiation of the procedure. Under CT guidance, a(n) 17 gauge guide needle was advanced into the large pelvic mass. Subsequently 3 18 gauge core biopsies were obtained. The guide needle was removed. Post biopsy images demonstrate no hemorrhage. Patient tolerated the procedure well without complication. Vital sign monitoring by nursing staff during the procedure will continue as patient is in the special procedures unit for post procedure observation. The images document guide needle placement within the large pelvic mass. Post biopsy images demonstrate no evidence of hemorrhage. IMPRESSION: Successful CT-guided pelvic mass core biopsy. Electronically Signed   By: Marybelle Killings M.D.   On: 06/26/2015 08:17   Dg Ang/ext/uni/or Left  06/30/2015  CLINICAL DATA:  Left popliteal artery embolic occlusion. EXAM: LEFT ANG/EXT/UNI/ OR COMPARISON:  CTA on same date. FINDINGS: Intraoperative image shows widely patent popliteal artery. Opacified proximal posterior tibial and peroneal arteries are very small in caliber and show poor filling distally. Part of this appearance may relate to spasm after embolectomy. IMPRESSION: Widely patent left popliteal artery. Narrowed posterior tibial and peroneal arteries. Part of this appearance may relate to spasm intraoperatively. Electronically Signed   By: Aletta Edouard M.D.   On: 06/30/2015 08:23    Labs:  CBC:  Recent  Labs  06/29/15 0635 06/30/15 0200 07/01/15 0245 07/02/15 0524  WBC 16.5* 17.3* 18.6* 21.9*  HGB 9.4* 9.6* 9.2* 10.0*  HCT 28.9* 28.8* 28.3* 31.1*  PLT 161 167 190 224    COAGS:  Recent Labs  06/25/15 0004 06/25/15 0423 06/26/15 0353 06/27/15 1735 06/28/15 1223 06/29/15 0635  INR 1.64* 1.41 1.23 1.33  --   --   APTT 33  --   --  27 29 40*    BMP:  Recent Labs  06/26/15 0353 06/27/15 1746 06/28/15 0139 06/29/15 0635 06/30/15 0200  NA 137 135 136 137 136  K 3.3* 3.2* 3.1* 3.2* 3.2*  CL 104 100* 101 106 104  CO2 23  --  22 24 19*  GLUCOSE  93 148* 107* 100* 104*  BUN 12 17 7 11 12   CALCIUM 7.1*  --  7.5* 6.6* 7.0*  CREATININE 0.48 0.50 0.49 0.59 0.60  GFRNONAA >60  --  >60 >60 >60  GFRAA >60  --  >60 >60 >60    LIVER FUNCTION TESTS:  Recent Labs  05/21/15 1440 06/26/15 0353  BILITOT 0.53 0.9  AST 10 13*  ALT <9 7*  ALKPHOS 81 99  PROT 6.5 5.0*  ALBUMIN 2.8* 2.3*    TUMOR MARKERS:  Recent Labs  11/18/14 1501 05/21/15 1440 06/11/15 1225  CEA 2.2 1.2 1.1    Assessment and Plan:  Pelvic mass Hx colon ca Previous core bx: smooth muscle Now scheduled for re biopsy Risks and Benefits discussed with the patient including, but not limited to bleeding, infection, damage to adjacent structures or low yield requiring additional tests. All of the patient's questions were answered, patient is agreeable to proceed. Consent signed and in chart.  On Hep drip---off 1000 am Check INR stat Wbc 21.9 today  afeb  Thank you for this interesting consult.  I greatly enjoyed meeting Susan Reed and look forward to participating in their care.  A copy of this report was sent to the requesting provider on this date.  Electronically Signed: Jessabelle Markiewicz A 07/02/2015, 10:20 AM   I spent a total of 40 Minutes    in face to face in clinical consultation, greater than 50% of which was counseling/coordinating care for pelvic mass biopsy

## 2015-07-02 NOTE — Sedation Documentation (Signed)
Patient is resting comfortably. Daughter at beside.

## 2015-07-02 NOTE — Progress Notes (Signed)
Occupational Therapy Treatment Patient Details Name: Susan Reed MRN: [id number] DOB: 01/03/1925 Today's Date: 07/02/2015    History of present illness pt presents with L Fem-pop bypass with re-occlusion ~4hrs after surgery.  pt with hx of COPD, Anemia, HTN, [place], Colon CA, Anxiety, and Depression.     OT comments  Pt anxious, but agreeable to ambulation in her room. Walked 10 ft x2 with min assist. Required mod assist and verbal cues for sit to stand from chair and 3 in 1. Pt continues to require B UE support on walker for balance. Needing max assist to manage undergarments and for pericare.    Follow Up Recommendations  Home health OT;Supervision/Assistance - 24 hour    Equipment Recommendations       Recommendations for Other Services      Precautions / Restrictions Precautions Precautions: Fall       Mobility Bed Mobility   Bed Mobility: Sit to Supine       Sit to supine: Mod assist   General bed mobility comments: assist for LEs back into bed  Transfers Overall transfer level: Needs assistance Equipment used: Rolling walker (2 wheeled) Transfers: Sit to/from Stand Sit to Stand: Mod assist         General transfer comment: cues for hand placement and assist to come forward and up.    Balance             Standing balance-Leahy Scale: Poor                     ADL Overall ADL's : Needs assistance/impaired                         Toilet Transfer: Minimal assistance;[name];Ambulation;RW   Toileting- Clothing Manipulation and Hygiene: Maximal assistance;Sit to/from stand       Functional mobility during ADLs: Minimal assistance;Rolling walker General ADL Comments: Pt unable to release walker in standing for ADL, fearful of falling. Changed mesh panties and pad with max assist.      Vision                     Perception     Praxis      Cognition   Behavior During Therapy: Anxious Overall Cognitive Status:  Within Functional Limits for tasks assessed                       Extremity/Trunk Assessment               Exercises     Shoulder Instructions       General Comments      Pertinent Vitals/ Pain       Pain Assessment: Faces Faces Pain Scale: Hurts little more Pain Location: buttocks with pericare Pain Descriptors / Indicators: Sore Pain Intervention(s): Limited activity within patient's tolerance;Monitored during session  Home Living                                          Prior Functioning/Environment              Frequency Min 2X/week     Progress Toward Goals  OT Goals(current goals can now be found in the care plan section)  Progress towards OT goals: Progressing toward goals  Acute Rehab OT Goals Patient Stated Goal: L LE to heal  Plan Discharge plan remains appropriate    Co-evaluation                 End of Session Equipment Utilized During Treatment: Gait belt;Rolling walker   Activity Tolerance Patient limited by fatigue   Patient Left in bed;with call bell/phone within reach;with family/visitor present   Nurse Communication          Time: 1400-1436 OT Time Calculation (min): 36 min  Charges: OT General Charges $OT Visit: 1 Procedure OT Treatments $Self Care/Home Management : 23-37 mins  Malka So 07/02/2015, 2:39 PM [phone]

## 2015-07-02 NOTE — Sedation Documentation (Signed)
Pt  tolerated procedure well.

## 2015-07-02 NOTE — Procedures (Signed)
[place] core biopsy pelvic mass No complication No blood loss. See complete dictation in [facility].

## 2015-07-02 NOTE — Progress Notes (Signed)
ANTICOAGULATION CONSULT NOTE - Follow Up Consult  Pharmacy Consult for Heparin Indication: s/p embolectomy LLE  Allergies  Allergen Reactions  . Codeine Nausea And Vomiting  . Lactose Intolerance (Gi)   . Other Other (See Comments)    Seasonal Allergies    Patient Measurements: Height: 4\' 11"  (149.9 cm) Weight: 112 lb 14 oz (51.2 kg) IBW/kg (Calculated) : 43.2 Heparin Dosing Weight: 51 kg  Vital Signs: BP: 135/60 mmHg (02/08 1624) Pulse Rate: 85 (02/08 1624)  Labs:  Recent Labs  06/30/15 0200  06/30/15 1815 07/01/15 0245 07/02/15 0524 07/02/15 1045  HGB 9.6*  --   --  9.2* 10.0*  --   HCT 28.8*  --   --  28.3* 31.1*  --   PLT 167  --   --  190 224  --   LABPROT  --   --   --   --   --  15.6*  INR  --   --   --   --   --  1.22  HEPARINUNFRC <0.10*  < > 0.42 0.53 0.68  --   CREATININE 0.60  --   --   --   --   --   < > = values in this interval not displayed.  Estimated Creatinine Clearance: 33.2 mL/min (by C-G formula based on Cr of 0.6).   Medications:  Scheduled:  . cholecalciferol  1,000 Units Oral Daily  . cycloSPORINE  1 drop Both Eyes BID  . docusate sodium  100 mg Oral Daily  . dorzolamide-timolol  1 drop Both Eyes BID  . feeding supplement (ENSURE ENLIVE)  237 mL Oral Q24H  . fentaNYL      . levothyroxine  50 mcg Oral Once per day on Sun Tue Thu Sat   And  . levothyroxine  75 mcg Oral Once per day on Mon Wed Fri  . lidocaine (PF)      . midazolam      . mirtazapine  7.5 mg Oral QHS  . multivitamin with minerals  1 tablet Oral Daily  . omega-3 acid ethyl esters  1 g Oral Daily  . pantoprazole  40 mg Oral Daily  . protein supplement  1 scoop Oral TID WC   Infusions:  . sodium chloride 75 mL/hr at 06/30/15 1000    Assessment: 82 yo F on heparin at 1300 units/hr for recent embolus s/p embolectomy.  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  GYN ONC states patient is not a surgical candidate however, patient and family would like a second  opinion including repeat biopsy.  S/p repeat biopsy today in IR.   Deemed standard bleeding risk per IR and heparin to resume ~ 6 hours post-procedure  Goal of Therapy:  Heparin level 0.3-0.7 units/ml Monitor platelets by anticoagulation protocol: Yes   Plan:  Reinitiate heparin at 1300 units/hr at 2300 Initial HL with am labs Daily HL, CBC Monitor for s/sx of bleeding F/u long-term AC plans?  Levester Fresh, PharmD, BCPS, [facility] Clinical Pharmacist Pager [phone] 07/02/2015 5:10 PM

## 2015-07-03 LAB — CBC
HEMATOCRIT: 31.5 % — AB (ref 36.0–46.0)
HEMOGLOBIN: 10.5 g/dL — AB (ref 12.0–15.0)
MCH: 30.8 pg (ref 26.0–34.0)
MCHC: 33.3 g/dL (ref 30.0–36.0)
MCV: 92.4 fL (ref 78.0–100.0)
Platelets: 214 10*3/uL (ref 150–400)
RBC: 3.41 MIL/uL — ABNORMAL LOW (ref 3.87–5.11)
RDW: 19.5 % — AB (ref 11.5–15.5)
WBC: 22.8 10*3/uL — AB (ref 4.0–10.5)

## 2015-07-03 LAB — HEPARIN LEVEL (UNFRACTIONATED): Heparin Unfractionated: 0.36 IU/mL (ref 0.30–0.70)

## 2015-07-03 NOTE — Progress Notes (Signed)
Patient lying in bed, no distress pain or needs expressed at this time. Patient toileted and barrier cream applied. Call light within reach.

## 2015-07-03 NOTE — Care Management Important Message (Signed)
Important Message  Patient Details  Name: Susan Reed MRN: [id number] Date of Birth: [birth year]   Medicare Important Message Given:  Yes    Loann Quill 07/03/2015, 8:08 AM

## 2015-07-03 NOTE — Progress Notes (Signed)
Progress Note    07/03/2015 12:13 PM 6 Days Post-Op  Subjective:  Resting comfortably-no complaints  Afebrile HR 80's-90's NSR [id number] systolic 0000000 RA  Filed Vitals:   07/03/15 0554 07/03/15 0700  BP: 128/62 153/69  Pulse: 95 90  Temp: 98.8 F (37.1 C) 98 F (36.7 C)  Resp: 18 22    Physical Exam: Cardiac:  regular Lungs:  Non labored Incisions:  Left groin and left BK incisions are healing nicely Extremities:  Left foot with palpable DP; +pitting edema; sensation and motor are in tact.   CBC    Component Value Date/Time   WBC 22.8* 07/03/2015 0559   WBC 15.2* 05/21/2015 1440   RBC 3.41* 07/03/2015 0559   RBC 4.16 05/21/2015 1440   HGB 10.5* 07/03/2015 0559   HGB 12.0 05/21/2015 1440   HCT 31.5* 07/03/2015 0559   HCT 37.4 05/21/2015 1440   PLT 214 07/03/2015 0559   PLT 378 05/21/2015 1440   MCV 92.4 07/03/2015 0559   MCV 89.7 05/21/2015 1440   MCH 30.8 07/03/2015 0559   MCH 28.9 05/21/2015 1440   MCHC 33.3 07/03/2015 0559   MCHC 32.2 05/21/2015 1440   RDW 19.5* 07/03/2015 0559   RDW 15.7* 05/21/2015 1440   LYMPHSABS 1.5 06/27/2015 1735   LYMPHSABS 1.7 05/21/2015 1440   MONOABS 1.6* 06/27/2015 1735   MONOABS 1.2* 05/21/2015 1440   EOSABS 1.9* 06/27/2015 1735   EOSABS 1.6* 05/21/2015 1440   BASOSABS 0.0 06/27/2015 1735   BASOSABS 0.1 05/21/2015 1440    BMET    Component Value Date/Time   NA 136 06/30/2015 0200   NA 134* 06/11/2015 1225   K 3.2* 06/30/2015 0200   K 3.4* 06/11/2015 1225   CL 104 06/30/2015 0200   CO2 19* 06/30/2015 0200   CO2 25 06/11/2015 1225   GLUCOSE 104* 06/30/2015 0200   GLUCOSE 107 06/11/2015 1225   BUN 12 06/30/2015 0200   BUN 9.9 06/11/2015 1225   CREATININE 0.60 06/30/2015 0200   CREATININE 0.6 06/11/2015 1225   CALCIUM 7.0* 06/30/2015 0200   CALCIUM 8.0* 06/11/2015 1225   GFRNONAA >60 06/30/2015 0200   GFRAA >60 06/30/2015 0200    INR    Component Value Date/Time   INR 1.22 07/02/2015 1045   INR 3.40  06/24/2015 1439     Intake/Output Summary (Last 24 hours) at 07/03/15 1213 Last data filed at 07/03/15 0600  Gross per 24 hour  Intake  207.1 ml  Output      0 ml  Net  207.1 ml     Assessment:  82 y.o. female is s/p:  #1: Left common femoral profunda femoral and superficial femoral artery exposure #2 left iliac, superficial femoral, profundofemoral, common femoral, and popliteal embolectomy #3 patch angioplasty, left common femoral and superficial femoral artery #4: Intraoperative arteriogram #5: Left below-knee popliteal, anterior tibial, and tibioperoneal trunk exposure #6: Left popliteal, anterior tibial, posterior tibial embolectomy #7: Arteriotomy with primary closure, left popliteal artery, and left tibioperoneal trunk  6 Days Post-Op  Plan: -pt left leg with palpable DP and incisions are healing nicely.  Pathology of thrombus was negative -pt underwent US core Bx of pelvic mass yesterday-path pending -DVT prophylaxis:  Heparin gtt -WBC continues to increase-afebrile; hem/onc following -hgb stable (slightly up from yesterday)  Leontine Locket, PA-C [facility] [phone] 07/03/2015 12:13 PM    I agree with the above.  I spoke with Dr. Benay Spice earlier today.  There is consideration for evaluation at [facility]  for tumor removal, however this will be determined once pathology is back which will hopefully be tomorrow but could potentially be Monday.  We discussed option for anticoagulation and have decided to go with once daily Lovenox so that it can be discontinued should she and going for surgery for removal of her pelvic mass.  The patient will remain in the hospital through the weekend.  Susan Reed

## 2015-07-03 NOTE — Progress Notes (Signed)
Physical Therapy Treatment Patient Details Name: Susan Reed MRN: [id number] DOB: 04/25/1925 Today's Date: 07/03/2015    History of Present Illness pt presents with L Fem-pop bypass with re-occlusion ~4hrs after surgery.  pt with hx of COPD, Anemia, HTN, Gluacoma, Colon CA, Anxiety, and Depression.      PT Comments    Progressing slowly , limited by fatigue and pain from hemorrhoids more than surgical site.  Follow Up Recommendations  Home health PT;Supervision/Assistance - 24 hour     Equipment Recommendations  None recommended by PT    Recommendations for Other Services       Precautions / Restrictions Precautions Precautions: Fall    Mobility  Bed Mobility Overal bed mobility: Needs Assistance Bed Mobility: Sit to Sidelying         Sit to sidelying: Mod assist General bed mobility comments: assist for LEs back into bed  Transfers Overall transfer level: Needs assistance Equipment used: Rolling walker (2 wheeled) Transfers: Sit to/from Stand Sit to Stand: Mod assist Stand pivot transfers: Mod assist       General transfer comment: cues for hand placement and assist to come forward and up.  Ambulation/Gait Ambulation/Gait assistance: Min assist Ambulation Distance (Feet): 90 Feet Assistive device: Rolling walker (2 wheeled) Gait Pattern/deviations: Step-through pattern Gait velocity: slower   General Gait Details: short steps , guarded, but generally steady.  Not able to maneuver the RW today.without assist   Stairs            Wheelchair Mobility    Modified Rankin (Stroke Patients Only)       Balance Overall balance assessment: Needs assistance   Sitting balance-Leahy Scale: Fair       Standing balance-Leahy Scale: Poor                      Cognition Arousal/Alertness: Awake/alert Behavior During Therapy: Anxious;WFL for tasks assessed/performed Overall Cognitive Status: Within Functional Limits for tasks assessed                       Exercises      General Comments        Pertinent Vitals/Pain Pain Assessment: Faces Faces Pain Scale: Hurts even more Pain Location: hemorrhoids Pain Descriptors / Indicators: Burning;Discomfort;Grimacing    Home Living                      Prior Function            PT Goals (current goals can now be found in the care plan section) Acute Rehab PT Goals Patient Stated Goal: L LE to heal Time For Goal Achievement: 07/12/15 Potential to Achieve Goals: Good Progress towards PT goals: Progressing toward goals    Frequency  Min 3X/week    PT Plan Current plan remains appropriate    Co-evaluation             End of Session   Activity Tolerance: Patient tolerated treatment well;Patient limited by pain;Patient limited by fatigue Patient left: in chair;with call bell/phone within reach;with family/visitor present     Time: 1650-1715 PT Time Calculation (min) (ACUTE ONLY): 25 min  Charges:  $Gait Training: 8-22 mins $Therapeutic Activity: 8-22 mins                    G Codes:      [name], Tessie Fass 07/03/2015, 5:36 PM  07/03/2015  Donnella Sham, PT [phone]  (pager)

## 2015-07-03 NOTE — Progress Notes (Signed)
ANTICOAGULATION CONSULT NOTE - Follow Up Consult  Pharmacy Consult for Heparin Indication: s/p embolectomy LLE  Allergies  Allergen Reactions  . Codeine Nausea And Vomiting  . Lactose Intolerance (Gi)   . Other Other (See Comments)    Seasonal Allergies    Patient Measurements: Height: 4\' 11"  (149.9 cm) Weight: 112 lb 14 oz (51.2 kg) IBW/kg (Calculated) : 43.2 Heparin Dosing Weight: 51 kg  Vital Signs: Temp: 98.8 F (37.1 C) (02/09 0554) Temp Source: Oral (02/09 0554) BP: 128/62 mmHg (02/09 0554) Pulse Rate: 95 (02/09 0554)  Labs:  Recent Labs  07/01/15 0245 07/02/15 0524 07/02/15 1045 07/03/15 0559  HGB 9.2* 10.0*  --  10.5*  HCT 28.3* 31.1*  --  31.5*  PLT 190 224  --  214  LABPROT  --   --  15.6*  --   INR  --   --  1.22  --   HEPARINUNFRC 0.53 0.68  --  0.36    Estimated Creatinine Clearance: 33.2 mL/min (by C-G formula based on Cr of 0.6).  Assessment: 82 yo F on heparin at 1300 units/hr for recent embolus s/p embolectomy.  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  S/p repeat biopsy 2/8. Heparin resumed 6 hours post-biopsy. Heparin level therapeutic on 1300 units/hr. CBC stable, no bleeding noted.  Goal of Therapy:  Heparin level 0.3-0.7 units/ml Monitor platelets by anticoagulation protocol: Yes   Plan:  Continue heparin at 1300 units/hr  Daily HL, CBC Monitor for s/sx of bleeding F/u long-term AC plans?  Sherlon Handing, PharmD, BCPS Clinical pharmacist, pager [phone] 07/03/2015 6:48 AM

## 2015-07-03 NOTE — Progress Notes (Signed)
IP PROGRESS NOTE  Subjective:   She has pain this morning. She underwent a repeat biopsy of the pelvic mass yesterday.  Objective: Vital signs in last 24 hours: Blood pressure 149/72, pulse 83, temperature 98.8 F (37.1 C), temperature source Oral, resp. rate 18, height 4\' 11"  (1.499 m), weight 112 lb 14 oz (51.2 kg), SpO2 98 %.  Intake/Output from previous day: 02/08 0701 - 02/09 0700 In: 207.1 [P.O.:120; I.V.:87.1] Out: -   Physical Exam:   Abdomen:   Mass filling the lower abdomen/pelvis with associated tenderness, external hemorrhoids Extremities:  Surgical wounds with ecchymoses at the left groin and left lower leg, the left foot is warm.   Lab Results:  Recent Labs  07/02/15 0524 07/03/15 0559  WBC 21.9* 22.8*  HGB 10.0* 10.5*  HCT 31.1* 31.5*  PLT 224 214     Medications: I have reviewed the patient's current medications.  Assessment/Plan: 1. Stage IIIc (T4 N2) poorly differentiated adenocarcinoma of the cecum status post right colectomy 06/24/2011. She began adjuvant Xeloda chemotherapy on 07/26/2011. She completed the eighth and final cycle beginning 12/21/2011. 2. History of anemia. Question if related to GI bleeding at presentation and then chemotherapy. 3. COPD. 4. Family history multiple cancers. 5. History of anorexia/weight loss.  6. Report of balance difficulty and numbness in the feet when here 11/24/2011. Vtamin B 12 level in low normal range on 12/20/2011. Repeat level today was normal on 01/18/2012. 7. History of hand-foot syndrome secondary to Xeloda. Resolved. 8. History of vertigo. 9. Depression-she was started on a trial of Remeron following office visit 02/21/2012 with significant improvement. She is currently taking Remeron 7.5 mg daily. 10. Mildly elevated CEA July and August 2013-? Etiology. The CEA returned normal in December of 2013. ? related to chronic bronchitis or other inflammation. 11. CT abdomen/pelvis 05/12/2015 revealed a large  pelvic mass, apparently arising from the uterus status post CT-guided biopsy 06/26/2015.  Pathology revealed limited diagnostic material, differential diagnosis includes a leiomyoma or leiomyosarcoma. 12. Elevated prothrombin time; factor VII decreased. Received vitamin K 10 mg 06/12/2015, 06/13/2015 and 06/14/2015; PT/INR improved 06/16/2015, elevated PT/INR 06/24/2015-improved with additional vitamin K 13.  admission  06/27/2015 with an ischemic left lower leg /foot -status post an embolectomy procedure , maintained on heparin 14. External hemorrhoids   Ms. Silvernale appears unchanged. Her family requested a second opinion with the GYN oncology service. I discussed the case with Dr. Loletta Specter- Dianah Field yesterday. He agrees with Dr. Denman George that Ms. Roquemore most likely has leiomyosarcoma and is not a surgical candidate. We will need to refer her out of town if she would like another formal GYN oncology evaluation.  She underwent a repeat biopsy of the pelvic mass yesterday. We should have the pathology available by tomorrow. I will discuss the case with Dr. Trula Slade to decide on outpatient anticoagulation therapy.        LOS: 6 days   Betsy Coder, MD   07/03/2015, 1:41 PM

## 2015-07-04 ENCOUNTER — Other Ambulatory Visit: Payer: Self-pay

## 2015-07-04 DIAGNOSIS — R109 Unspecified abdominal pain: Secondary | ICD-10-CM

## 2015-07-04 LAB — CBC
HEMATOCRIT: 31.6 % — AB (ref 36.0–46.0)
Hemoglobin: 10 g/dL — ABNORMAL LOW (ref 12.0–15.0)
MCH: 29.3 pg (ref 26.0–34.0)
MCHC: 31.6 g/dL (ref 30.0–36.0)
MCV: 92.7 fL (ref 78.0–100.0)
PLATELETS: 206 10*3/uL (ref 150–400)
RBC: 3.41 MIL/uL — ABNORMAL LOW (ref 3.87–5.11)
RDW: 19.6 % — AB (ref 11.5–15.5)
WBC: 20.3 10*3/uL — AB (ref 4.0–10.5)

## 2015-07-04 LAB — HEPARIN LEVEL (UNFRACTIONATED): HEPARIN UNFRACTIONATED: 0.72 [IU]/mL — AB (ref 0.30–0.70)

## 2015-07-04 MED ORDER — ENOXAPARIN SODIUM 80 MG/0.8ML ~~LOC~~ SOLN
1.5000 mg/kg | SUBCUTANEOUS | Status: DC
  Administered 2015-07-04 – 2015-07-07 (×4): 75 mg via SUBCUTANEOUS
  Filled 2015-07-04 (×4): qty 0.8

## 2015-07-04 NOTE — Progress Notes (Signed)
ANTICOAGULATION CONSULT NOTE - Follow Up Consult  Pharmacy Consult for heparin Indication: s/p embolectomy   Labs:  Recent Labs  07/02/15 0524 07/02/15 1045 07/03/15 0559 07/04/15 0240  HGB 10.0*  --  10.5* 10.0*  HCT 31.1*  --  31.5* 31.6*  PLT 224  --  214 206  LABPROT  --  15.6*  --   --   INR  --  1.22  --   --   HEPARINUNFRC 0.68  --  0.36 0.72*     Assessment: 82yo female now slightly above goal on heparin after one level at low end of goal, likely accumulating.  Goal of Therapy:  Heparin level 0.3-0.7 units/ml   Plan:  Will increase heparin gtt slightly to 1200 units/hr and check level in [place], PharmD, BCPS  07/04/2015,3:09 AM

## 2015-07-04 NOTE — Progress Notes (Signed)
IP PROGRESS NOTE  Subjective:   She is lethargic this morning. She appears comfortable. Her daughter is at the bedside. Her daughter is the healthcare power of attorney.  Objective: Vital signs in last 24 hours: Blood pressure 143/54, pulse 82, temperature 98 F (36.7 C), temperature source Oral, resp. rate 18, height 4\' 11"  (1.499 m), weight 112 lb 14 oz (51.2 kg), SpO2 100 %.  Intake/Output from previous day: 02/09 0701 - 02/10 0700 In: 2470 [P.O.:2470] Out: -   Physical Exam:   Abdomen:   Mass filling the lower abdomen/pelvis with associated tenderness, external hemorrhoids Extremities:  Surgical wounds with ecchymoses at the left groin and left lower leg, the left foot is warm.   Lab Results:  Recent Labs  07/03/15 0559 07/04/15 0240  WBC 22.8* 20.3*  HGB 10.5* 10.0*  HCT 31.5* 31.6*  PLT 214 206     Medications: I have reviewed the patient's current medications.  Assessment/Plan: 1. Stage IIIc (T4 N2) poorly differentiated adenocarcinoma of the cecum status post right colectomy 06/24/2011. She began adjuvant Xeloda chemotherapy on 07/26/2011. She completed the eighth and final cycle beginning 12/21/2011. 2. History of anemia. Question if related to GI bleeding at presentation and then chemotherapy. 3. COPD. 4. Family history multiple cancers. 5. History of anorexia/weight loss.  6. Report of balance difficulty and numbness in the feet when here 11/24/2011. Vtamin B 12 level in low normal range on 12/20/2011. Repeat level today was normal on 01/18/2012. 7. History of hand-foot syndrome secondary to Xeloda. Resolved. 8. History of vertigo. 9. Depression-she was started on a trial of Remeron following office visit 02/21/2012 with significant improvement. She is currently taking Remeron 7.5 mg daily. 10. Mildly elevated CEA July and August 2013-? Etiology. The CEA returned normal in December of 2013. ? related to chronic bronchitis or other inflammation. 11. CT  abdomen/pelvis 05/12/2015 revealed a large pelvic mass, apparently arising from the uterus status post CT-guided biopsy 06/26/2015.  Pathology revealed limited diagnostic material, differential diagnosis includes a leiomyoma or leiomyosarcoma. 12. Elevated prothrombin time; factor VII decreased. Received vitamin K 10 mg 06/12/2015, 06/13/2015 and 06/14/2015; PT/INR improved 06/16/2015, elevated PT/INR 06/24/2015-improved with additional vitamin K 13.  admission  06/27/2015 with an ischemic left lower leg /foot -status post an embolectomy procedure , maintained on heparin 14. External hemorrhoids  Ms. Nettleton appears unchanged. I discussed the situation with her daughter. The pathology from the repeat biopsy is pending. We discussed referring her for a formal second opinion at [facility]. Her daughter reports Susan Reed does not wish to pursue another opinion or surgical intervention.  I discussed the case with Dr. Trula Slade yesterday. We will switch to Lovenox anticoagulation. Her daughter feels she can administer the Lovenox.  The daughter is interested in a [facility] referral. I will make this referral today. Her daughter be out of town until 07/07/2015. I think we should plan for discharge with home Hospice care on 07/07/2015.  I discussed CPR and ACLS issues with her daughter. She indicates Ms. Timian does not wish to undergo resuscitation measures. She will be placed on a no CODE BLUE status.        LOS: 7 days   Betsy Coder, MD   07/04/2015, 8:09 AM

## 2015-07-04 NOTE — Telephone Encounter (Signed)
Chart reviewed.  Last appt missed d/t admission.  POF sent.

## 2015-07-04 NOTE — Progress Notes (Signed)
   07/04/15 1329  PT Visit Information  Reason Eval/Treat Not Completed Pain limiting ability to participate;Fatigue/lethargy limiting ability to participate (Pt reports she wished to rest at this time,  will continue efforts if time permits.  )

## 2015-07-04 NOTE — Progress Notes (Signed)
Nutrition Follow-up  DOCUMENTATION CODES:   Non-severe (moderate) malnutrition in context of chronic illness  INTERVENTION:    Continue Ensure Enlive po BID, each supplement provides 350 kcal and 20 grams of protein  NUTRITION DIAGNOSIS:   Malnutrition related to chronic illness as evidenced by severe depletion of body fat, moderate depletions of muscle mass  GOAL:   Patient will meet greater than or equal to 90% of their needs  MONITOR:   PO intake, Supplement acceptance, Weight trends, Labs, Skin, I & O's  ASSESSMENT:   82 y.o. female who presents for evaluation of left foot ischemia. Pt began to have symptoms of numbness in her left foot about 9 am today. She was seen in the [place] ER and found to have no pulse. She was transferred to [facility] ER for further eval. Currently undergoing work up for pelvic tumor ? Recurrent CA or new. She has had anorexia and weight loss for several months. Other medical problems include hypertension, hyperlipid, COPD all of which have been stable.  Patient advanced to Heart Healthy diet.  PO intake 50-95% per flowsheet records.  Receiving oral nutrition supplements.  Malnutrition ongoing.  Plan is for patient to go home with Home Hospice.  Diet Order:  Diet Heart Room service appropriate?: Yes; Fluid consistency:: Thin  Skin:  Reviewed, no issues  Last BM:  2/10  Height:   Ht Readings from Last 1 Encounters:  06/28/15 4\' 11"  (1.499 m)    Weight:   Wt Readings from Last 1 Encounters:  06/28/15 112 lb 14 oz (51.2 kg)    Ideal Body Weight:  44.7 kg  BMI:  Body mass index is 22.79 kg/(m^2).  Estimated Nutritional Needs:   Kcal:  1400-1600  Protein:  70-80 grams  Fluid:  1.4-1.6 L/day  EDUCATION NEEDS:   No education needs identified at this time  Arthur Holms, RD, LDN Pager #: [phone] After-Hours Pager #: [phone]

## 2015-07-04 NOTE — Progress Notes (Addendum)
[facility] [place]  Subjective  - pleasant, comfortable.   Objective 143/54 82 98 F (36.7 C) (Oral) 18 100%  Intake/Output Summary (Last 24 hours) at 07/04/15 0818 Last data filed at 07/03/15 0900  Gross per 24 hour  Intake   2470 ml  Output      0 ml  Net   2470 ml    Left leg incisions healing well Foot warm to touch  Assessment/Planning: POD # 7  #1: Left common femoral profunda femoral and superficial femoral artery exposure #2 left iliac, superficial femoral, profundofemoral, common femoral, and popliteal embolectomy #3 patch angioplasty, left common femoral and superficial femoral artery #4: Intraoperative arteriogram #5: Left below-knee popliteal, anterior tibial, and tibioperoneal trunk exposure #6: Left popliteal, anterior tibial, posterior tibial embolectomy #7: Arteriotomy with primary closure, left popliteal artery, and left tibioperoneal trunk   Dr. Betsy Coder: " Ms. Ventrella appears unchanged. I discussed the situation with her daughter. The pathology from the repeat biopsy is pending. We discussed referring her for a formal second opinion at [facility]. Her daughter reports Ms. Metro does not wish to pursue another opinion or surgical intervention.  I discussed the case with Dr. Trula Slade yesterday. We will switch to Lovenox anticoagulation. Her daughter feels she can administer the Lovenox.  The daughter is interested in a [facility] referral. I will make this referral today. Her daughter be out of town until 07/07/2015. I think we should plan for discharge with home Hospice care on 07/07/2015.  I discussed CPR and ACLS issues with her daughter. She indicates Ms. Brazel does not wish to undergo resuscitation measures. She will be placed on a no CODE BLUE status."    Laurence Slate [facility] 07/04/2015 8:18 AM --  Laboratory Lab Results:  Recent Labs  07/03/15 0559 07/04/15 0240  WBC 22.8* 20.3*  HGB 10.5*  10.0*  HCT 31.5* 31.6*  PLT 214 206   BMET No results for input(s): NA, K, CL, CO2, GLUCOSE, BUN, CREATININE, CALCIUM in the last 72 hours.  COAG Lab Results  Component Value Date   INR 1.22 07/02/2015   INR 1.33 06/27/2015   INR 1.23 06/26/2015   PROTIME 40.8* 06/24/2015   PROTIME 27.6* 06/16/2015   PROTIME 34.8* 06/13/2015   No results found for: PTT    I agree with the above.  The patient remained stable.  Awaiting repeat biopsy results and ultimate disposition per Dr.Sherrill.  Anticipate discharge home on Monday.  Lovenox anticoagulation therapy to start today  Annamarie Major

## 2015-07-04 NOTE — Progress Notes (Signed)
Utilization review completed.  

## 2015-07-04 NOTE — Progress Notes (Signed)
   07/04/15 1046  PT Visit Information  Reason Eval/Treat Not Completed (daughter present in room reports her mother has just fallen asleep requesting PTA to return after lunch.  )

## 2015-07-04 NOTE — Progress Notes (Signed)
Physical Therapy Treatment Patient Details Name: Susan Reed MRN: [id number] DOB: [birth year] Today's Date: 07/04/2015    History of Present Illness pt presents with L Fem-pop bypass with re-occlusion ~4hrs after surgery.  pt with hx of COPD, Anemia, HTN, [place], Colon CA, Anxiety, and Depression.      PT Comments    Patient progressing well towards PT goals. Continues to require Min A for balance/safety during mobility. Demonstrates decreased endurance and fatigues with activity. Instructed pt in LE exercises to perform in the chair to improve strength. Encouraged daily walking with RN over the weekend. Will follow acutely.   Follow Up Recommendations  Home health PT;Supervision/Assistance - 24 hour     Equipment Recommendations  None recommended by PT    Recommendations for Other Services       Precautions / Restrictions Precautions Precautions: Fall Restrictions Weight Bearing Restrictions: No    Mobility  Bed Mobility               General bed mobility comments: Standing with RN upon PT arrival.   Transfers Overall transfer level: Needs assistance Equipment used: Rolling walker (2 wheeled) Transfers: Sit to/from Stand           General transfer comment: Controlled descent into chair with cues.   Ambulation/Gait Ambulation/Gait assistance: Min assist Ambulation Distance (Feet): 95 Feet Assistive device: Rolling walker (2 wheeled) Gait Pattern/deviations: Step-through pattern;Decreased stride length;Trunk flexed Gait velocity: slower   General Gait Details: Guarded gait with short steps; cues for upright posture. a few short standing rest breaks. HR 92-109 bpm. Able to manuever the Rw today without assist.    Stairs            Wheelchair Mobility    Modified Rankin (Stroke Patients Only)       Balance Overall balance assessment: Needs assistance Sitting-balance support: Feet supported;No upper extremity supported Sitting  balance-Leahy Scale: Fair     Standing balance support: During functional activity Standing balance-Leahy Scale: Poor Standing balance comment: Reliant on RW for support.                     Cognition Arousal/Alertness: Awake/alert Behavior During Therapy: WFL for tasks assessed/performed Overall Cognitive Status: Within Functional Limits for tasks assessed                      Exercises General Exercises - Lower Extremity Ankle Circles/Pumps: Both;15 reps;Seated Long Arc Quad: Both;10 reps;Seated Hip Flexion/Marching: Both;10 reps;Seated Heel Raises: Both;10 reps;Seated    General Comments General comments (skin integrity, edema, etc.): Daughter present in room during session.      Pertinent Vitals/Pain Pain Assessment: Faces Pain Score: 4  Pain Location: LLE Pain Descriptors / Indicators: Grimacing Pain Intervention(s): Monitored during session;Repositioned    Home Living                      Prior Function            PT Goals (current goals can now be found in the care plan section) Progress towards PT goals: Progressing toward goals    Frequency  Min 3X/week    PT Plan Current plan remains appropriate    Co-evaluation             End of Session Equipment Utilized During Treatment: Gait belt Activity Tolerance: Patient tolerated treatment well;Patient limited by fatigue Patient left: in chair;with call bell/phone within reach;with family/visitor present     Time: [id number]  PT Time Calculation (min) (ACUTE ONLY): 18 min  Charges:  $Gait Training: 8-22 mins                    G Codes:      Dianely Krehbiel A Malon Branton 07/04/2015, 3:46 PM  Wray Kearns, [place], DPT [phone]

## 2015-07-04 NOTE — Care Management Note (Addendum)
Case Management Note Marvetta Gibbons RN, BSN Unit 2W-Case Manager [phone]  Patient Details  Name: Susan Reed MRN: [id number] Date of Birth: [birth year]  Subjective/Objective:     Pt admitted with popliteal artery embolus s/p embolectomy                Action/Plan: PTA pt lived at home with daughter- was active with [facility]- for RN/aide/sw- will need resumption orders on discharge and add PT- pt has 3n1 and hospital bed at home- NCM to follow for d/c needs/planning  Expected Discharge Date:    07/07/15              Expected Discharge Plan:  Home w Hospice Care  In-House Referral:     Discharge planning Services  CM Consult  Post Acute Care Choice:  Hospice Choice offered to:  Adult Children, Patient  DME Arranged:    DME Agency:     HH Arranged:  RN, Disease Management HH Agency:  [facility]  Status of Service:  Completed, signed off  Medicare Important Message Given:  Yes Date Medicare IM Given:    Medicare IM give by:    Date Additional Medicare IM Given:    Additional Medicare Important Message give by:     If discussed at [place] of Stay Meetings, dates discussed:    Additional Comments:  07/04/15- 1000- Marvetta Gibbons RN, BSN - received referral for Home with Hospice- in to speak with pt and daughter at bedside- discussed home hospice confirmed with daughter Home Hospice choice for [facility]- also discussed DME needs- pt has been staying at daughters home- already has hospital bed, [name], raised toilet and RW- no other DME needed noted at this time- daughter's address is [address] [place] [address] contact # [phone] Referral call to [facility] for home hospice- with plan for d/c on Monday 07/07/15. Lisa with [facility] called to confirm that referral had been received- Lisa with [facility] contact # [phone]-  Call made to [facility]- to alert them that pt going home with Hospice- was told that they did not have pt as  an active pt. CM to continue to follow.    Dawayne Patricia, RN 07/04/2015, 10:47 AM

## 2015-07-04 NOTE — Consult Note (Addendum)
[facility] received referral for home hospice services after discharge from [facility]. Chart reviewed and met with daughter [place] to confirm interest and explain services. Note per chart review, plan is for patient to remain in hospital over weekend with possible discharge Monday. Confirmed no additional DME needs and plan to discharge to daughter's home. Address confirmed.   Will follow over weekend until discharge confirmed.   Thank you.  Erling Conte, [place] [facility] [place] [facility] [place]

## 2015-07-05 DIAGNOSIS — C55 Malignant neoplasm of uterus, part unspecified: Secondary | ICD-10-CM

## 2015-07-05 NOTE — Progress Notes (Signed)
IP PROGRESS NOTE  Subjective:   She is alert. No new complaint.  Objective: Vital signs in last 24 hours: Blood pressure 148/50, pulse 88, temperature 98.6 F (37 C), temperature source Oral, resp. rate 18, height '4\' 11"'$  (1.499 m), weight 120 lb 9.5 oz (54.7 kg), SpO2 96 %.  Intake/Output from previous day: 02/10 0701 - 02/11 0700 In: 360 [P.O.:360] Out: -   Physical Exam: Lungs: Few rales at the right anterior chest, no respiratory distress Cardiac: Regular rate and rhythm  Abdomen:   Mass filling the lower abdomen/pelvis with associated tenderness Extremities:  Surgical wounds with ecchymoses at the left groin and left lower leg, the left foot is warm.   Lab Results:  Recent Labs  07/03/15 0559 07/04/15 0240  WBC 22.8* 20.3*  HGB 10.5* 10.0*  HCT 31.5* 31.6*  PLT 214 206     Medications: I have reviewed the patient's current medications.  Assessment/Plan: 1. Stage IIIc (T4 N2) poorly differentiated adenocarcinoma of the cecum status post right colectomy 06/24/2011. She began adjuvant Xeloda chemotherapy on 07/26/2011. She completed the eighth and final cycle beginning 12/21/2011. 2. History of anemia. Question if related to GI bleeding at presentation and then chemotherapy. 3. COPD. 4. Family history multiple cancers. 5. History of anorexia/weight loss.  6. Report of balance difficulty and numbness in the feet when here 11/24/2011. Vtamin B 12 level in low normal range on 12/20/2011. Repeat level today was normal on 01/18/2012. 7. History of hand-foot syndrome secondary to Xeloda. Resolved. 8. History of vertigo. 9. Depression-she was started on a trial of Remeron following office visit 02/21/2012 with significant improvement. She is currently taking Remeron 7.5 mg daily. 10. Mildly elevated CEA July and August 2013-? Etiology. The CEA returned normal in December of 2013. ? related to chronic bronchitis or other inflammation. 11. CT abdomen/pelvis 05/12/2015  revealed a large pelvic mass, apparently arising from the uterus status post CT-guided biopsy 06/26/2015.  Pathology revealed limited diagnostic material, differential diagnosis includes a leiomyoma or leiomyosarcoma. 12. Elevated prothrombin time; factor VII decreased. Received vitamin K 10 mg 06/12/2015, 06/13/2015 and 06/14/2015; PT/INR improved 06/16/2015, elevated PT/INR 06/24/2015-improved with additional vitamin K 13.  admission  06/27/2015 with an ischemic left lower leg /foot -status post an embolectomy procedure , Lovenox started 07/04/2015. 14. External hemorrhoids  Ms. Lovecchio appears stable. She and her daughter met with the hospice social worker yesterday. The plan is for home Hospice care. I will consolidate her medications. She will continue Lovenox at discharge.  I discussed the case with Dr.Kish. 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 A final report should be available 07/07/2015 after additional immunohistochemical stains. I discussed the case with Dr. Trula Slade yesterday. We will switch to Lovenox anticoagulation. Her daughter feels she can administer the Lovenox.          LOS: 8 days   Betsy Coder, MD   07/05/2015, 10:02 AM

## 2015-07-05 NOTE — Progress Notes (Addendum)
[facility]  VASCULAR SURGERY ASSESSMENT AND PLAN:  Agree with no below. Left foot is warm and well perfused. Decisions look fine. On Lovenox.  Susan Mayo, MD, FACS Beeper [phone]  Subjective  - Hospice for home is being set up.     Objective 148/50 88 98.6 F (37 C) (Oral) 18 96%  Intake/Output Summary (Last 24 hours) at 07/05/15 0751 Last data filed at 07/04/15 2200  Gross per 24 hour  Intake    360 ml  Output      0 ml  Net    360 ml    Left LE warm well perfused Incisions healing  Assessment/Planning: POD # 8 #1: Left common femoral profunda femoral and superficial femoral artery exposure #2 left iliac, superficial femoral, profundofemoral, common femoral, and popliteal embolectomy #3 patch angioplasty, left common femoral and superficial femoral artery #4: Intraoperative arteriogram #5: Left below-knee popliteal, anterior tibial, and tibioperoneal trunk exposure #6: Left popliteal, anterior tibial, posterior tibial embolectomy #7: Arteriotomy with primary closure, left popliteal artery, and left tibioperoneal trunk   Dr. Betsy Reed: " Susan Reed appears unchanged. I discussed the situation with Susan Reed. The pathology from the repeat biopsy is pending. We discussed referring Susan for a formal second opinion at [facility]. Susan Reed reports Susan Reed does not wish to pursue another opinion or surgical intervention.  I discussed the case with Dr. Trula Slade yesterday. We will switch to Lovenox anticoagulation. Susan Reed feels she can administer the Lovenox.  The Reed is interested in a [facility] referral. I will make this referral today. Susan Reed be out of town until 07/07/2015. I think we should plan for discharge with home Hospice care on 07/07/2015.  I discussed CPR and ACLS issues with Susan Reed. She indicates Susan Reed does not wish to undergo resuscitation measures. She will be  placed on a no CODE BLUE status."  Susan Reed is out of town, other family will visit today plan for D/C possibly Mon.  Awaiting repeat biopsy results and ultimate disposition per Dr.Sherrill.   Susan Reed [facility] 07/05/2015 7:51 AM --  Laboratory Lab Results:  Recent Labs  07/03/15 0559 07/04/15 0240  WBC 22.8* 20.3*  HGB 10.5* 10.0*  HCT 31.5* 31.6*  PLT 214 206   BMET No results for input(s): NA, K, CL, CO2, GLUCOSE, BUN, CREATININE, CALCIUM in the last 72 hours.  COAG Lab Results  Component Value Date   INR 1.22 07/02/2015   INR 1.33 06/27/2015   INR 1.23 06/26/2015   PROTIME 40.8* 06/24/2015   PROTIME 27.6* 06/16/2015   PROTIME 34.8* 06/13/2015   No results found for: PTT

## 2015-07-06 NOTE — Progress Notes (Addendum)
[facility]  VASCULAR SURGERY ASSESSMENT AND PLAN:  Agree with note below. Left foot is warm and well perfused. Incision looks fine. On Lovenox Hospice consulted.   Susan Mayo, MD, FACS Beeper [phone] Office: [phone]  Subjective  - Comfortable  Objective 145/77 99 97.8 F (36.6 C) (Oral) 18 95%  Intake/Output Summary (Last 24 hours) at 07/06/15 0735 Last data filed at 07/05/15 1821  Gross per 24 hour  Intake    720 ml  Output      0 ml  Net    720 ml   Left leg incisions soft and healing well Foot warm well perfused  Assessment/Planning: POD # 9 #1: Left common femoral profunda femoral and superficial femoral artery exposure #2 left iliac, superficial femoral, profundofemoral, common femoral, and popliteal embolectomy #3 patch angioplasty, left common femoral and superficial femoral artery #4: Intraoperative arteriogram #5: Left below-knee popliteal, anterior tibial, and tibioperoneal trunk exposure #6: Left popliteal, anterior tibial, posterior tibial embolectomy #7: Arteriotomy with primary closure, left popliteal artery, and left tibioperoneal trunk   Dr. Betsy Coder: " Susan Reed appears unchanged. I discussed the situation with her daughter. The pathology from the repeat biopsy is pending. We discussed referring her for a formal second opinion at [facility]. Her daughter reports Susan Reed does not wish to pursue another opinion or surgical intervention.  I discussed the case with Dr. Trula Slade yesterday. We will switch to Lovenox anticoagulation. Her daughter feels she can administer the Lovenox.  The daughter is interested in a [facility] referral. I will make this referral today. Her daughter be out of town until 07/07/2015. I think we should plan for discharge with home Hospice care on 07/07/2015.  I discussed CPR and ACLS issues with her daughter. She indicates Susan Reed does not wish to undergo  resuscitation measures. She will be placed on a no CODE BLUE status."  Her daughter is out of town, other family will visit today plan for D/C possibly Mon.    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 A final report should be available 07/07/2015 after additional immunohistochemical stains.  Laurence Slate [facility] 07/06/2015 7:35 AM --  Laboratory Lab Results:  Recent Labs  07/04/15 0240  WBC 20.3*  HGB 10.0*  HCT 31.6*  PLT 206   BMET No results for input(s): NA, K, CL, CO2, GLUCOSE, BUN, CREATININE, CALCIUM in the last 72 hours.  COAG Lab Results  Component Value Date   INR 1.22 07/02/2015   INR 1.33 06/27/2015   INR 1.23 06/26/2015   PROTIME 40.8* 06/24/2015   PROTIME 27.6* 06/16/2015   PROTIME 34.8* 06/13/2015   No results found for: PTT

## 2015-07-06 NOTE — Progress Notes (Signed)
IP PROGRESS NOTE  Subjective:   She is alert. He complains of abdominal pain this morning.  Objective: Vital signs in last 24 hours: Blood pressure 145/77, pulse 99, temperature 97.8 F (36.6 C), temperature source Oral, resp. rate 18, height 4\' 11"  (1.499 m), weight 120 lb 9.5 oz (54.7 kg), SpO2 95 %.  Intake/Output from previous day: 02/11 0701 - 02/12 0700 In: 720 [P.O.:720] Out: -   Physical Exam:  Abdomen:   Mass filling the lower abdomen/pelvis with associated tenderness Extremities:  Surgical wounds healing, ecchymosis at left lower leg, the left foot is warm   Lab Results:  Recent Labs  07/04/15 0240  WBC 20.3*  HGB 10.0*  HCT 31.6*  PLT 206     Medications: I have reviewed the patient's current medications.  Assessment/Plan: 1. Stage IIIc (T4 N2) poorly differentiated adenocarcinoma of the cecum status post right colectomy 06/24/2011. She began adjuvant Xeloda chemotherapy on 07/26/2011. She completed the eighth and final cycle beginning 12/21/2011. 2. History of anemia. Question if related to GI bleeding at presentation and then chemotherapy. 3. COPD. 4. Family history multiple cancers. 5. History of anorexia/weight loss.  6. Report of balance difficulty and numbness in the feet when here 11/24/2011. Vtamin B 12 level in low normal range on 12/20/2011. Repeat level today was normal on 01/18/2012. 7. History of hand-foot syndrome secondary to Xeloda. Resolved. 8. History of vertigo. 9. Depression-she was started on a trial of Remeron following office visit 02/21/2012 with significant improvement. She is currently taking Remeron 7.5 mg daily. 10. Mildly elevated CEA July and August 2013-? Etiology. The CEA returned normal in December of 2013. ? related to chronic bronchitis or other inflammation. 11. CT abdomen/pelvis 05/12/2015 revealed a large pelvic mass, apparently arising from the uterus status post CT-guided biopsy 06/26/2015.  Pathology revealed limited  diagnostic material, differential diagnosis includes a leiomyoma or leiomyosarcoma. 12. Elevated prothrombin time; factor VII decreased. Received vitamin K 10 mg 06/12/2015, 06/13/2015 and 06/14/2015; PT/INR improved 06/16/2015, elevated PT/INR 06/24/2015-improved with additional vitamin K 13.  admission  06/27/2015 with an ischemic left lower leg /foot -status post an embolectomy procedure , Lovenox started 07/04/2015. 14. External hemorrhoids  Ms. Dervisevic appears stable. The plan is to discharge her to home with Hospice care on 07/07/2015. She will continue Lovenox at home.         LOS: 9 days   Betsy Coder, MD   07/06/2015, 8:25 AM

## 2015-07-07 ENCOUNTER — Telehealth: Payer: Self-pay | Admitting: *Deleted

## 2015-07-07 ENCOUNTER — Telehealth: Payer: Self-pay

## 2015-07-07 MED ORDER — BISACODYL 10 MG RE SUPP: 10.0000 mg | Freq: Every day | RECTAL | Status: DC | PRN

## 2015-07-07 MED ORDER — OXYCODONE HCL ER 20 MG PO T12A: 20.0000 mg | EXTENDED_RELEASE_TABLET | Freq: Two times a day (BID) | ORAL | Status: AC

## 2015-07-07 MED ORDER — OXYCODONE HCL 5 MG PO TABS: 5.0000 mg | ORAL_TABLET | ORAL | Status: AC | PRN

## 2015-07-07 MED ORDER — ENOXAPARIN SODIUM 150 MG/ML ~~LOC~~ SOLN: 80.0000 mg | SUBCUTANEOUS | Status: AC

## 2015-07-07 MED ORDER — POLYETHYLENE GLYCOL 3350 17 G PO PACK
17.0000 g | PACK | Freq: Every day | ORAL | Status: DC
  Administered 2015-07-07: 17 g via ORAL
  Filled 2015-07-07: qty 1

## 2015-07-07 NOTE — Progress Notes (Signed)
Utilization review completed.  

## 2015-07-07 NOTE — Care Management Note (Addendum)
Case Management Note Marvetta Gibbons RN, BSN Unit 2W-Case Manager [phone]  Patient Details  Name: Susan Reed MRN: [id number] Date of Birth: 1925/03/09  Subjective/Objective:     Pt admitted with popliteal artery embolus s/p embolectomy                Action/Plan: PTA pt lived at home with daughter- was active with [facility]- for RN/aide/sw- will need resumption orders on discharge and add PT- pt has 3n1 and hospital bed at home- NCM to follow for d/c needs/planning  Expected Discharge Date:    07/07/15              Expected Discharge Plan:  Home w Hospice Care  In-House Referral:     Discharge planning Services  CM Consult  Post Acute Care Choice:  Hospice Choice offered to:  Adult Children, Patient  DME Arranged:    DME Agency:     HH Arranged:  RN, Disease Management HH Agency:  [facility]  Status of Service:  Completed, signed off  Medicare Important Message Given:  Yes Date Medicare IM Given:    Medicare IM give by:    Date Additional Medicare IM Given:    Additional Medicare Important Message give by:     If discussed at [place] of Stay Meetings, dates discussed:    Additional Comments:  07/07/15- 1120- Marvetta Gibbons RN, BSN - pt for d/c home today- with Hospice, have spoken with Lattie Haw from [facility] and confirmed that pt will be admitted to hospice services later this afternoon around 3 pm at home- discussed that pt will need scripts for po morphine and ativan- PA- Gerri Lins aware and will write scripts for meds- also confirmed with daughter that she would like mom to transport home via non emergent EMS- after lunch- have notified CSW for ambulance transport need- gold DNR form on shadow chart for signature- bedside RN has been made aware of plans. Will arrange for transport between 1230-1:00 pm.  07/04/15- 1000- Marvetta Gibbons RN, BSN - received referral for Home with Hospice- in to speak with pt and daughter at  bedside- discussed home hospice confirmed with daughter Home Hospice choice for [name], [name], [name] Referral call to [facility] for home hospice- with plan for d/c on Monday 07/07/15. Lisa with [facility] called to confirm that referral had been received- Lisa with [facility] contact # [phone]-  Call made to [facility]- to alert them that pt going home with Hospice- was told that they did not have pt as an active pt. CM to continue to follow.    Dawayne Patricia, RN 07/07/2015, 11:22 AM

## 2015-07-07 NOTE — Discharge Summary (Signed)
[facility] Discharge Summary   Patient ID:  Susan Reed MRN: [id number] DOB/AGE: 80-May-1926 80 y.o.  Admit date: 06/27/2015 Discharge date: 07/07/2015 Date of Surgery: 06/27/2015 - 06/28/2015 Surgeon: Juliann Mule): Susan Mitchell, MD  Admission Diagnosis: Embolism, arterial, leg, left [facility]) [I74.3]  Discharge Diagnoses:  Embolism, arterial, leg, left ([place]) [I74.3]  Secondary Diagnoses: Past Medical History  Diagnosis Date  . Anemia   . COPD (chronic obstructive pulmonary disease) ([place])   . Hyperlipidemia   . Osteoporosis   . Hypertension   . Glaucoma   . Hypothyroidism   . Blood transfusion     2009  . Colon cancer ([place])     colon cancer   . Rectal bleeding   . Abdominal bloating   . Allergy 08/09/12    codeine and lactose  . Anxiety   . Depression   . Blood transfusion without reported diagnosis 2009    5 years ago for anemia  . Cataract     Procedure(s): PATCH ANGIOPLASTY EMBOLECTOMY LEFT POPLITEAL ARTERY, LEFT FEMORAL ARTERY INTRA OPERATIVE ARTERIOGRAM  Discharged Condition: good  HPI: Patient is a 82 y.o. female who presents for evaluation of left foot ischemia. Pt began to have symptoms of numbness in her left foot about 9 am today. She says it was fairly slow onset. She was seen in the [place] ER and found to have no pulse. She was transferred to [facility] ER for further eval. Currently undergoing work up for pelvic tumor ? Recurrent CA or new. 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 She has had anorexia and weight loss for several months. This was corrected for mass biopsy yesterday. Other medical problems include hypertension, hyperlipid, COPD all of which have been stable.    Hospital Course:  Susan Reed is a 82 y.o. female is S/P { Procedure(s): PATCH ANGIOPLASTY EMBOLECTOMY LEFT POPLITEAL ARTERY, LEFT FEMORAL ARTERY INTRA OPERATIVE ARTERIOGRAM   POD#1  Dr. Oneida Alar" Pt  with peroneal doppler only in left leg today as of about 1 hr ago Right foot pink warm with triphasic doppler Left foot toes dusky Some incisional ecchymosis  Pt most like has reoccluded to some extent on heparin Will reeval a little later this morning and discuss with family  I do not believe 2nd attempt at thrombectomy is going to be beneficial at this point with underlying disease and reocclusion while on heparin but will visit the issue with pt and family a little later this morning  Pt is currently asymptomatic"  Pt re examined. Left leg cool from ankle down. Discussed findings with pt and her daughter by phone. I do not feel that we are going to achieve any benefit from redo embolectomy in light of the patient's age comorbid conditions and findings intraop last evening.   We will keep heparin running for now. She remains asymptomatic but most likely will need amputation of her leg. Will continue to observe for now with plan for amputation if progressive ischemic symptoms  Will notify oncology service of pt admission as most likely she will require long term anticoagulation"  Oncology Dr. Marin Olp: Susan Reed is a very charming 82 year old [place] female. She has a past history of a stage IIIc colon cancer. This was resected. She had 5 positive lymph nodes. The tumor was MSI- high. She was treated with Xeloda.  POD#2 I would have to think that given the size of this mass and the symptomatic nature of this mass, that this  will have to come out. I suppose her gynecologic oncologist would make this decision.  Plan for 2D echo.  06/30/2015: Dr. Benay Spice  will discuss the case with GYN oncology regarding the indication for another biopsy. She may be a candidate for an embolization procedure if the large pelvic mass cannot be resected. Assessment/Plan: 1. Stage IIIc (T4 N2) poorly differentiated adenocarcinoma of the cecum status post right colectomy 06/24/2011. She began adjuvant  Xeloda chemotherapy on 07/26/2011. She completed the eighth and final cycle beginning 12/21/2011. 2. History of anemia. Question if related to GI bleeding at presentation and then chemotherapy. 3. COPD. 4. Family history multiple cancers. 5. History of anorexia/weight loss.  6. Report of balance difficulty and numbness in the feet when here 11/24/2011. Vtamin B 12 level in low normal range on 12/20/2011. Repeat level today was normal on 01/18/2012. 7. History of hand-foot syndrome secondary to Xeloda. Resolved. 8. History of vertigo. 9. Depression-she was started on a trial of Remeron following office visit 02/21/2012 with significant improvement. She is currently taking Remeron 7.5 mg daily. 10. Mildly elevated CEA July and August 2013-? Etiology. The CEA returned normal in December of 2013. ? related to chronic bronchitis or other inflammation. 11. CT abdomen/pelvis 05/12/2015 revealed a large pelvic mass, apparently arising from the uterus status post CT-guided biopsy 06/26/2015. Pathology revealed limited diagnostic material, differential diagnosis includes a leiomyoma or leiomyosarcoma. 12. Elevated prothrombin time; factor VII decreased. Received vitamin K 10 mg 06/12/2015, 06/13/2015 and 06/14/2015; PT/INR improved 06/16/2015, elevated PT/INR 06/24/2015-improved with additional vitamin K 13. admission 06/27/2015 with an ischemic left lower leg /foot -status post an embolectomy procedure , Lovenox started 07/04/2015. 14. External hemorrhoids  Susan Reed appears unchanged. She is stable for discharge to home with Hospice care. I discussed the situation with her daughter. She understands [name] will likely not live beyond a few weeks. She is concerned that it will be difficult to care for her in the home. I feel [name] is a candidate for [employer] if she is not comfortable at home. The plan is to continue Lovenox anticoagulation for treatment of the recent left lower extremity  thrombus.  We will schedule outpatient follow-up at the Cancer center for the week of 07/14/2015.  Consults:  Treatment Team:  Ladell Pier, MD  Significant Diagnostic Studies: CBC Lab Results  Component Value Date   WBC 20.3* 07/04/2015   HGB 10.0* 07/04/2015   HCT 31.6* 07/04/2015   MCV 92.7 07/04/2015   PLT 206 07/04/2015    BMET    Component Value Date/Time   NA 136 06/30/2015 0200   NA 134* 06/11/2015 1225   K 3.2* 06/30/2015 0200   K 3.4* 06/11/2015 1225   CL 104 06/30/2015 0200   CO2 19* 06/30/2015 0200   CO2 25 06/11/2015 1225   GLUCOSE 104* 06/30/2015 0200   GLUCOSE 107 06/11/2015 1225   BUN 12 06/30/2015 0200   BUN 9.9 06/11/2015 1225   CREATININE 0.60 06/30/2015 0200   CREATININE 0.6 06/11/2015 1225   CALCIUM 7.0* 06/30/2015 0200   CALCIUM 8.0* 06/11/2015 1225   GFRNONAA >60 06/30/2015 0200   GFRAA >60 06/30/2015 0200   COAG Lab Results  Component Value Date   INR 1.22 07/02/2015   INR 1.33 06/27/2015   INR 1.23 06/26/2015   PROTIME 40.8* 06/24/2015   PROTIME 27.6* 06/16/2015   PROTIME 34.8* 06/13/2015     Disposition:  Discharge to :Home    Medication List    ASK  your doctor about these medications        cycloSPORINE 0.05 % ophthalmic emulsion  Commonly known as:  RESTASIS  Place 1 drop into both eyes 2 (two) times daily.     dorzolamide-timolol 22.3-6.8 MG/ML ophthalmic solution  Commonly known as:  COSOPT  Place 1 drop into both eyes at bedtime.     Fish Oil 1000 MG Caps  Take 3,000 mg by mouth daily.     latanoprost 0.005 % ophthalmic solution  Commonly known as:  XALATAN  INSTILL 1 DROP IN BOTH EYES AT BEDTIME     levothyroxine 50 MCG tablet  Commonly known as:  SYNTHROID, LEVOTHROID  TAKE 1 TABLET EVERY TUESDAY, THURSDAY, SATURDAY AND SUNDAY     levothyroxine 75 MCG tablet  Commonly known as:  SYNTHROID, LEVOTHROID  Take 75 mcg by mouth See admin instructions. Takes on Monday, Wednesday, and Friday only.      loperamide 2 MG capsule  Commonly known as:  IMODIUM  Take 2 mg by mouth as needed for diarrhea or loose stools. Reported on 05/30/2015     megestrol 625 MG/5ML suspension  Commonly known as:  MEGACE ES  TAKE 5 MLS (625 MG TOTAL) BY MOUTH DAILY.     mirtazapine 7.5 MG tablet  Commonly known as:  REMERON  TAKE 1 TABLET BY MOUTH AT BEDTIME     multivitamin capsule  Take 1 capsule by mouth daily.     TOBRADEX ophthalmic ointment  Generic drug:  tobramycin-dexamethasone  Apply 1 application to eye daily as needed (rash).     traMADol 50 MG tablet  Commonly known as:  ULTRAM  Take 1 tablet (50 mg total) by mouth every 6 (six) hours as needed.     VITAMIN D-3 PO  Take 1,000 Units by mouth daily.     VITAMIN K PO  Take 1 tablet by mouth daily.       Verbal and written Discharge instructions given to the patient. Wound care per Discharge AVS     Follow-up Information    Follow up with Hospice at [facility].   Specialty:  Hospice and Palliative Medicine   Why:  Home Hospice arranged    Contact information:   [place] [address] [phone]       Signed: Laurence Slate [facility] 07/07/2015, 10:19 AM

## 2015-07-07 NOTE — Telephone Encounter (Signed)
Message from [place] in [facility] Admissions asking if Dr. Benay Spice will be attending for this pt. Returned call, Dr. Benay Spice will be attending. Symptom management per hospice MD. Dewaine Oats voiced understanding.

## 2015-07-07 NOTE — Progress Notes (Signed)
IP PROGRESS NOTE  Subjective:   Susan Reed appears comfortable this morning. Her daughter is at the bedside.  Objective: Vital signs in last 24 hours: Blood pressure 134/65, pulse 93, temperature 98.3 F (36.8 C), temperature source Oral, resp. rate 18, height 4\' 11"  (1.499 m), weight 120 lb 9.5 oz (54.7 kg), SpO2 95 %.  Intake/Output from previous day: 02/12 0701 - 02/13 0700 In: 120 [P.O.:120] Out: -   Physical Exam:  Abdomen:   Mass filling the lower abdomen/pelvis with associated tenderness Extremities:  Surgical wounds healing, ecchymosis at left lower leg, the left foot is warm   Lab Results: No results for input(s): WBC, HGB, HCT, PLT in the last 72 hours.   Medications: I have reviewed the patient's current medications.  Assessment/Plan: 1. Stage IIIc (T4 N2) poorly differentiated adenocarcinoma of the cecum status post right colectomy 06/24/2011. She began adjuvant Xeloda chemotherapy on 07/26/2011. She completed the eighth and final cycle beginning 12/21/2011. 2. History of anemia. Question if related to GI bleeding at presentation and then chemotherapy. 3. COPD. 4. Family history multiple cancers. 5. History of anorexia/weight loss.  6. Report of balance difficulty and numbness in the feet when here 11/24/2011. Vtamin B 12 level in low normal range on 12/20/2011. Repeat level today was normal on 01/18/2012. 7. History of hand-foot syndrome secondary to Xeloda. Resolved. 8. History of vertigo. 9. Depression-she was started on a trial of Remeron following office visit 02/21/2012 with significant improvement. She is currently taking Remeron 7.5 mg daily. 10. Mildly elevated CEA July and August 2013-? Etiology. The CEA returned normal in December of 2013. ? related to chronic bronchitis or other inflammation. 11. CT abdomen/pelvis 05/12/2015 revealed a large pelvic mass, apparently arising from the uterus status post CT-guided biopsy 06/26/2015.  Pathology revealed  limited diagnostic material, differential diagnosis includes a leiomyoma or leiomyosarcoma. 12. Elevated prothrombin time; factor VII decreased. Received vitamin K 10 mg 06/12/2015, 06/13/2015 and 06/14/2015; PT/INR improved 06/16/2015, elevated PT/INR 06/24/2015-improved with additional vitamin K 13.  admission  06/27/2015 with an ischemic left lower leg /foot -status post an embolectomy procedure , Lovenox started 07/04/2015. 14. External hemorrhoids  Ms. Madey appears unchanged. She is stable for discharge to home with Hospice care. I discussed the situation with her daughter. She understands Ms. Luten will likely not live beyond a few weeks. She is concerned that it will be difficult to care for her in the home. I feel Ms. [place] is a candidate for [employer] if she is not comfortable at home. The plan is to continue Lovenox anticoagulation for treatment of the recent left lower extremity thrombus.  We will schedule outpatient follow-up at the Cancer center for the week of 07/14/2015.         LOS: 10 days   Betsy Coder, MD   07/07/2015, 10:47 AM

## 2015-07-07 NOTE — Telephone Encounter (Signed)
Called and left a message with new follow up appointments

## 2015-07-07 NOTE — Progress Notes (Signed)
Pt has been discharged. Pt was discharged home with her daughter. Hospice will be following pt after discharge. Pt and pt's daughter received discharge instructions and all questions were answered. Pt's IV and telemetry box were removed prior to discharge. Pt left the floor via ambulance transportation and was accompanied by her daughter and EMS transporters. Pt was in no distress at time of discharge.   Grant Fontana RN, BSN

## 2015-07-07 NOTE — Progress Notes (Signed)
OT Cancellation Note  Patient Details Name: Susan Reed MRN: [id number] DOB: [birth year]   Cancelled Treatment:    Reason Eval/Treat Not Completed: Fatigue/lethargy limiting ability to participate.  Pt. Requesting to rest today and states she would like limited "visitors" in/out of the room.  Left note for PT also.  Will check back with pt. And family to determine our role in her care moving forward.  Note Hospice consult in place with d/c home likely soon.    Janice Coffin, COTA/L 07/07/2015, 8:59 AM

## 2015-07-07 NOTE — Progress Notes (Signed)
[facility] [place]  Subjective  - Pending set for discharge home with Hospice care.   Objective 134/65 93 98.3 F (36.8 C) (Oral) 18 95%  Intake/Output Summary (Last 24 hours) at 07/07/15 [id number] Last data filed at 07/06/15 [id number]  Gross per 24 hour  Intake    120 ml  Output      0 ml  Net    120 ml     Left leg incisions healing well, ecchymosis, no erythema Foot warm and well perfused   Assessment/Planning: POD # 10 #1: Left common femoral profunda femoral and superficial femoral artery exposure #2 left iliac, superficial femoral, profundofemoral, common femoral, and popliteal embolectomy #3 patch angioplasty, left common femoral and superficial femoral artery #4: Intraoperative arteriogram #5: Left below-knee popliteal, anterior tibial, and tibioperoneal trunk exposure #6: Left popliteal, anterior tibial, posterior tibial embolectomy #7: Arteriotomy with primary closure, left popliteal artery, and left tibioperoneal trunk   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 A final report should be available 07/07/2015 after additional immunohistochemical stains. Plan for hospice care at home possible discharge today  Laurence Slate [facility] 07/07/2015 8:08 AM --  Laboratory Lab Results: No results for input(s): WBC, HGB, HCT, PLT in the last 72 hours. BMET No results for input(s): NA, K, CL, CO2, GLUCOSE, BUN, CREATININE, CALCIUM in the last 72 hours.  COAG Lab Results  Component Value Date   INR 1.22 07/02/2015   INR 1.33 06/27/2015   INR 1.23 06/26/2015   PROTIME 40.8* 06/24/2015   PROTIME 27.6* 06/16/2015   PROTIME 34.8* 06/13/2015   No results found for: PTT

## 2015-07-07 NOTE — Care Management Important Message (Signed)
Important Message  Patient Details  Name: Susan Reed MRN: [id number] Date of Birth: November 07, 1924   Medicare Important Message Given:  Yes    Reece Mcbroom P Canary Fister 07/07/2015, 2:05 PM

## 2015-07-07 NOTE — Progress Notes (Signed)
CSW consulted for ambulance transport home  Patient will discharge to home Anticipated discharge date: 2/13 Family notified: pt daughter Transportation by [employer]- scheduled for 1:30pm  CSW signing off.  Domenica Reamer, [place] Social Worker [phone]

## 2015-07-11 ENCOUNTER — Telehealth: Payer: Self-pay | Admitting: Oncology

## 2015-07-11 ENCOUNTER — Other Ambulatory Visit: Payer: Self-pay | Admitting: *Deleted

## 2015-07-11 NOTE — Telephone Encounter (Signed)
per pof to CX all pt appts-pt in Hospice

## 2015-07-11 NOTE — Telephone Encounter (Signed)
PATIENT NOW UNDER HOSPICE.

## 2015-07-14 ENCOUNTER — Ambulatory Visit: Payer: Medicare Other | Admitting: Nurse Practitioner

## 2015-07-16 ENCOUNTER — Ambulatory Visit: Payer: Medicare Other | Admitting: Gastroenterology

## 2015-07-17 ENCOUNTER — Telehealth: Payer: Self-pay | Admitting: *Deleted

## 2015-07-17 NOTE — Telephone Encounter (Signed)
Call received from [place] at [facility] stating that pt passed away today.  Izora Gala wanted to thank Dr. Benay Spice for his excellent care of pt.

## 2015-07-23 DEATH — deceased

## 2015-08-02 ENCOUNTER — Other Ambulatory Visit: Payer: Self-pay | Admitting: Oncology

## 2015-10-07 ENCOUNTER — Encounter: Payer: Self-pay | Admitting: Oncology

## 2015-10-07 NOTE — Progress Notes (Signed)
Fax sent 07/01/15-- 2, I sent to medical records
